# Patient Record
Sex: Male | Born: 1965 | Hispanic: No | Marital: Married | Smoking: Former smoker
Health system: Southern US, Community
[De-identification: ages and names within clinical notes are randomized; demographics above are authoritative.]

## PROBLEM LIST (undated history)

## (undated) DIAGNOSIS — I2510 Atherosclerotic heart disease of native coronary artery without angina pectoris: Secondary | ICD-10-CM

## (undated) DIAGNOSIS — E7800 Pure hypercholesterolemia, unspecified: Secondary | ICD-10-CM

## (undated) DIAGNOSIS — E079 Disorder of thyroid, unspecified: Secondary | ICD-10-CM

## (undated) DIAGNOSIS — I219 Acute myocardial infarction, unspecified: Secondary | ICD-10-CM

## (undated) DIAGNOSIS — K611 Rectal abscess: Secondary | ICD-10-CM

## (undated) DIAGNOSIS — B009 Herpesviral infection, unspecified: Secondary | ICD-10-CM

## (undated) DIAGNOSIS — J329 Chronic sinusitis, unspecified: Secondary | ICD-10-CM

## (undated) DIAGNOSIS — L0291 Cutaneous abscess, unspecified: Secondary | ICD-10-CM

## (undated) DIAGNOSIS — I10 Essential (primary) hypertension: Secondary | ICD-10-CM

## (undated) DIAGNOSIS — N529 Male erectile dysfunction, unspecified: Secondary | ICD-10-CM

## (undated) DIAGNOSIS — F17200 Nicotine dependence, unspecified, uncomplicated: Secondary | ICD-10-CM

## (undated) DIAGNOSIS — E049 Nontoxic goiter, unspecified: Secondary | ICD-10-CM

## (undated) DIAGNOSIS — G8929 Other chronic pain: Secondary | ICD-10-CM

## (undated) DIAGNOSIS — M549 Dorsalgia, unspecified: Secondary | ICD-10-CM

## (undated) HISTORY — PX: ARM HARDWARE REMOVAL: SUR1122

## (undated) HISTORY — DX: Acute myocardial infarction, unspecified: I21.9

## (undated) HISTORY — DX: Rectal abscess: K61.1

## (undated) HISTORY — DX: Male erectile dysfunction, unspecified: N52.9

## (undated) HISTORY — DX: Nontoxic goiter, unspecified: E04.9

## (undated) HISTORY — DX: Nicotine dependence, unspecified, uncomplicated: F17.200

## (undated) HISTORY — PX: PAROTIDECTOMY: SUR1003

## (undated) HISTORY — DX: Cutaneous abscess, unspecified: L02.91

## (undated) HISTORY — DX: Atherosclerotic heart disease of native coronary artery without angina pectoris: I25.10

## (undated) HISTORY — PX: NECK SURGERY: SHX720

## (undated) HISTORY — DX: Herpesviral infection, unspecified: B00.9

---

## 2002-12-25 ENCOUNTER — Encounter: Payer: Self-pay | Admitting: General Surgery

## 2002-12-25 ENCOUNTER — Encounter (INDEPENDENT_AMBULATORY_CARE_PROVIDER_SITE_OTHER): Payer: Self-pay | Admitting: Specialist

## 2002-12-25 ENCOUNTER — Ambulatory Visit (HOSPITAL_COMMUNITY): Admission: RE | Admit: 2002-12-25 | Discharge: 2002-12-25 | Payer: Self-pay | Admitting: General Surgery

## 2003-03-31 ENCOUNTER — Ambulatory Visit (HOSPITAL_COMMUNITY): Admission: RE | Admit: 2003-03-31 | Discharge: 2003-03-31 | Payer: Self-pay | Admitting: General Surgery

## 2003-04-08 ENCOUNTER — Ambulatory Visit (HOSPITAL_COMMUNITY): Admission: RE | Admit: 2003-04-08 | Discharge: 2003-04-08 | Payer: Self-pay | Admitting: General Surgery

## 2003-04-08 ENCOUNTER — Encounter (INDEPENDENT_AMBULATORY_CARE_PROVIDER_SITE_OTHER): Payer: Self-pay | Admitting: *Deleted

## 2003-05-10 ENCOUNTER — Observation Stay (HOSPITAL_COMMUNITY): Admission: RE | Admit: 2003-05-10 | Discharge: 2003-05-11 | Payer: Self-pay | Admitting: General Surgery

## 2006-03-19 HISTORY — PX: THYROIDECTOMY, PARTIAL: SHX18

## 2006-11-13 ENCOUNTER — Inpatient Hospital Stay (HOSPITAL_COMMUNITY): Admission: EM | Admit: 2006-11-13 | Discharge: 2006-11-20 | Payer: Self-pay | Admitting: Emergency Medicine

## 2007-07-11 ENCOUNTER — Emergency Department (HOSPITAL_COMMUNITY): Admission: EM | Admit: 2007-07-11 | Discharge: 2007-07-12 | Payer: Self-pay | Admitting: Emergency Medicine

## 2007-10-10 ENCOUNTER — Emergency Department (HOSPITAL_COMMUNITY): Admission: EM | Admit: 2007-10-10 | Discharge: 2007-10-10 | Payer: Self-pay | Admitting: Emergency Medicine

## 2008-10-09 ENCOUNTER — Emergency Department (HOSPITAL_COMMUNITY): Admission: EM | Admit: 2008-10-09 | Discharge: 2008-10-09 | Payer: Self-pay | Admitting: Emergency Medicine

## 2009-04-07 ENCOUNTER — Emergency Department (HOSPITAL_BASED_OUTPATIENT_CLINIC_OR_DEPARTMENT_OTHER): Admission: EM | Admit: 2009-04-07 | Discharge: 2009-04-07 | Payer: Self-pay | Admitting: Emergency Medicine

## 2009-04-07 ENCOUNTER — Ambulatory Visit: Payer: Self-pay | Admitting: Diagnostic Radiology

## 2009-05-22 ENCOUNTER — Emergency Department (HOSPITAL_BASED_OUTPATIENT_CLINIC_OR_DEPARTMENT_OTHER): Admission: EM | Admit: 2009-05-22 | Discharge: 2009-05-22 | Payer: Self-pay | Admitting: Emergency Medicine

## 2009-06-04 ENCOUNTER — Emergency Department (HOSPITAL_COMMUNITY): Admission: EM | Admit: 2009-06-04 | Discharge: 2009-06-05 | Payer: Self-pay | Admitting: Emergency Medicine

## 2009-06-25 ENCOUNTER — Emergency Department (HOSPITAL_COMMUNITY): Admission: EM | Admit: 2009-06-25 | Discharge: 2009-06-25 | Payer: Self-pay | Admitting: Emergency Medicine

## 2009-07-15 ENCOUNTER — Emergency Department (HOSPITAL_BASED_OUTPATIENT_CLINIC_OR_DEPARTMENT_OTHER): Admission: EM | Admit: 2009-07-15 | Discharge: 2009-07-15 | Payer: Self-pay | Admitting: Emergency Medicine

## 2009-07-21 ENCOUNTER — Ambulatory Visit: Payer: Self-pay | Admitting: Family

## 2009-07-21 DIAGNOSIS — Z8639 Personal history of other endocrine, nutritional and metabolic disease: Secondary | ICD-10-CM | POA: Insufficient documentation

## 2009-07-21 DIAGNOSIS — Z862 Personal history of diseases of the blood and blood-forming organs and certain disorders involving the immune mechanism: Secondary | ICD-10-CM

## 2009-07-21 DIAGNOSIS — M546 Pain in thoracic spine: Secondary | ICD-10-CM

## 2009-08-20 ENCOUNTER — Emergency Department (HOSPITAL_COMMUNITY): Admission: EM | Admit: 2009-08-20 | Discharge: 2009-08-20 | Payer: Self-pay | Admitting: Emergency Medicine

## 2009-10-07 ENCOUNTER — Emergency Department (HOSPITAL_BASED_OUTPATIENT_CLINIC_OR_DEPARTMENT_OTHER): Admission: EM | Admit: 2009-10-07 | Discharge: 2009-10-07 | Payer: Self-pay | Admitting: Emergency Medicine

## 2009-11-06 ENCOUNTER — Emergency Department (HOSPITAL_BASED_OUTPATIENT_CLINIC_OR_DEPARTMENT_OTHER): Admission: EM | Admit: 2009-11-06 | Discharge: 2009-11-06 | Payer: Self-pay | Admitting: Emergency Medicine
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
## 2010-06-11 LAB — POCT I-STAT, CHEM 8
BUN: 25 mg/dL — ABNORMAL HIGH (ref 6–23)
Calcium, Ion: 1.12 mmol/L (ref 1.12–1.32)
Chloride: 102 meq/L (ref 96–112)
Creatinine, Ser: 0.9 mg/dL (ref 0.4–1.5)
Glucose, Bld: 88 mg/dL (ref 70–99)
HCT: 48 % (ref 39.0–52.0)
Hemoglobin: 16.3 g/dL (ref 13.0–17.0)
Potassium: 3 meq/L — ABNORMAL LOW (ref 3.5–5.1)
Sodium: 139 meq/L (ref 135–145)
TCO2: 31 mmol/L (ref 0–100)
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
## 2010-12-29 LAB — BASIC METABOLIC PANEL
Chloride: 97
GFR calc non Af Amer: >60
Glucose, Bld: 100 — ABNORMAL HIGH
Potassium: 3 — ABNORMAL LOW
Sodium: 136

## 2010-12-29 LAB — DIFFERENTIAL
Basophils Relative: 1
Lymphocytes Relative: 19
Monocytes Relative: 5
Neutro Abs: 10.2 — ABNORMAL HIGH
Neutrophils Relative %: 75

## 2010-12-29 LAB — CBC
HCT: 38.6 — ABNORMAL LOW
Hemoglobin: 13.3
MCHC: 34.1
RBC: 4.79
RDW: 14.1 — ABNORMAL HIGH
WBC: 10
WBC: 13.7 — ABNORMAL HIGH

## 2011-05-10 ENCOUNTER — Emergency Department (HOSPITAL_BASED_OUTPATIENT_CLINIC_OR_DEPARTMENT_OTHER)
Admission: EM | Admit: 2011-05-10 | Discharge: 2011-05-10 | Disposition: A | Discharge status: Home Health | Payer: No Typology Code available for payment source | Attending: Emergency Medicine | Admitting: Emergency Medicine

## 2011-05-10 ENCOUNTER — Encounter (HOSPITAL_BASED_OUTPATIENT_CLINIC_OR_DEPARTMENT_OTHER): Payer: Self-pay

## 2011-05-10 ENCOUNTER — Emergency Department (INDEPENDENT_AMBULATORY_CARE_PROVIDER_SITE_OTHER): Payer: No Typology Code available for payment source

## 2011-05-10 DIAGNOSIS — R221 Localized swelling, mass and lump, neck: Secondary | ICD-10-CM

## 2011-05-10 DIAGNOSIS — H5710 Ocular pain, unspecified eye: Secondary | ICD-10-CM

## 2011-05-10 DIAGNOSIS — J328 Other chronic sinusitis: Secondary | ICD-10-CM

## 2011-05-10 DIAGNOSIS — F17200 Nicotine dependence, unspecified, uncomplicated: Secondary | ICD-10-CM | POA: Insufficient documentation

## 2011-05-10 DIAGNOSIS — I10 Essential (primary) hypertension: Secondary | ICD-10-CM | POA: Insufficient documentation

## 2011-05-10 DIAGNOSIS — R51 Headache: Secondary | ICD-10-CM | POA: Insufficient documentation

## 2011-05-10 DIAGNOSIS — Y92410 Unspecified street and highway as the place of occurrence of the external cause: Secondary | ICD-10-CM | POA: Insufficient documentation

## 2011-05-10 DIAGNOSIS — T148XXA Other injury of unspecified body region, initial encounter: Secondary | ICD-10-CM

## 2011-05-10 DIAGNOSIS — E7800 Pure hypercholesterolemia, unspecified: Secondary | ICD-10-CM | POA: Insufficient documentation

## 2011-05-10 HISTORY — DX: Essential (primary) hypertension: I10

## 2011-05-10 HISTORY — DX: Pure hypercholesterolemia, unspecified: E78.00
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
## 2011-05-10 NOTE — Discharge Instructions (Signed)
Contusion A contusion is a deep bruise. Bruises happen when an injury causes bleeding under the skin. Signs of bruising include pain, puffiness (swelling), and discolored skin. The bruise may turn blue, purple, or yellow. HOME CARE   Rest the injured area until the pain and puffiness are better.   Try to limit use of the injured area as much as possible or as told by your doctor.   Put ice on the injured area.   Put ice in a plastic bag.   Place a towel between your skin and the bag.   Leave the ice on for 15 to 20 minutes, 3 to 4 times a day.   Raise (elevate) the injured area above the level of the heart.   Use an elastic bandage to lessen puffiness and motion.   Only take medicine as told by your doctor.   Eat healthy.   See your doctor for a follow-up visit.  GET HELP RIGHT AWAY IF:   There is more redness, puffiness, or pain.   You have a headache, muscle ache, or you feel dizzy and ill.   You have a fever.   The pain is not controlled with medicine.   The bruise is not getting better.   There is yellowish white fluid (pus) coming from the wound.   You lose feeling (numbness) in the injured area.   The bruised area feels cold.   There are new problems.  MAKE SURE YOU:   Understand these instructions.   Will watch your condition.   Will get help right away if you are not doing well or get worse.  Document Released: 08/22/2007 Document Revised: 11/15/2010 Document Reviewed: 08/22/2007 [employer] Patient Information 2012 [employer]. 

## 2012-03-04 ENCOUNTER — Other Ambulatory Visit: Payer: Self-pay | Admitting: Physician Assistant

## 2012-03-05 ENCOUNTER — Ambulatory Visit: Payer: Self-pay | Admitting: Emergency Medicine

## 2012-03-05 VITALS — BP 132/100 | HR 84 | Temp 99.0°F | Resp 20 | Ht 71.0 in | Wt 207.2 lb

## 2012-03-05 DIAGNOSIS — A6000 Herpesviral infection of urogenital system, unspecified: Secondary | ICD-10-CM

## 2012-03-05 MED ORDER — VALACYCLOVIR HCL 1 G PO TABS: 1000.0000 mg | ORAL_TABLET | Freq: Every day | ORAL | Status: DC

## 2012-03-05 NOTE — Progress Notes (Signed)
[facility] [address] [place] [address] [phone]  Date:  03/05/2012   Name:  Corey Hale   DOB:  Feb 28, 1966   MRN:  [id number]  PCP:  No primary provider on file.    Chief Complaint: Medication Refill   History of Present Illness:  Corey Hale is a 46 y.o. very pleasant male patient who presents with the following:  20 year history of genital herpes.  Has been on medication intermittently and has been off for over a year.  Now has itching on his penis.  No overt eruption.  No urethral discharge or rash.  No other complaints.  Patient Active Problem List  Diagnosis  . BACK PAIN  . THYROID NODULE, HX OF    Past Medical History  Diagnosis Date  . Hypertension   . Hypercholesteremia     History reviewed. No pertinent past surgical history.  History  Substance Use Topics  . Smoking status: Current Some Day Smoker    Types: Cigarettes  . Smokeless tobacco: Not on file  . Alcohol Use: No    No family history on file.  No Known Allergies  Medication list has been reviewed and updated.  Current Outpatient Prescriptions on File Prior to Visit  Medication Sig Dispense Refill  . atenolol-chlorthalidone (TENORETIC) 50-25 MG per tablet Take 1 tablet by mouth daily.      Marland Kitchen oxyCODONE-acetaminophen (PERCOCET) 10-325 MG per tablet Take 1 tablet by mouth every 6 (six) hours as needed. For pain        Review of Systems:  As per HPI, otherwise negative.    Physical Examination: Filed Vitals:   03/05/12 1624  BP: 132/100  Pulse: 84  Temp: 99 F (37.2 C)  Resp: 20   Filed Vitals:   03/05/12 1624  Height: 5\' 11"  (1.803 m)  Weight: 207 lb 3.2 oz (93.985 kg)   Body mass index is 28.90 kg/(m^2). Ideal Body Weight: Weight in (lb) to have BMI = 25: 178.9    GEN: WDWN, NAD, Non-toxic, Alert & Oriented x 3 HEENT: Atraumatic, Normocephalic.  Ears and Nose: No external deformity. EXTR: No clubbing/cyanosis/edema NEURO: Normal gait.   PSYCH: Normally interactive. Conversant. Not depressed or anxious appearing.  Calm demeanor.  Genitalia:  Normal male  Assessment and Plan: Genital herpes Valtrex Follow up as needed  Carmelina Dane, MD

## 2012-03-05 NOTE — Telephone Encounter (Signed)
Chart pulled to PA pool at nurses station DOS 12/19/10

## 2012-06-10 ENCOUNTER — Encounter (HOSPITAL_COMMUNITY): Payer: Self-pay

## 2012-06-10 ENCOUNTER — Emergency Department (HOSPITAL_COMMUNITY)
Admission: EM | Admit: 2012-06-10 | Discharge: 2012-06-10 | Disposition: A | Discharge status: Home / Self Care | Payer: No Typology Code available for payment source | Source: Home / Self Care | Attending: Family Medicine | Admitting: Family Medicine

## 2012-06-10 DIAGNOSIS — Z8639 Personal history of other endocrine, nutritional and metabolic disease: Secondary | ICD-10-CM

## 2012-06-10 DIAGNOSIS — N529 Male erectile dysfunction, unspecified: Secondary | ICD-10-CM | POA: Diagnosis present

## 2012-06-10 DIAGNOSIS — E785 Hyperlipidemia, unspecified: Secondary | ICD-10-CM | POA: Diagnosis present

## 2012-06-10 DIAGNOSIS — Z720 Tobacco use: Secondary | ICD-10-CM | POA: Diagnosis present

## 2012-06-10 DIAGNOSIS — I10 Essential (primary) hypertension: Secondary | ICD-10-CM | POA: Diagnosis present

## 2012-06-10 DIAGNOSIS — K029 Dental caries, unspecified: Secondary | ICD-10-CM | POA: Diagnosis present

## 2012-06-10 DIAGNOSIS — F17200 Nicotine dependence, unspecified, uncomplicated: Secondary | ICD-10-CM

## 2012-06-10 HISTORY — DX: Disorder of thyroid, unspecified: E07.9

## 2012-06-10 LAB — HEMOGLOBIN A1C
Hgb A1c MFr Bld: 5.7 % — ABNORMAL HIGH (ref <5.7)
Mean Plasma Glucose: 117 mg/dL — ABNORMAL HIGH (ref <117)

## 2012-06-10 LAB — CHOLESTEROL, TOTAL: Cholesterol: 178 mg/dL (ref 0–200)

## 2012-06-10 LAB — CBC
HCT: 42.1 % (ref 39.0–52.0)
Hemoglobin: 14.6 g/dL (ref 13.0–17.0)
MCH: 29.1 pg (ref 26.0–34.0)
MCHC: 34.7 g/dL (ref 30.0–36.0)
RDW: 13.6 % (ref 11.5–15.5)

## 2012-06-10 LAB — TSH: TSH: 1.392 u[IU]/mL (ref 0.350–4.500)

## 2012-06-10 LAB — COMPREHENSIVE METABOLIC PANEL
BUN: 12 mg/dL (ref 6–23)
Calcium: 9.7 mg/dL (ref 8.4–10.5)
GFR calc Af Amer: >90 mL/min (ref >90)
Glucose, Bld: 112 mg/dL — ABNORMAL HIGH (ref 70–99)
Total Protein: 8.2 g/dL (ref 6.0–8.3)

## 2012-06-10 MED ORDER — ATENOLOL-CHLORTHALIDONE 50-25 MG PO TABS: 1.0000 | ORAL_TABLET | Freq: Every day | ORAL | Status: DC

## 2012-06-10 MED ORDER — PRAVASTATIN SODIUM 10 MG PO TABS: 10.0000 mg | ORAL_TABLET | Freq: Every day | ORAL | Status: DC

## 2012-06-10 MED ORDER — SILDENAFIL CITRATE 50 MG PO TABS: 50.0000 mg | ORAL_TABLET | Freq: Every day | ORAL | Status: DC | PRN

## 2012-06-10 NOTE — ED Notes (Signed)
Patient here to establish himself with primary doctor

## 2012-06-10 NOTE — ED Provider Notes (Signed)
History     CSN: [id number]  Arrival date & time 06/10/12  1550   First MD Initiated Contact with Patient 06/10/12 1646      Chief Complaint  Patient presents with  . Establish Care   (Consider location/radiation/quality/duration/timing/severity/associated sxs/prior treatment) HPI Pt presenting to establish care  Past Medical History  Diagnosis Date  . History of goiter  Hypertension Erectile Dysfunction Tobacco User    Pt says that he needs to see a dentist.  He says that he is having occasional erection dysfunction and has been taking viagra in the past with some success and asking for refill.  He also needs refills for his medications for his blood pressure and cholesterol.  Pt says that he has no chest pain or shortness of breath.    History reviewed. No pertinent past surgical history. Past Medical History  Diagnosis Date  . Thyroid disease    No family history on file.  History  Substance Use Topics  . Smoking status: Light Tobacco Smoker  . Smokeless tobacco: Not on file  . Alcohol Use: No    Review of Systems  Genitourinary: Negative for dysuria, frequency, discharge, penile swelling, scrotal swelling, genital sores, penile pain and testicular pain.  All other systems reviewed and are negative.    Allergies  Review of patient's allergies indicates no known allergies.  Home Medications   Current Outpatient Rx  Name  Route  Sig  Dispense  Refill  . atenolol-chlorthalidone (TENORETIC) 50-25 MG per tablet   Oral   Take 1 tablet by mouth daily.         . pravastatin (PRAVACHOL) 10 MG tablet   Oral   Take 10 mg by mouth daily.           BP 120/85  Pulse 73  Temp(Src) 97.8 F (36.6 C) (Oral)  SpO2 97%  Physical Exam  Nursing note and vitals reviewed. Constitutional: He is oriented to person, place, and time. He appears well-developed and well-nourished. No distress.  HENT:  Head: Normocephalic and atraumatic.  Nose: Nose normal.   Mouth/Throat: Oropharynx is clear and moist. No oropharyngeal exudate.  Eyes: Conjunctivae and EOM are normal. Pupils are equal, round, and reactive to light.  Neck: Normal range of motion. Neck supple. No JVD present. No tracheal deviation present. No thyromegaly present.  Cardiovascular: Normal rate, regular rhythm and normal heart sounds.   No murmur heard. Pulmonary/Chest: Effort normal and breath sounds normal.  Abdominal: Soft. Bowel sounds are normal. He exhibits no distension and no mass. There is no tenderness. There is no rebound and no guarding.  Musculoskeletal: Normal range of motion. He exhibits no edema and no tenderness.  Lymphadenopathy:    He has no cervical adenopathy.  Neurological: He is alert and oriented to person, place, and time. No cranial nerve deficit.  Skin: Skin is warm and dry. No rash noted. No erythema. No pallor.  Psychiatric: He has a normal mood and affect. His behavior is normal. Judgment and thought content normal.    ED Course  Procedures (including critical care time)  Labs Reviewed - No data to display No results found.   No diagnosis found.   MDM  IMPRESSION  Hypertension  Hyperipidemia  Erectile dysfunction, controlled with viagra prn  Large sebaceous cyst on right jaw  Dental caries  Tobacco use   RECOMMENDATIONS / PLAN Check labs today Refilled regular medications today Pt declined flu vaccine The patient was counseled on the dangers of tobacco use, and  was advised to quit.  Reviewed strategies to maximize success, including removing cigarettes and smoking materials from environment, stress management, substitution of other forms of reinforcement, support of family/friends and written materials.  Referral to general plastic surgery for removal of large sebaceous cyst right jaw Referral for dental care  FOLLOW UP 3 months for follow up   The patient was given clear instructions to go to ER or return to medical center if  symptoms don't improve, worsen or new problems develop.  The patient verbalized understanding.  The patient was told to call to get lab results if they haven't heard anything in the next week.    Addendum:  I received a call about pt's potassium being low.  Will prescribe for patient to start taking potassium KCL 20 meq po daily.  Follow up for repeat BMP in 1 week.    Results for orders placed during the hospital encounter of 06/10/12  CBC      Result Value Range   WBC 8.1  4.0 - 10.5 K/uL   RBC 5.02  4.22 - 5.81 MIL/uL   Hemoglobin 14.6  13.0 - 17.0 g/dL   HCT 40.9  81.1 - 91.4 %   MCV 83.9  78.0 - 100.0 fL   MCH 29.1  26.0 - 34.0 pg   MCHC 34.7  30.0 - 36.0 g/dL   RDW 78.2  95.6 - 21.3 %   Platelets 194  150 - 400 K/uL  COMPREHENSIVE METABOLIC PANEL      Result Value Range   Sodium 139  135 - 145 mEq/L   Potassium 2.6 (*) 3.5 - 5.1 mEq/L   Chloride 99  96 - 112 mEq/L   CO2 29  19 - 32 mEq/L   Glucose, Bld 112 (*) 70 - 99 mg/dL   BUN 12  6 - 23 mg/dL   Creatinine, Ser 0.86  0.50 - 1.35 mg/dL   Calcium 9.7  8.4 - 57.8 mg/dL   Total Protein 8.2  6.0 - 8.3 g/dL   Albumin 4.0  3.5 - 5.2 g/dL   AST 17  0 - 37 U/L   ALT 19  0 - 53 U/L   Alkaline Phosphatase 81  39 - 117 U/L   Total Bilirubin 0.4  0.3 - 1.2 mg/dL   GFR calc non Af Amer >90  >90 mL/min   GFR calc Af Amer >90  >90 mL/min  CHOLESTEROL, TOTAL      Result Value Range   Cholesterol 178  0 - 200 mg/dL  HEMOGLOBIN I6N      Result Value Range   Hemoglobin A1C 5.7 (*) <5.7 %   Mean Plasma Glucose 117 (*) <117 mg/dL  TSH      Result Value Range   TSH 1.392  0.350 - 4.500 uIU/mL  VITAMIN D 25 HYDROXY      Result Value Range   Vit D, 25-Hydroxy 33  30 - 89 ng/mL  VITAMIN B12      Result Value Range   Vitamin B-12 492  211 - 911 pg/mL           Yarelli Decelles Cyndie Mull, MD 06/11/12 [phone]

## 2012-06-11 ENCOUNTER — Telehealth (HOSPITAL_COMMUNITY): Payer: Self-pay

## 2012-06-11 NOTE — ED Notes (Signed)
Spoke with patient and gave him his lab results Prescription for kcl called into [facility] on [address] KCL 20 meq- take 1 po BID for 3 days  Than take 1 po daily #30 with three refills

## 2012-06-11 NOTE — Progress Notes (Signed)
Quick Note:  Please inform patient that his potassium level is low. He needs to take potassium everyday with his blood pressure medication. Please call in KCL 20 meq - take 1 po bid for 3 days, then take 1 po daily, #30 tabs, RFx3, Have him return to clinic in 1 week to repeat BMP and magnesium level. Pt has prediabetes. Please mail him some information on diabetes diet and physical activity. Please tell patient that his other labs came back OK. He needs to have his labs redone in 3 months but must return next week to get the labs mentioned above.   Corey Langton, MD, CDE, FAAFP [facility] [place], [place]   ______

## 2012-06-12 ENCOUNTER — Encounter (HOSPITAL_COMMUNITY): Payer: Self-pay

## 2012-06-12 NOTE — ED Notes (Signed)
Referral faxed to plastic surgeon/general sugeon for cyst on face

## 2012-06-12 NOTE — ED Notes (Signed)
Referral faxed to [facility] waiting for an appt 

## 2012-09-08 ENCOUNTER — Emergency Department (HOSPITAL_BASED_OUTPATIENT_CLINIC_OR_DEPARTMENT_OTHER)
Admission: EM | Admit: 2012-09-08 | Discharge: 2012-09-08 | Disposition: A | Discharge status: Home / Self Care | Payer: No Typology Code available for payment source | Attending: Emergency Medicine | Admitting: Emergency Medicine

## 2012-09-08 ENCOUNTER — Encounter (HOSPITAL_BASED_OUTPATIENT_CLINIC_OR_DEPARTMENT_OTHER): Payer: Self-pay

## 2012-09-08 ENCOUNTER — Emergency Department (HOSPITAL_BASED_OUTPATIENT_CLINIC_OR_DEPARTMENT_OTHER): Payer: No Typology Code available for payment source

## 2012-09-08 ENCOUNTER — Telehealth (HOSPITAL_COMMUNITY): Payer: Self-pay | Admitting: Emergency Medicine

## 2012-09-08 DIAGNOSIS — E7800 Pure hypercholesterolemia, unspecified: Secondary | ICD-10-CM | POA: Insufficient documentation

## 2012-09-08 DIAGNOSIS — Z862 Personal history of diseases of the blood and blood-forming organs and certain disorders involving the immune mechanism: Secondary | ICD-10-CM | POA: Insufficient documentation

## 2012-09-08 DIAGNOSIS — F17200 Nicotine dependence, unspecified, uncomplicated: Secondary | ICD-10-CM | POA: Insufficient documentation

## 2012-09-08 DIAGNOSIS — Z79899 Other long term (current) drug therapy: Secondary | ICD-10-CM | POA: Insufficient documentation

## 2012-09-08 DIAGNOSIS — J209 Acute bronchitis, unspecified: Secondary | ICD-10-CM | POA: Insufficient documentation

## 2012-09-08 DIAGNOSIS — I10 Essential (primary) hypertension: Secondary | ICD-10-CM | POA: Insufficient documentation

## 2012-09-08 DIAGNOSIS — Z8639 Personal history of other endocrine, nutritional and metabolic disease: Secondary | ICD-10-CM | POA: Insufficient documentation

## 2012-09-08 DIAGNOSIS — J40 Bronchitis, not specified as acute or chronic: Secondary | ICD-10-CM

## 2012-09-08 MED ORDER — AZITHROMYCIN 250 MG PO TABS: ORAL_TABLET | ORAL | Status: DC

## 2012-09-08 MED ORDER — ALBUTEROL SULFATE HFA 108 (90 BASE) MCG/ACT IN AERS
2.0000 | INHALATION_SPRAY | RESPIRATORY_TRACT | Status: DC | PRN
  Administered 2012-09-08: 2 via RESPIRATORY_TRACT
  Filled 2012-09-08: qty 6.7

## 2012-09-08 MED ORDER — DEXAMETHASONE 4 MG PO TABS
10.0000 mg | ORAL_TABLET | Freq: Once | ORAL | Status: AC
  Administered 2012-09-08: 10 mg via ORAL
  Filled 2012-09-08: qty 3

## 2012-09-08 MED ORDER — IPRATROPIUM BROMIDE 0.02 % IN SOLN
0.5000 mg | Freq: Once | RESPIRATORY_TRACT | Status: AC
  Administered 2012-09-08: 0.5 mg via RESPIRATORY_TRACT
  Filled 2012-09-08: qty 2.5

## 2012-09-08 MED ORDER — AEROCHAMBER PLUS W/MASK MISC
1.0000 | Freq: Once | Status: DC
  Filled 2012-09-08: qty 1

## 2012-09-08 MED ORDER — ALBUTEROL SULFATE (5 MG/ML) 0.5% IN NEBU
5.0000 mg | INHALATION_SOLUTION | Freq: Once | RESPIRATORY_TRACT | Status: AC
  Administered 2012-09-08: 5 mg via RESPIRATORY_TRACT
  Filled 2012-09-08: qty 1

## 2012-09-08 NOTE — ED Notes (Signed)
Patient here with cough and congestion x 2 days. Reports that he has noticed wheezing with same, no distress. Smoker. Cough worse when lying down. Dry cough on arrival

## 2012-09-08 NOTE — ED Provider Notes (Signed)
History     CSN: [id number]  Arrival date & time 09/08/12  0355   First MD Initiated Contact with Patient 09/08/12 [phone]      Chief Complaint  Patient presents with  . Cough    (Consider location/radiation/quality/duration/timing/severity/associated sxs/prior treatment) HPI This is a 47 year old smoker who complains of a two-day history of cough, productive of clear sputum and wheezing. The symptoms are worse when lying flat and improved with sitting upright. They're not worsened by exertion. Is not aware of having a fever. He denies nausea, vomiting or diarrhea. He denies past history of wheezing. The symptoms are moderate.  He has a mass of the right cheek that he states has been present and stable for the past 5 years.  Past Medical History  Diagnosis Date  . Hypertension   . Hypercholesteremia   . Thyroid disease     History reviewed. No pertinent past surgical history.  No family history on file.  History  Substance Use Topics  . Smoking status: Light Tobacco Smoker  . Smokeless tobacco: Not on file  . Alcohol Use: No      Review of Systems  All other systems reviewed and are negative.    Allergies  Review of patient's allergies indicates no known allergies.  Home Medications   Current Outpatient Rx  Name  Route  Sig  Dispense  Refill  . atenolol-chlorthalidone (TENORETIC) 50-25 MG per tablet   Oral   Take 1 tablet by mouth daily.         Marland Kitchen atenolol-chlorthalidone (TENORETIC) 50-25 MG per tablet   Oral   Take 1 tablet by mouth daily.   30 tablet   3   . oxyCODONE-acetaminophen (PERCOCET) 10-325 MG per tablet   Oral   Take 1 tablet by mouth every 6 (six) hours as needed. For pain         . pravastatin (PRAVACHOL) 10 MG tablet   Oral   Take 1 tablet (10 mg total) by mouth daily.   30 tablet   3   . sildenafil (VIAGRA) 50 MG tablet   Oral   Take 1 tablet (50 mg total) by mouth daily as needed for erectile dysfunction.   10 tablet   0    . valACYclovir (VALTREX) 1000 MG tablet   Oral   Take 1 tablet (1,000 mg total) by mouth daily.   30 tablet   12     BP 123/86  Pulse 100  Temp(Src) 99.1 F (37.3 C) (Oral)  Resp 16  Wt 204 lb (92.534 kg)  BMI 28.46 kg/m2  SpO2 97%  Physical Exam General: Well-developed, well-nourished male in no acute distress; appearance consistent with age of record HENT: normocephalic, atraumatic; nontender, solid, motile mass overlying right parotid gland Eyes: pupils equal round and reactive to light; extraocular muscles intact Neck: supple Heart: regular rate and rhythm Lungs: Wheezing on expiration; frequent cough Abdomen: soft; nondistended; nontender; bowel sounds present Extremities: No deformity; full range of motion; pulses normal; no edema Neurologic: Awake, alert and oriented; motor function intact in all extremities and symmetric; no facial droop Skin: Warm and dry Psychiatric: Normal mood and affect    ED Course  Procedures (including critical care time)     MDM  Nursing notes and vitals signs, including pulse oximetry, reviewed.  Summary of this visit's results, reviewed by myself:  Imaging Studies: Dg Chest 2 View  09/08/2012   *RADIOLOGY REPORT*  Clinical Data: Cough, congestion.  CHEST - 2 VIEW  Comparison: None.  Findings: Heart and mediastinal contours are within normal limits. No focal opacities or effusions.  No acute bony abnormality.  IMPRESSION: No active cardiopulmonary disease.   Original Report Authenticated By: Charlett Nose, M.D.    5:25 AM Air movement improved, patient feels better though wheezing persists after albuterol and Atrovent treatment. Due to productive cough and low-grade fever we will treat him for bronchitis. He was advised to stop smoking.       Hanley Seamen, MD 09/08/12 [phone]

## 2012-12-11 ENCOUNTER — Ambulatory Visit: Payer: Self-pay | Attending: Internal Medicine

## 2013-05-18 ENCOUNTER — Ambulatory Visit: Payer: No Typology Code available for payment source | Attending: Internal Medicine

## 2013-08-05 ENCOUNTER — Ambulatory Visit: Payer: Self-pay | Admitting: Emergency Medicine

## 2013-08-05 VITALS — BP 120/82 | HR 115 | Temp 97.8°F | Resp 18 | Ht 71.0 in | Wt 197.0 lb

## 2013-08-05 DIAGNOSIS — R220 Localized swelling, mass and lump, head: Secondary | ICD-10-CM

## 2013-08-05 DIAGNOSIS — R221 Localized swelling, mass and lump, neck: Secondary | ICD-10-CM

## 2013-08-05 DIAGNOSIS — G894 Chronic pain syndrome: Secondary | ICD-10-CM

## 2013-08-05 MED ORDER — OXYCODONE HCL 15 MG PO TABS: 15.0000 mg | ORAL_TABLET | Freq: Four times a day (QID) | ORAL | Status: DC | PRN

## 2013-08-05 NOTE — Addendum Note (Signed)
Addended by: Roselee Culver on: 08/05/2013 02:08 PM   Modules accepted: Orders
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
## 2013-08-11 ENCOUNTER — Telehealth: Payer: Self-pay | Admitting: Physician Assistant

## 2013-08-11 NOTE — Telephone Encounter (Signed)
Got a call from Clarissa from [facility] about his referral to plastic surgery.  She states that he has insurance through the [employer] card - [employer] and they do not participate unless  It has been approved and she wanted to know whether it had been approved or not.  I spoke with Olen Pel and she stated that the patient needs to be referred from the PCP on his orange card.  I have spoke with Joann about the orange card and he has an appt today and she will contact the patient and see if he wants to pay out of pocket or wait and get referral through his PCP on his orange card.

## 2013-09-07 ENCOUNTER — Ambulatory Visit: Payer: No Typology Code available for payment source | Attending: Internal Medicine | Admitting: Internal Medicine

## 2013-09-07 ENCOUNTER — Encounter: Payer: Self-pay | Admitting: Internal Medicine

## 2013-09-07 VITALS — BP 121/87 | HR 75 | Temp 98.3°F | Resp 16 | Ht 73.0 in | Wt 192.0 lb

## 2013-09-07 DIAGNOSIS — H9193 Unspecified hearing loss, bilateral: Secondary | ICD-10-CM

## 2013-09-07 DIAGNOSIS — I10 Essential (primary) hypertension: Secondary | ICD-10-CM | POA: Insufficient documentation

## 2013-09-07 DIAGNOSIS — Z0000 Encounter for general adult medical examination without abnormal findings: Secondary | ICD-10-CM | POA: Insufficient documentation

## 2013-09-07 DIAGNOSIS — D170 Benign lipomatous neoplasm of skin and subcutaneous tissue of head, face and neck: Secondary | ICD-10-CM | POA: Insufficient documentation

## 2013-09-07 DIAGNOSIS — F17200 Nicotine dependence, unspecified, uncomplicated: Secondary | ICD-10-CM | POA: Insufficient documentation

## 2013-09-07 DIAGNOSIS — H9190 Unspecified hearing loss, unspecified ear: Secondary | ICD-10-CM | POA: Insufficient documentation

## 2013-09-07 DIAGNOSIS — D1779 Benign lipomatous neoplasm of other sites: Secondary | ICD-10-CM | POA: Insufficient documentation

## 2013-09-07 NOTE — Patient Instructions (Signed)
Hypertension Hypertension, commonly called high blood pressure, is when the force of blood pumping through your arteries is too strong. Your arteries are the blood vessels that carry blood from your heart throughout your body. A blood pressure reading consists of a higher number over a lower number, such as 110/72. The higher number (systolic) is the pressure inside your arteries when your heart pumps. The lower number (diastolic) is the pressure inside your arteries when your heart relaxes. Ideally you want your blood pressure below 120/80. Hypertension forces your heart to work harder to pump blood. Your arteries may become narrow or stiff. Having hypertension puts you at risk for heart disease, stroke, and other problems.  RISK FACTORS Some risk factors for high blood pressure are controllable. Others are not.  Risk factors you cannot control include:   Race. You may be at higher risk if you are African American.  Age. Risk increases with age.  Gender. Men are at higher risk than women before age 45 years. After age 65, women are at higher risk than men. Risk factors you can control include:  Not getting enough exercise or physical activity.  Being overweight.  Getting too much fat, sugar, calories, or salt in your diet.  Drinking too much alcohol. SIGNS AND SYMPTOMS Hypertension does not usually cause signs or symptoms. Extremely high blood pressure (hypertensive crisis) may cause headache, anxiety, shortness of breath, and nosebleed. DIAGNOSIS  To check if you have hypertension, your health care provider will measure your blood pressure while you are seated, with your arm held at the level of your heart. It should be measured at least twice using the same arm. Certain conditions can cause a difference in blood pressure between your right and left arms. A blood pressure reading that is higher than normal on one occasion does not mean that you need treatment. If one blood pressure reading  is high, ask your health care provider about having it checked again. TREATMENT  Treating high blood pressure includes making lifestyle changes and possibly taking medication. Living a healthy lifestyle can help lower high blood pressure. You may need to change some of your habits. Lifestyle changes may include:  Following the DASH diet. This diet is high in fruits, vegetables, and whole grains. It is low in salt, red meat, and added sugars.  Getting at least 2 1/2 hours of brisk physical activity every week.  Losing weight if necessary.  Not smoking.  Limiting alcoholic beverages.  Learning ways to reduce stress. If lifestyle changes are not enough to get your blood pressure under control, your health care provider may prescribe medicine. You may need to take more than one. Work closely with your health care provider to understand the risks and benefits. HOME CARE INSTRUCTIONS  Have your blood pressure rechecked as directed by your health care provider.   Only take medicine as directed by your health care provider. Follow the directions carefully. Blood pressure medicines must be taken as prescribed. The medicine does not work as well when you skip doses. Skipping doses also puts you at risk for problems.   Do not smoke.   Monitor your blood pressure at home as directed by your health care provider. SEEK MEDICAL CARE IF:   You think you are having a reaction to medicines taken.  You have recurrent headaches or feel dizzy.  You have swelling in your ankles.  You have trouble with your vision. SEEK IMMEDIATE MEDICAL CARE IF:  You develop a severe headache or   confusion.  You have unusual weakness, numbness, or feel faint.  You have severe chest or abdominal pain.  You vomit repeatedly.  You have trouble breathing. MAKE SURE YOU:   Understand these instructions.  Will watch your condition.  Will get help right away if you are not doing well or get  worse. Document Released: 03/05/2005 Document Revised: 03/10/2013 Document Reviewed: 12/26/2012 Exit[employer] Patient Information 2015 [employer]. This information is not intended to replace advice given to you by your health care provider. Make sure you discuss any questions you have with your health care provider.  
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
## 2013-09-07 NOTE — Progress Notes (Signed)
Pt is here to establish care. For 5 years pt hears a hissing sound. Pt has a cyst on his right jaw. Pt has a history of HTN.

## 2013-10-29 ENCOUNTER — Telehealth: Payer: Self-pay | Admitting: Internal Medicine

## 2013-10-29 NOTE — Telephone Encounter (Signed)
Pt needs refill medication for blood pressure at [facility] on [address] Please f/u with Pt.

## 2013-11-03 ENCOUNTER — Other Ambulatory Visit: Payer: Self-pay

## 2013-11-03 ENCOUNTER — Telehealth: Payer: Self-pay

## 2013-11-03 MED ORDER — ATENOLOL-CHLORTHALIDONE 50-25 MG PO TABS: 1.0000 | ORAL_TABLET | Freq: Every day | ORAL | Status: DC

## 2013-11-03 NOTE — Telephone Encounter (Signed)
Patient called requesting refill on his blood pressure medication Tenoretic electronically sent to [facility] on [address]

## 2013-11-24 ENCOUNTER — Ambulatory Visit (INDEPENDENT_AMBULATORY_CARE_PROVIDER_SITE_OTHER): Payer: Self-pay | Admitting: Internal Medicine

## 2013-11-24 VITALS — BP 120/84 | HR 62 | Temp 98.3°F | Resp 20 | Ht 70.0 in | Wt 197.4 lb

## 2013-11-24 DIAGNOSIS — M546 Pain in thoracic spine: Secondary | ICD-10-CM

## 2013-11-24 MED ORDER — OXYCODONE HCL 15 MG PO TABS: 15.0000 mg | ORAL_TABLET | Freq: Four times a day (QID) | ORAL | Status: DC | PRN

## 2013-11-24 NOTE — Progress Notes (Signed)
   Subjective:   Patient ID: Corey Hale, male    DOB: 20-Dec-1965, 48 y.o.   MRN: [id number]  This chart was scribed for Tami Lin, MD by Lowella Petties, ED Scribe. The patient was seen in room 9. Patient's care was started at 8:32 PM.  HPI  HPI Comments: Corey Hale is a 48 y.o. male who presents to [facility] requesting a refill of his pain medication. He reports a history of chronic back pain for which he was prescribed Oxycodone (15 MG) by Dr. Arnoldo Morale. He reports that the major pain is still in his back. He reports that he had his medication refilled here by Dr. Ouida Sills in May. He uses the medication intermittently but needs some medication every day.  He reports that he gets his regular medications at the [facility].  Review of Systems  Noncontributory.   Objective:  Physical Exam In no obvious discomfort BP 120/84  Pulse 62  Temp(Src) 98.3 F (36.8 C) (Oral)  Resp 20  Ht 5\' 10"  (1.778 m)  Wt 197 lb 6 oz (89.529 kg)  BMI 28.32 kg/m2  SpO2 99%  Assessment & Plan:   I have completed the patient encounter in its entirety as documented by the scribe, with editing by me where necessary. Renne Cornick P. Laney Pastor, M.D.  Chronic pain syndrome-Pain in thoracic spine    He is given medications for one month He is asked to continue followup care with a chronic pain clinic and is given 2 outside resources to pursue/he may choose to followup here but needs to stay with his original provider Dr. Ouida Sills for all subsequent controlled substance prescriptions

## 2013-11-24 NOTE — Patient Instructions (Signed)
[facility] Or return to see dr Ouida Sills for regular visits

## 2013-12-14 ENCOUNTER — Ambulatory Visit: Payer: Self-pay | Admitting: Internal Medicine

## 2014-01-01 ENCOUNTER — Ambulatory Visit: Payer: Self-pay

## 2014-02-06 ENCOUNTER — Other Ambulatory Visit: Payer: Self-pay | Admitting: Internal Medicine

## 2014-02-23 ENCOUNTER — Encounter: Payer: Self-pay | Admitting: Internal Medicine

## 2014-02-23 ENCOUNTER — Ambulatory Visit: Payer: Self-pay | Attending: Internal Medicine | Admitting: Internal Medicine

## 2014-02-23 VITALS — BP 131/87 | HR 60 | Temp 97.9°F | Resp 16 | Ht 72.0 in | Wt 205.0 lb

## 2014-02-23 DIAGNOSIS — Z202 Contact with and (suspected) exposure to infections with a predominantly sexual mode of transmission: Secondary | ICD-10-CM | POA: Insufficient documentation

## 2014-02-23 DIAGNOSIS — Z79899 Other long term (current) drug therapy: Secondary | ICD-10-CM | POA: Insufficient documentation

## 2014-02-23 DIAGNOSIS — E785 Hyperlipidemia, unspecified: Secondary | ICD-10-CM | POA: Insufficient documentation

## 2014-02-23 DIAGNOSIS — E079 Disorder of thyroid, unspecified: Secondary | ICD-10-CM | POA: Insufficient documentation

## 2014-02-23 DIAGNOSIS — I10 Essential (primary) hypertension: Secondary | ICD-10-CM | POA: Insufficient documentation

## 2014-02-23 DIAGNOSIS — Z20828 Contact with and (suspected) exposure to other viral communicable diseases: Secondary | ICD-10-CM

## 2014-02-23 DIAGNOSIS — G8929 Other chronic pain: Secondary | ICD-10-CM | POA: Insufficient documentation

## 2014-02-23 DIAGNOSIS — E780 Pure hypercholesterolemia: Secondary | ICD-10-CM | POA: Insufficient documentation

## 2014-02-23 LAB — COMPLETE METABOLIC PANEL WITH GFR
ALK PHOS: 49 U/L (ref 39–117)
ALT: 14 U/L (ref 0–53)
AST: 14 U/L (ref 0–37)
Albumin: 4 g/dL (ref 3.5–5.2)
BILIRUBIN TOTAL: 0.4 mg/dL (ref 0.2–1.2)
BUN: 14 mg/dL (ref 6–23)
CO2: 28 meq/L (ref 19–32)
CREATININE: 0.89 mg/dL (ref 0.50–1.35)
Calcium: 9.7 mg/dL (ref 8.4–10.5)
Chloride: 103 mEq/L (ref 96–112)
GLUCOSE: 86 mg/dL (ref 70–99)
Potassium: 4.8 mEq/L (ref 3.5–5.3)
SODIUM: 140 meq/L (ref 135–145)
TOTAL PROTEIN: 7.2 g/dL (ref 6.0–8.3)

## 2014-02-23 LAB — CBC WITH DIFFERENTIAL/PLATELET
BASOS PCT: 1 % (ref 0–1)
Basophils Absolute: 0.1 10*3/uL (ref 0.0–0.1)
EOS ABS: 0.7 10*3/uL (ref 0.0–0.7)
EOS PCT: 10 % — AB (ref 0–5)
HCT: 42.1 % (ref 39.0–52.0)
HEMOGLOBIN: 14.7 g/dL (ref 13.0–17.0)
LYMPHS ABS: 2.1 10*3/uL (ref 0.7–4.0)
Lymphocytes Relative: 31 % (ref 12–46)
MCH: 28.8 pg (ref 26.0–34.0)
MCHC: 34.9 g/dL (ref 30.0–36.0)
MCV: 82.5 fL (ref 78.0–100.0)
MONOS PCT: 8 % (ref 3–12)
MPV: 11.5 fL (ref 9.4–12.4)
Monocytes Absolute: 0.5 10*3/uL (ref 0.1–1.0)
Neutro Abs: 3.4 10*3/uL (ref 1.7–7.7)
Neutrophils Relative %: 50 % (ref 43–77)
Platelets: 206 10*3/uL (ref 150–400)
RBC: 5.1 MIL/uL (ref 4.22–5.81)
RDW: 13 % (ref 11.5–15.5)
WBC: 6.7 10*3/uL (ref 4.0–10.5)

## 2014-02-23 LAB — LIPID PANEL
Cholesterol: 144 mg/dL (ref 0–200)
HDL: 31 mg/dL — AB (ref >39)
LDL CALC: 91 mg/dL (ref 0–99)
TRIGLYCERIDES: 108 mg/dL (ref <150)
Total CHOL/HDL Ratio: 4.6 Ratio
VLDL: 22 mg/dL (ref 0–40)

## 2014-02-23 LAB — TSH: TSH: 1.418 u[IU]/mL (ref 0.350–4.500)

## 2014-02-23 MED ORDER — ATENOLOL-CHLORTHALIDONE 50-25 MG PO TABS: 1.0000 | ORAL_TABLET | Freq: Every day | ORAL | Status: DC

## 2014-02-23 MED ORDER — TRAMADOL HCL 50 MG PO TABS: 50.0000 mg | ORAL_TABLET | Freq: Three times a day (TID) | ORAL | Status: DC | PRN

## 2014-02-23 MED ORDER — PRAVASTATIN SODIUM 10 MG PO TABS: 10.0000 mg | ORAL_TABLET | Freq: Every day | ORAL | Status: DC

## 2014-02-23 MED ORDER — VALACYCLOVIR HCL 1 G PO TABS: 1000.0000 mg | ORAL_TABLET | Freq: Every day | ORAL | Status: DC

## 2014-02-23 NOTE — Patient Instructions (Signed)
DASH Eating Plan DASH stands for "Dietary Approaches to Stop Hypertension." The DASH eating plan is a healthy eating plan that has been shown to reduce high blood pressure (hypertension). Additional health benefits may include reducing the risk of type 2 diabetes mellitus, heart disease, and stroke. The DASH eating plan may also help with weight loss. WHAT DO I NEED TO KNOW ABOUT THE DASH EATING PLAN? For the DASH eating plan, you will follow these general guidelines:  Choose foods with a percent daily value for sodium of less than 5% (as listed on the food label).  Use salt-free seasonings or herbs instead of table salt or sea salt.  Check with your health care provider or pharmacist before using salt substitutes.  Eat lower-sodium products, often labeled as "lower sodium" or "no salt added."  Eat fresh foods.  Eat more vegetables, fruits, and low-fat dairy products.  Choose whole grains. Look for the word "whole" as the first word in the ingredient list.  Choose fish and skinless chicken or turkey more often than red meat. Limit fish, poultry, and meat to 6 oz (170 g) each day.  Limit sweets, desserts, sugars, and sugary drinks.  Choose heart-healthy fats.  Limit cheese to 1 oz (28 g) per day.  Eat more home-cooked food and less restaurant, buffet, and fast food.  Limit fried foods.  Cook foods using methods other than frying.  Limit canned vegetables. If you do use them, rinse them well to decrease the sodium.  When eating at a restaurant, ask that your food be prepared with less salt, or no salt if possible. WHAT FOODS CAN I EAT? Seek help from a dietitian for individual calorie needs. Grains Whole grain or whole wheat bread. Brown rice. Whole grain or whole wheat pasta. Quinoa, bulgur, and whole grain cereals. Low-sodium cereals. Corn or whole wheat flour tortillas. Whole grain cornbread. Whole grain crackers. Low-sodium crackers. Vegetables Fresh or frozen vegetables  (raw, steamed, roasted, or grilled). Low-sodium or reduced-sodium tomato and vegetable juices. Low-sodium or reduced-sodium tomato sauce and paste. Low-sodium or reduced-sodium canned vegetables.  Fruits All fresh, canned (in natural juice), or frozen fruits. Meat and Other Protein Products Ground beef (85% or leaner), grass-fed beef, or beef trimmed of fat. Skinless chicken or turkey. Ground chicken or turkey. Pork trimmed of fat. All fish and seafood. Eggs. Dried beans, peas, or lentils. Unsalted nuts and seeds. Unsalted canned beans. Dairy Low-fat dairy products, such as skim or 1% milk, 2% or reduced-fat cheeses, low-fat ricotta or cottage cheese, or plain low-fat yogurt. Low-sodium or reduced-sodium cheeses. Fats and Oils Tub margarines without trans fats. Light or reduced-fat mayonnaise and salad dressings (reduced sodium). Avocado. Safflower, olive, or canola oils. Natural peanut or almond butter. Other Unsalted popcorn and pretzels. The items listed above may not be a complete list of recommended foods or beverages. Contact your dietitian for more options. WHAT FOODS ARE NOT RECOMMENDED? Grains White bread. White pasta. White rice. Refined cornbread. Bagels and croissants. Crackers that contain trans fat. Vegetables Creamed or fried vegetables. Vegetables in a cheese sauce. Regular canned vegetables. Regular canned tomato sauce and paste. Regular tomato and vegetable juices. Fruits Dried fruits. Canned fruit in light or heavy syrup. Fruit juice. Meat and Other Protein Products Fatty cuts of meat. Ribs, chicken wings, bacon, sausage, bologna, salami, chitterlings, fatback, hot dogs, bratwurst, and packaged luncheon meats. Salted nuts and seeds. Canned beans with salt. Dairy Whole or 2% milk, cream, half-and-half, and cream cheese. Whole-fat or sweetened yogurt. Full-fat   cheeses or blue cheese. Nondairy creamers and whipped toppings. Processed cheese, cheese spreads, or cheese  curds. Condiments Onion and garlic salt, seasoned salt, table salt, and sea salt. Canned and packaged gravies. Worcestershire sauce. Tartar sauce. Barbecue sauce. Teriyaki sauce. Soy sauce, including reduced sodium. Steak sauce. Fish sauce. Oyster sauce. Cocktail sauce. Horseradish. Ketchup and mustard. Meat flavorings and tenderizers. Bouillon cubes. Hot sauce. Tabasco sauce. Marinades. Taco seasonings. Relishes. Fats and Oils Butter, stick margarine, lard, shortening, ghee, and bacon fat. Coconut, palm kernel, or palm oils. Regular salad dressings. Other Pickles and olives. Salted popcorn and pretzels. The items listed above may not be a complete list of foods and beverages to avoid. Contact your dietitian for more information. WHERE CAN I FIND MORE INFORMATION? National Heart, Lung, and Blood Institute: www.nhlbi.nih.gov/health/health-topics/topics/dash/ Document Released: 02/22/2011 Document Revised: 07/20/2013 Document Reviewed: 01/07/2013 ExitCare Patient Information 2015 ExitCare, LLC. This information is not intended to replace advice given to you by your health care provider. Make sure you discuss any questions you have with your health care provider. Hypertension Hypertension, commonly called high blood pressure, is when the force of blood pumping through your arteries is too strong. Your arteries are the blood vessels that carry blood from your heart throughout your body. A blood pressure reading consists of a higher number over a lower number, such as 110/72. The higher number (systolic) is the pressure inside your arteries when your heart pumps. The lower number (diastolic) is the pressure inside your arteries when your heart relaxes. Ideally you want your blood pressure below 120/80. Hypertension forces your heart to work harder to pump blood. Your arteries may become narrow or stiff. Having hypertension puts you at risk for heart disease, stroke, and other problems.  RISK  FACTORS Some risk factors for high blood pressure are controllable. Others are not.  Risk factors you cannot control include:   Race. You may be at higher risk if you are African American.  Age. Risk increases with age.  Gender. Men are at higher risk than women before age 45 years. After age 65, women are at higher risk than men. Risk factors you can control include:  Not getting enough exercise or physical activity.  Being overweight.  Getting too much fat, sugar, calories, or salt in your diet.  Drinking too much alcohol. SIGNS AND SYMPTOMS Hypertension does not usually cause signs or symptoms. Extremely high blood pressure (hypertensive crisis) may cause headache, anxiety, shortness of breath, and nosebleed. DIAGNOSIS  To check if you have hypertension, your health care provider will measure your blood pressure while you are seated, with your arm held at the level of your heart. It should be measured at least twice using the same arm. Certain conditions can cause a difference in blood pressure between your right and left arms. A blood pressure reading that is higher than normal on one occasion does not mean that you need treatment. If one blood pressure reading is high, ask your health care provider about having it checked again. TREATMENT  Treating high blood pressure includes making lifestyle changes and possibly taking medicine. Living a healthy lifestyle can help lower high blood pressure. You may need to change some of your habits. Lifestyle changes may include:  Following the DASH diet. This diet is high in fruits, vegetables, and whole grains. It is low in salt, red meat, and added sugars.  Getting at least 2 hours of brisk physical activity every week.  Losing weight if necessary.  Not smoking.  Limiting   alcoholic beverages.  Learning ways to reduce stress. If lifestyle changes are not enough to get your blood pressure under control, your health care provider may  prescribe medicine. You may need to take more than one. Work closely with your health care provider to understand the risks and benefits. HOME CARE INSTRUCTIONS  Have your blood pressure rechecked as directed by your health care provider.   Take medicines only as directed by your health care provider. Follow the directions carefully. Blood pressure medicines must be taken as prescribed. The medicine does not work as well when you skip doses. Skipping doses also puts you at risk for problems.   Do not smoke.   Monitor your blood pressure at home as directed by your health care provider. SEEK MEDICAL CARE IF:   You think you are having a reaction to medicines taken.  You have recurrent headaches or feel dizzy.  You have swelling in your ankles.  You have trouble with your vision. SEEK IMMEDIATE MEDICAL CARE IF:  You develop a severe headache or confusion.  You have unusual weakness, numbness, or feel faint.  You have severe chest or abdominal pain.  You vomit repeatedly.  You have trouble breathing. MAKE SURE YOU:   Understand these instructions.  Will watch your condition.  Will get help right away if you are not doing well or get worse. Document Released: 03/05/2005 Document Revised: 07/20/2013 Document Reviewed: 12/26/2012 ExitCare Patient Information 2015 ExitCare, LLC. This information is not intended to replace advice given to you by your health care provider. Make sure you discuss any questions you have with your health care provider.  

## 2014-02-23 NOTE — Progress Notes (Signed)
Pt is here following up on his HTN and chronic pain in his mid back. Pt is requesting to refill his medication. Pt has no C.C. Today. Pt has been out of his BP medications for 5 days and he has been taking his brothers BP medication.

## 2014-02-23 NOTE — Progress Notes (Signed)
Patient ID: Corey Hale, male   DOB: October 16, 1965, 48 y.o.   MRN: [id number]   Corey Hale, is a 48 y.o. male  ION:[id number]  KGM:[id number]  DOB - 03/20/1965  Chief Complaint  Patient presents with  . Follow-up        Subjective:   Corey Hale is a 48 y.o. male here today for a follow up visit. Patient is here today following up on his hypertension and chronic pain in his mid back. He is also requesting medication refills. He has no complaint today. Patient claims he has been out of his blood pressure medications for the past 5 days otherwise is compliant with medications, reports no side effects. Denies any dizziness. Patient has No headache, No chest pain, No abdominal pain - No Nausea, No new weakness tingling or numbness, No Cough - SOB.  Problem  Essential Hypertension    ALLERGIES: No Known Allergies  PAST MEDICAL HISTORY: Past Medical History  Diagnosis Date  . Hypertension   . Hypercholesteremia   . Thyroid disease     MEDICATIONS AT HOME: Prior to Admission medications   Medication Sig Start Date End Date Taking? Authorizing Provider  atenolol-chlorthalidone (TENORETIC) 50-25 MG per tablet Take 1 tablet by mouth daily. 02/23/14  Yes Tresa Garter, MD  pravastatin (PRAVACHOL) 10 MG tablet Take 1 tablet (10 mg total) by mouth daily. 02/23/14  Yes Tresa Garter, MD  valACYclovir (VALTREX) 1000 MG tablet Take 1 tablet (1,000 mg total) by mouth daily. 02/23/14  Yes Nathalya Wolanski Essie Christine, MD  oxyCODONE (ROXICODONE) 15 MG immediate release tablet Take 1 tablet (15 mg total) by mouth every 6 (six) hours as needed for pain. Patient not taking: Reported on 02/23/2014 11/24/13   Leandrew Koyanagi, MD  oxyCODONE-acetaminophen (PERCOCET) 10-325 MG per tablet Take 1 tablet by mouth every 6 (six) hours as needed. For pain    Historical Provider, MD  sildenafil (VIAGRA) 50 MG tablet Take 1 tablet (50 mg total) by mouth daily as needed for erectile dysfunction. Patient not  taking: Reported on 02/23/2014 06/10/12   Clanford Marisa Hua, MD     Objective:   Filed Vitals:   02/23/14 0918  BP: 131/87  Pulse: 60  Temp: 97.9 F (36.6 C)  TempSrc: Oral  Resp: 16  Height: 6' (1.829 m)  Weight: 205 lb (92.987 kg)  SpO2: 99%    Exam General appearance : Awake, alert, not in any distress. Speech Clear. Not toxic looking HEENT: Atraumatic and Normocephalic, pupils equally reactive to light and accomodation Neck: supple, no JVD. No cervical lymphadenopathy.  Chest:Good air entry bilaterally, no added sounds  CVS: S1 S2 regular, no murmurs.  Abdomen: Bowel sounds present, Non tender and not distended with no gaurding, rigidity or rebound. Extremities: B/L Lower Ext shows no edema, both legs are warm to touch Neurology: Awake alert, and oriented X 3, CN II-XII intact, Non focal Skin:No Rash Wounds:N/A  Data Review Lab Results  Component Value Date   HGBA1C 5.7* 06/10/2012     Assessment & Plan   1. Essential hypertension  - CBC with Differential - COMPLETE METABOLIC PANEL WITH GFR - TSH - Urinalysis, Complete - atenolol-chlorthalidone (TENORETIC) 50-25 MG per tablet; Take 1 tablet by mouth daily.  Dispense: 90 tablet; Refill: 3  We have discussed target BP range and blood pressure goal. I have advised patient to check BP regularly and to call us back or report to clinic if the numbers are consistently higher than 140/90. We  discussed the importance of compliance with medical therapy and DASH diet recommended, consequences of uncontrolled hypertension discussed.  - continue current BP medications  2. Dyslipidemia  - Lipid panel - pravastatin (PRAVACHOL) 10 MG tablet; Take 1 tablet (10 mg total) by mouth daily.  Dispense: 90 tablet; Refill: 3  To address this please limit saturated fat to no more than 7% of your calories, limit cholesterol to 200 mg/day, increase fiber and exercise as tolerated. If needed we may add another cholesterol lowering  medication to your regimen.   ROS Review of Systems Physical Exam 3. Herpes exposure  - valACYclovir (VALTREX) 1000 MG tablet; Take 1 tablet (1,000 mg total) by mouth daily.  Dispense: 30 tablet; Refill: 12  Return for Follow up HTN, Follow up Pain and comorbidities.  The patient was given clear instructions to go to ER or return to medical center if symptoms don't improve, worsen or new problems develop. The patient verbalized understanding. The patient was told to call to get lab results if they haven't heard anything in the next week.   This note has been created with Surveyor, quantity. Any transcriptional errors are unintentional.    Angelica Chessman, MD, [place], [place], [place] and [place], [place]   02/23/2014, 9:48 AM

## 2014-02-24 LAB — URINALYSIS, COMPLETE
Bacteria, UA: NONE SEEN
Bilirubin Urine: NEGATIVE
CASTS: NONE SEEN
CRYSTALS: NONE SEEN
GLUCOSE, UA: NEGATIVE mg/dL
Hgb urine dipstick: NEGATIVE
Ketones, ur: NEGATIVE mg/dL
LEUKOCYTES UA: NEGATIVE
Nitrite: NEGATIVE
PH: 6.5 (ref 5.0–8.0)
Protein, ur: NEGATIVE mg/dL
SPECIFIC GRAVITY, URINE: 1.019 (ref 1.005–1.030)
SQUAMOUS EPITHELIAL / LPF: NONE SEEN
Urobilinogen, UA: 0.2 mg/dL (ref 0.0–1.0)

## 2014-03-09 ENCOUNTER — Telehealth: Payer: Self-pay | Admitting: Emergency Medicine

## 2014-03-09 NOTE — Telephone Encounter (Signed)
-----   Message from Tresa Garter, MD sent at 02/26/2014  5:10 PM EST ----- Please inform patient that his laboratory test results are mostly within normal limits. His potassium level is back to normal.

## 2014-03-09 NOTE — Telephone Encounter (Signed)
Left message with normal lab results.

## 2014-03-15 ENCOUNTER — Telehealth: Payer: Self-pay | Admitting: Internal Medicine

## 2014-03-15 NOTE — Telephone Encounter (Signed)
Patient has come in today to request a medication refill for traMADol (ULTRAM) 50 MG tablet; please f/u with patient about this request

## 2014-03-31 ENCOUNTER — Telehealth: Payer: Self-pay | Admitting: Internal Medicine

## 2014-03-31 NOTE — Telephone Encounter (Signed)
Patient came into facility to request medication refill for Tramadol, please f/u with pt.

## 2014-04-05 ENCOUNTER — Telehealth: Payer: Self-pay | Admitting: *Deleted

## 2014-04-05 NOTE — Telephone Encounter (Signed)
Pt requesting refill Rx Tramadol

## 2014-04-06 ENCOUNTER — Encounter (HOSPITAL_COMMUNITY): Payer: Self-pay | Admitting: Neurology

## 2014-04-06 ENCOUNTER — Emergency Department (HOSPITAL_COMMUNITY)
Admission: EM | Admit: 2014-04-06 | Discharge: 2014-04-06 | Disposition: A | Discharge status: Home / Self Care | Payer: Self-pay | Attending: Emergency Medicine | Admitting: Emergency Medicine

## 2014-04-06 DIAGNOSIS — Z7982 Long term (current) use of aspirin: Secondary | ICD-10-CM | POA: Insufficient documentation

## 2014-04-06 DIAGNOSIS — Z720 Tobacco use: Secondary | ICD-10-CM | POA: Insufficient documentation

## 2014-04-06 DIAGNOSIS — G44209 Tension-type headache, unspecified, not intractable: Secondary | ICD-10-CM | POA: Insufficient documentation

## 2014-04-06 DIAGNOSIS — E780 Pure hypercholesterolemia: Secondary | ICD-10-CM | POA: Insufficient documentation

## 2014-04-06 DIAGNOSIS — Z79899 Other long term (current) drug therapy: Secondary | ICD-10-CM | POA: Insufficient documentation

## 2014-04-06 DIAGNOSIS — H9319 Tinnitus, unspecified ear: Secondary | ICD-10-CM | POA: Insufficient documentation

## 2014-04-06 DIAGNOSIS — D4989 Neoplasm of unspecified behavior of other specified sites: Secondary | ICD-10-CM

## 2014-04-06 DIAGNOSIS — D165 Benign neoplasm of lower jaw bone: Secondary | ICD-10-CM | POA: Insufficient documentation

## 2014-04-06 DIAGNOSIS — I10 Essential (primary) hypertension: Secondary | ICD-10-CM | POA: Insufficient documentation

## 2014-04-06 MED ORDER — ORPHENADRINE CITRATE ER 100 MG PO TB12: 100.0000 mg | ORAL_TABLET | Freq: Two times a day (BID) | ORAL | Status: DC

## 2014-04-06 MED ORDER — KETOROLAC TROMETHAMINE 60 MG/2ML IM SOLN
60.0000 mg | Freq: Once | INTRAMUSCULAR | Status: AC
  Administered 2014-04-06: 60 mg via INTRAMUSCULAR
  Filled 2014-04-06: qty 2

## 2014-04-06 MED ORDER — NAPROXEN 500 MG PO TABS: 500.0000 mg | ORAL_TABLET | Freq: Two times a day (BID) | ORAL | Status: DC

## 2014-04-06 MED ORDER — METHOCARBAMOL 500 MG PO TABS
1000.0000 mg | ORAL_TABLET | Freq: Once | ORAL | Status: AC
  Administered 2014-04-06: 1000 mg via ORAL
  Filled 2014-04-06: qty 2

## 2014-04-06 NOTE — Discharge Instructions (Signed)

## 2014-04-06 NOTE — ED Notes (Signed)
Pt reports intermittent pain from top head to top of neck for 3 years. Also cyst below right ear for 2 years. Also ringing in ears for 2 years.

## 2014-04-06 NOTE — ED Provider Notes (Signed)
CSN: [id number]     Arrival date & time 04/06/14  1720 History   First MD Initiated Contact with Patient 04/06/14 1753     Chief Complaint  Patient presents with  . Headache  . Cyst     (Consider location/radiation/quality/duration/timing/severity/associated sxs/prior Treatment) HPI The patient ports he has a posterior headache that has come and gone for over 3 years duration. The patient indicates aligned is a symmetric region that goes right across the upper portion of his occiput. He reports it is exacerbated by holding his head in a flexed position. The patient reports that the pain will last for days sometimes. He does not get associated symptoms. It is a constant aching quality. There is no associated nausea or vomiting. No associated sinus symptoms. The patient does endorse tinnitus which he reports for 2 years duration. He reports he's taken doses of Advil without relief. The patient also mentions desire for treatment of a mass on the side of his face is been present for 2 years. There has been no acute change. Past Medical History  Diagnosis Date  . Hypertension   . Hypercholesteremia   . Thyroid disease    Past Surgical History  Procedure Laterality Date  . Neck surgery    . Arm hardware removal Right    Family History  Problem Relation Age of Onset  . Stroke Mother   . Stroke Father    History  Substance Use Topics  . Smoking status: Light Tobacco Smoker  . Smokeless tobacco: Never Used  . Alcohol Use: No    Review of Systems  10 Systems reviewed and are negative for acute change except as noted in the HPI.   Allergies  Review of patient's allergies indicates no known allergies.  Home Medications   Prior to Admission medications   Medication Sig Start Date End Date Taking? Authorizing Provider  aspirin 81 MG tablet Take 81 mg by mouth daily.   Yes Historical Provider, MD  atenolol-chlorthalidone (TENORETIC) 50-25 MG per tablet Take 1 tablet by mouth daily.  02/23/14  Yes Tresa Garter, MD  pravastatin (PRAVACHOL) 20 MG tablet Take 10 mg by mouth daily.   Yes Historical Provider, MD  naproxen (NAPROSYN) 500 MG tablet Take 1 tablet (500 mg total) by mouth 2 (two) times daily. 04/06/14   Charlesetta Shanks, MD  orphenadrine (NORFLEX) 100 MG tablet Take 1 tablet (100 mg total) by mouth 2 (two) times daily. 04/06/14   Charlesetta Shanks, MD  oxyCODONE (ROXICODONE) 15 MG immediate release tablet Take 1 tablet (15 mg total) by mouth every 6 (six) hours as needed for pain. Patient not taking: Reported on 02/23/2014 11/24/13   Leandrew Koyanagi, MD  pravastatin (PRAVACHOL) 10 MG tablet Take 1 tablet (10 mg total) by mouth daily. Patient not taking: Reported on 04/06/2014 02/23/14   Tresa Garter, MD  sildenafil (VIAGRA) 50 MG tablet Take 1 tablet (50 mg total) by mouth daily as needed for erectile dysfunction. Patient not taking: Reported on 02/23/2014 06/10/12   Clanford Marisa Hua, MD  traMADol (ULTRAM) 50 MG tablet Take 1 tablet (50 mg total) by mouth every 8 (eight) hours as needed. Patient not taking: Reported on 04/06/2014 02/23/14   Tresa Garter, MD  valACYclovir (VALTREX) 1000 MG tablet Take 1 tablet (1,000 mg total) by mouth daily. Patient not taking: Reported on 04/06/2014 02/23/14   Tresa Garter, MD   BP 129/83 mmHg  Pulse 76  Temp(Src) 97.3 F (36.3 C)  Resp  18  SpO2 98% Physical Exam  Constitutional: He is oriented to person, place, and time. He appears well-developed and well-nourished.  The patient has well appearance he is sitting up in a chair.  HENT:  Head: Normocephalic and atraumatic.  The patient has a smooth cystic feeling mass at the angle of the right mandible. This is mobile and approximately 2 cm. There is no associated induration or skin changes. The patient's right TM is normal. There is moderate cerumen impaction on the left. The visualized portion of the drum does not appear erythematous. The patient has excellent  dentition with some dental work present. The posterior oropharynx is widely patent with symmetric uvula and no erythema or exudate present.  Eyes: EOM are normal. Pupils are equal, round, and reactive to light. Right eye exhibits no discharge. Left eye exhibits no discharge. No scleral icterus.  Neck: Neck supple. No thyromegaly present.  Cardiovascular: Normal rate, regular rhythm, normal heart sounds and intact distal pulses.   Pulmonary/Chest: Effort normal and breath sounds normal.  Abdominal: Soft. Bowel sounds are normal. He exhibits no distension. There is no tenderness.  Musculoskeletal: Normal range of motion. He exhibits no edema.  Lymphadenopathy:    He has no cervical adenopathy.  Neurological: He is alert and oriented to person, place, and time. He has normal strength. No cranial nerve deficit. He exhibits normal muscle tone. Coordination normal. GCS eye subscore is 4. GCS verbal subscore is 5. GCS motor subscore is 6.  Skin: Skin is warm, dry and intact.  Psychiatric: He has a normal mood and affect.    ED Course  Procedures (including critical care time) Labs Review Labs Reviewed - No data to display  Imaging Review No results found.   EKG Interpretation None      MDM   Final diagnoses:  Tension headache  Tumor of jaw   The patient is well in appearance today the identified problems have been existing for several years duration. At this point on physical examination or history I do not identify any neurologic abnormality or change in quality that was suggestive diagnostic workup for today. The patient's pattern of pain or region is suggestive of tension headache. 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 This is smooth and mobile. The patient is counseled that it will have to be evaluated and definitively treated most likely by ENT. There is no acute change of erythema or induration to suggest a treatment at  this time.    Charlesetta Shanks, MD 04/06/14 [id number]

## 2014-04-06 NOTE — ED Notes (Signed)
NAD at this time.  

## 2014-04-13 ENCOUNTER — Telehealth: Payer: Self-pay | Admitting: Internal Medicine

## 2014-04-13 ENCOUNTER — Telehealth: Payer: Self-pay | Admitting: *Deleted

## 2014-04-13 ENCOUNTER — Ambulatory Visit (INDEPENDENT_AMBULATORY_CARE_PROVIDER_SITE_OTHER): Payer: Self-pay | Admitting: Family Medicine

## 2014-04-13 VITALS — BP 134/92 | HR 89 | Temp 98.6°F | Resp 18 | Ht 72.0 in | Wt 210.0 lb

## 2014-04-13 DIAGNOSIS — M546 Pain in thoracic spine: Secondary | ICD-10-CM

## 2014-04-13 MED ORDER — ORPHENADRINE CITRATE ER 100 MG PO TB12: 100.0000 mg | ORAL_TABLET | Freq: Two times a day (BID) | ORAL | Status: DC

## 2014-04-13 MED ORDER — OXYCODONE HCL 15 MG PO TABS: 15.0000 mg | ORAL_TABLET | Freq: Four times a day (QID) | ORAL | Status: DC | PRN

## 2014-04-13 NOTE — Telephone Encounter (Signed)
Pt requesting refill on Tramadol, please f/u with pt.
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
## 2014-04-14 ENCOUNTER — Telehealth: Payer: Self-pay | Admitting: *Deleted

## 2014-04-14 ENCOUNTER — Ambulatory Visit: Payer: Self-pay | Attending: Internal Medicine

## 2014-04-14 ENCOUNTER — Telehealth: Payer: Self-pay | Admitting: Internal Medicine

## 2014-04-14 NOTE — Telephone Encounter (Signed)
LVM to return call.

## 2014-04-14 NOTE — Telephone Encounter (Signed)
Patient has come in today to request a referral to Otolaryngology; please f/u with patient about his request;

## 2014-04-22 ENCOUNTER — Telehealth: Payer: Self-pay | Admitting: Emergency Medicine

## 2014-04-22 ENCOUNTER — Telehealth: Payer: Self-pay | Admitting: Internal Medicine

## 2014-04-22 DIAGNOSIS — D179 Benign lipomatous neoplasm, unspecified: Secondary | ICD-10-CM

## 2014-04-22 NOTE — Telephone Encounter (Signed)
Pt called in for General surgery referral for facial Lipoma General Surgery referral placed Pt made aware

## 2014-04-22 NOTE — Telephone Encounter (Signed)
Patient is returning call from nurse about his referral, Please f/u with pt.

## 2014-04-29 ENCOUNTER — Emergency Department (HOSPITAL_COMMUNITY)
Admission: EM | Admit: 2014-04-29 | Discharge: 2014-04-29 | Disposition: A | Discharge status: Home / Self Care | Payer: Self-pay | Attending: Emergency Medicine | Admitting: Emergency Medicine

## 2014-04-29 ENCOUNTER — Encounter (HOSPITAL_COMMUNITY): Payer: Self-pay | Admitting: Adult Health

## 2014-04-29 DIAGNOSIS — R519 Headache, unspecified: Secondary | ICD-10-CM

## 2014-04-29 DIAGNOSIS — Z79899 Other long term (current) drug therapy: Secondary | ICD-10-CM | POA: Insufficient documentation

## 2014-04-29 DIAGNOSIS — I10 Essential (primary) hypertension: Secondary | ICD-10-CM | POA: Insufficient documentation

## 2014-04-29 DIAGNOSIS — R51 Headache: Secondary | ICD-10-CM | POA: Insufficient documentation

## 2014-04-29 DIAGNOSIS — Z7982 Long term (current) use of aspirin: Secondary | ICD-10-CM | POA: Insufficient documentation

## 2014-04-29 DIAGNOSIS — E780 Pure hypercholesterolemia: Secondary | ICD-10-CM | POA: Insufficient documentation

## 2014-04-29 DIAGNOSIS — Z720 Tobacco use: Secondary | ICD-10-CM | POA: Insufficient documentation

## 2014-04-29 MED ORDER — TRAMADOL HCL 50 MG PO TABS: 50.0000 mg | ORAL_TABLET | Freq: Four times a day (QID) | ORAL | Status: DC | PRN

## 2014-04-29 NOTE — ED Notes (Addendum)
Presents with pain in the back of head began 2 weeks ago and he has been seen for same. Pain does not come and go, but gradually worsens and gradually gets better. Denies liight and sound sensitivty. Denies dizziness. He states, the pain is just very strong pain.  Alert, oriented, MAE x4

## 2014-04-29 NOTE — ED Provider Notes (Signed)
CSN: [id number]     Arrival date & time 04/29/14  1808 History   First MD Initiated Contact with Patient 04/29/14 2023     Chief Complaint  Patient presents with  . Headache     (Consider location/radiation/quality/duration/timing/severity/associated sxs/prior Treatment) HPI Comments: PAtient with chronic headaches that has been taking Advil without relief  Has tried cold compress, position.  Has tried Ultram in the past with some relief.  Has not seen any specialist but does seen PCP.  Patient is frustrate that we can not give him and answer for his pain   Patient is a 49 y.o. male presenting with headaches. The history is provided by the patient.  Headache Radiates to:  Does not radiate Severity currently:  5/10 Severity at highest:  5/10 Onset quality:  Gradual Timing:  Intermittent Progression:  Unchanged Chronicity:  Chronic Similar to prior headaches: yes   Relieved by:  Nothing Worsened by:  Nothing Ineffective treatments:  Cold packs, NSAIDs and acetaminophen Associated symptoms: no blurred vision, no dizziness, no ear pain, no facial pain, no fever, no numbness and no weakness     Past Medical History  Diagnosis Date  . Hypertension   . Hypercholesteremia   . Thyroid disease    Past Surgical History  Procedure Laterality Date  . Neck surgery    . Arm hardware removal Right    Family History  Problem Relation Age of Onset  . Stroke Mother   . Stroke Father    History  Substance Use Topics  . Smoking status: Light Tobacco Smoker  . Smokeless tobacco: Never Used  . Alcohol Use: No    Review of Systems  Constitutional: Negative for fever.  HENT: Negative for ear pain.   Eyes: Negative for blurred vision and visual disturbance.  Neurological: Positive for headaches. Negative for dizziness, weakness and numbness.  All other systems reviewed and are negative.     Allergies  Review of patient's allergies indicates no known allergies.  Home Medications    Prior to Admission medications   Medication Sig Start Date End Date Taking? Authorizing Provider  aspirin 81 MG tablet Take 81 mg by mouth daily.   Yes Historical Provider, MD  atenolol-chlorthalidone (TENORETIC) 50-25 MG per tablet Take 1 tablet by mouth daily. 02/23/14  Yes Tresa Garter, MD  naproxen (NAPROSYN) 500 MG tablet Take 1 tablet (500 mg total) by mouth 2 (two) times daily. 04/06/14  Yes Charlesetta Shanks, MD  orphenadrine (NORFLEX) 100 MG tablet Take 1 tablet (100 mg total) by mouth 2 (two) times daily. 04/13/14  Yes Robyn Haber, MD  oxyCODONE (ROXICODONE) 15 MG immediate release tablet Take 1 tablet (15 mg total) by mouth every 6 (six) hours as needed for pain. 04/13/14  Yes Robyn Haber, MD  pravastatin (PRAVACHOL) 10 MG tablet Take 1 tablet (10 mg total) by mouth daily. 02/23/14  Yes Tresa Garter, MD  pravastatin (PRAVACHOL) 20 MG tablet Take 10 mg by mouth daily.   Yes Historical Provider, MD  sildenafil (VIAGRA) 50 MG tablet Take 1 tablet (50 mg total) by mouth daily as needed for erectile dysfunction. Patient not taking: Reported on 02/23/2014 06/10/12   Clanford Marisa Hua, MD  traMADol (ULTRAM) 50 MG tablet Take 1 tablet (50 mg total) by mouth every 6 (six) hours as needed for moderate pain. 04/29/14   Garald Balding, NP   BP 118/78 mmHg  Pulse 63  Temp(Src) 97.7 F (36.5 C) (Oral)  Resp 18  Ht 6' (  1.829 m)  Wt 207 lb (93.895 kg)  BMI 28.07 kg/m2  SpO2 99% Physical Exam  Constitutional: He is oriented to person, place, and time. He appears well-developed and well-nourished.  HENT:  Head: Normocephalic.  Right Ear: External ear normal.  Left Ear: External ear normal.  Mouth/Throat: Oropharynx is clear and moist.  Eyes: Pupils are equal, round, and reactive to light.  Neck: Normal range of motion. Muscular tenderness present. No spinous process tenderness present.  Cardiovascular: Normal rate.   Pulmonary/Chest: Effort normal.  Abdominal: Soft.   Musculoskeletal: Normal range of motion.  Lymphadenopathy:    He has no cervical adenopathy.  Neurological: He is alert and oriented to person, place, and time.  Skin: Skin is warm. No erythema.  Psychiatric: His behavior is normal.  Nursing note and vitals reviewed.   ED Course  Procedures (including critical care time) Labs Review Labs Reviewed - No data to display  Imaging Review No results found.   EKG Interpretation None     Discussed at length referral to headache specialist  Patient in agreement will Rx Ultram adn give referral  MDM   Final diagnoses:  Chronic nonintractable headache, unspecified headache type         Garald Balding, NP 04/29/14 8413  Threasa Beards, MD 04/29/14 2059

## 2014-04-29 NOTE — ED Notes (Signed)
Patient is alert and orientedx4.  Patient was explained discharge instructions and they understood them with no questions.   

## 2014-04-29 NOTE — Discharge Instructions (Signed)
Please call the [facility] for an appointment

## 2014-05-20 ENCOUNTER — Ambulatory Visit (INDEPENDENT_AMBULATORY_CARE_PROVIDER_SITE_OTHER): Payer: Self-pay | Admitting: Family Medicine

## 2014-05-20 VITALS — BP 130/86 | HR 75 | Temp 98.0°F | Resp 24 | Ht 70.5 in | Wt 206.0 lb

## 2014-05-20 DIAGNOSIS — M546 Pain in thoracic spine: Secondary | ICD-10-CM

## 2014-05-20 DIAGNOSIS — Z8781 Personal history of (healed) traumatic fracture: Secondary | ICD-10-CM | POA: Insufficient documentation

## 2014-05-20 MED ORDER — OXYCODONE HCL 15 MG PO TABS: 15.0000 mg | ORAL_TABLET | ORAL | Status: DC | PRN

## 2014-05-20 NOTE — Patient Instructions (Signed)
Take oxycodone as needed for pain. Consider going to see a new orthopedist when you return from your trip.
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
## 2014-05-22 NOTE — Progress Notes (Signed)
History and physical examinations reviewed in detail with Bennett Scrape, PA-C.  Agree with assessment and plan.

## 2014-06-20 ENCOUNTER — Emergency Department (HOSPITAL_BASED_OUTPATIENT_CLINIC_OR_DEPARTMENT_OTHER)
Admission: EM | Admit: 2014-06-20 | Discharge: 2014-06-20 | Disposition: A | Discharge status: Home / Self Care | Payer: Self-pay | Attending: Emergency Medicine | Admitting: Emergency Medicine

## 2014-06-20 ENCOUNTER — Encounter (HOSPITAL_BASED_OUTPATIENT_CLINIC_OR_DEPARTMENT_OTHER): Payer: Self-pay

## 2014-06-20 DIAGNOSIS — Z87891 Personal history of nicotine dependence: Secondary | ICD-10-CM | POA: Insufficient documentation

## 2014-06-20 DIAGNOSIS — Z79899 Other long term (current) drug therapy: Secondary | ICD-10-CM | POA: Insufficient documentation

## 2014-06-20 DIAGNOSIS — Z7982 Long term (current) use of aspirin: Secondary | ICD-10-CM | POA: Insufficient documentation

## 2014-06-20 DIAGNOSIS — M546 Pain in thoracic spine: Secondary | ICD-10-CM | POA: Insufficient documentation

## 2014-06-20 DIAGNOSIS — E780 Pure hypercholesterolemia: Secondary | ICD-10-CM | POA: Insufficient documentation

## 2014-06-20 DIAGNOSIS — Z791 Long term (current) use of non-steroidal anti-inflammatories (NSAID): Secondary | ICD-10-CM | POA: Insufficient documentation

## 2014-06-20 DIAGNOSIS — I10 Essential (primary) hypertension: Secondary | ICD-10-CM | POA: Insufficient documentation

## 2014-06-20 DIAGNOSIS — M549 Dorsalgia, unspecified: Secondary | ICD-10-CM

## 2014-06-20 DIAGNOSIS — G8929 Other chronic pain: Secondary | ICD-10-CM | POA: Insufficient documentation

## 2014-06-20 HISTORY — DX: Other chronic pain: G89.29

## 2014-06-20 HISTORY — DX: Dorsalgia, unspecified: M54.9

## 2014-06-20 LAB — URINALYSIS, ROUTINE W REFLEX MICROSCOPIC
BILIRUBIN URINE: NEGATIVE
GLUCOSE, UA: NEGATIVE mg/dL
Hgb urine dipstick: NEGATIVE
Ketones, ur: 15 mg/dL — AB
Leukocytes, UA: NEGATIVE
Nitrite: NEGATIVE
Protein, ur: NEGATIVE mg/dL
Specific Gravity, Urine: 1.024 (ref 1.005–1.030)
Urobilinogen, UA: 0.2 mg/dL (ref 0.0–1.0)
pH: 5.5 (ref 5.0–8.0)

## 2014-06-20 MED ORDER — IBUPROFEN 800 MG PO TABS
800.0000 mg | ORAL_TABLET | Freq: Once | ORAL | Status: AC
  Administered 2014-06-20: 800 mg via ORAL
  Filled 2014-06-20: qty 1

## 2014-06-20 MED ORDER — IBUPROFEN 800 MG PO TABS
800.0000 mg | ORAL_TABLET | Freq: Three times a day (TID) | ORAL | Status: DC
Start: 2014-06-20 — End: 2014-08-30

## 2014-06-20 NOTE — Discharge Instructions (Signed)
Chronic Back Pain Call [facility] to arrange to be seen if you continue to have significant back pain in 2 or 3 days.take the medication prescribed as needed for pain  When back pain lasts longer than 3 months, it is called chronic back pain.People with chronic back pain often go through certain periods that are more intense (flare-ups).  CAUSES Chronic back pain can be caused by wear and tear (degeneration) on different structures in your back. These structures include:  The bones of your spine (vertebrae) and the joints surrounding your spinal cord and nerve roots (facets).  The strong, fibrous tissues that connect your vertebrae (ligaments). Degeneration of these structures may result in pressure on your nerves. This can lead to constant pain. HOME CARE INSTRUCTIONS  Avoid bending, heavy lifting, prolonged sitting, and activities which make the problem worse.  Take brief periods of rest throughout the day to reduce your pain. Lying down or standing usually is better than sitting while you are resting.  Take over-the-counter or prescription medicines only as directed by your caregiver. SEEK IMMEDIATE MEDICAL CARE IF:   You have weakness or numbness in one of your legs or feet.  You have trouble controlling your bladder or bowels.  You have nausea, vomiting, abdominal pain, shortness of breath, or fainting. Document Released: 04/12/2004 Document Revised: 05/28/2011 Document Reviewed: 02/17/2011 [facility] Patient Information 2015 [place], [place]. This information is not intended to replace advice given to you by your health care provider. Make sure you discuss any questions you have with your health care provider.

## 2014-06-20 NOTE — ED Notes (Signed)
Pt dressed, sitting in chair, ready to go.

## 2014-06-20 NOTE — ED Notes (Addendum)
Pt alert, NAD, calm, interactive, c/o mid upper back pain. (Denies: neck, arm or hand pain, sob, nv, dizziness or sx other than pain). Took aleve yesterday. Ran out of oxycodone yesterday. H/o similar.

## 2014-06-20 NOTE — ED Notes (Signed)
Pt with chronic back pain mid thoracic area.  No injury, reports cut his grass with push mower yesterday and worsening pain since this time.  No radiation to legs or n/t, full control of bowel/bladder.  Ambulatory without difficulty.
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
## 2014-07-06 ENCOUNTER — Ambulatory Visit: Payer: Self-pay | Attending: Internal Medicine | Admitting: Internal Medicine

## 2014-07-06 ENCOUNTER — Encounter: Payer: Self-pay | Admitting: Internal Medicine

## 2014-07-06 VITALS — BP 131/91 | HR 62 | Temp 98.4°F | Resp 18 | Ht 72.0 in | Wt 215.0 lb

## 2014-07-06 DIAGNOSIS — M545 Low back pain, unspecified: Secondary | ICD-10-CM | POA: Insufficient documentation

## 2014-07-06 MED ORDER — TRAMADOL HCL 50 MG PO TABS: 50.0000 mg | ORAL_TABLET | Freq: Three times a day (TID) | ORAL | Status: DC | PRN

## 2014-07-06 NOTE — Progress Notes (Signed)
Pt comes in for medication refill Tramadol for constant,achy lower back pain  Declined medication refills on maintenance meds

## 2014-07-06 NOTE — Patient Instructions (Signed)
DASH Eating Plan DASH stands for "Dietary Approaches to Stop Hypertension." The DASH eating plan is a healthy eating plan that has been shown to reduce high blood pressure (hypertension). Additional health benefits may include reducing the risk of type 2 diabetes mellitus, heart disease, and stroke. The DASH eating plan may also help with weight loss. WHAT DO I NEED TO KNOW ABOUT THE DASH EATING PLAN? For the DASH eating plan, you will follow these general guidelines:  Choose foods with a percent daily value for sodium of less than 5% (as listed on the food label).  Use salt-free seasonings or herbs instead of table salt or sea salt.  Check with your health care provider or pharmacist before using salt substitutes.  Eat lower-sodium products, often labeled as "lower sodium" or "no salt added."  Eat fresh foods.  Eat more vegetables, fruits, and low-fat dairy products.  Choose whole grains. Look for the word "whole" as the first word in the ingredient list.  Choose fish and skinless chicken or turkey more often than red meat. Limit fish, poultry, and meat to 6 oz (170 g) each day.  Limit sweets, desserts, sugars, and sugary drinks.  Choose heart-healthy fats.  Limit cheese to 1 oz (28 g) per day.  Eat more home-cooked food and less restaurant, buffet, and fast food.  Limit fried foods.  Cook foods using methods other than frying.  Limit canned vegetables. If you do use them, rinse them well to decrease the sodium.  When eating at a restaurant, ask that your food be prepared with less salt, or no salt if possible. WHAT FOODS CAN I EAT? Seek help from a dietitian for individual calorie needs. Grains Whole grain or whole wheat bread. Brown rice. Whole grain or whole wheat pasta. Quinoa, bulgur, and whole grain cereals. Low-sodium cereals. Corn or whole wheat flour tortillas. Whole grain cornbread. Whole grain crackers. Low-sodium crackers. Vegetables Fresh or frozen vegetables  (raw, steamed, roasted, or grilled). Low-sodium or reduced-sodium tomato and vegetable juices. Low-sodium or reduced-sodium tomato sauce and paste. Low-sodium or reduced-sodium canned vegetables.  Fruits All fresh, canned (in natural juice), or frozen fruits. Meat and Other Protein Products Ground beef (85% or leaner), grass-fed beef, or beef trimmed of fat. Skinless chicken or turkey. Ground chicken or turkey. Pork trimmed of fat. All fish and seafood. Eggs. Dried beans, peas, or lentils. Unsalted nuts and seeds. Unsalted canned beans. Dairy Low-fat dairy products, such as skim or 1% milk, 2% or reduced-fat cheeses, low-fat ricotta or cottage cheese, or plain low-fat yogurt. Low-sodium or reduced-sodium cheeses. Fats and Oils Tub margarines without trans fats. Light or reduced-fat mayonnaise and salad dressings (reduced sodium). Avocado. Safflower, olive, or canola oils. Natural peanut or almond butter. Other Unsalted popcorn and pretzels. The items listed above may not be a complete list of recommended foods or beverages. Contact your dietitian for more options. WHAT FOODS ARE NOT RECOMMENDED? Grains White bread. White pasta. White rice. Refined cornbread. Bagels and croissants. Crackers that contain trans fat. Vegetables Creamed or fried vegetables. Vegetables in a cheese sauce. Regular canned vegetables. Regular canned tomato sauce and paste. Regular tomato and vegetable juices. Fruits Dried fruits. Canned fruit in light or heavy syrup. Fruit juice. Meat and Other Protein Products Fatty cuts of meat. Ribs, chicken wings, bacon, sausage, bologna, salami, chitterlings, fatback, hot dogs, bratwurst, and packaged luncheon meats. Salted nuts and seeds. Canned beans with salt. Dairy Whole or 2% milk, cream, half-and-half, and cream cheese. Whole-fat or sweetened yogurt. Full-fat   cheeses or blue cheese. Nondairy creamers and whipped toppings. Processed cheese, cheese spreads, or cheese  curds. Condiments Onion and garlic salt, seasoned salt, table salt, and sea salt. Canned and packaged gravies. Worcestershire sauce. Tartar sauce. Barbecue sauce. Teriyaki sauce. Soy sauce, including reduced sodium. Steak sauce. Fish sauce. Oyster sauce. Cocktail sauce. Horseradish. Ketchup and mustard. Meat flavorings and tenderizers. Bouillon cubes. Hot sauce. Tabasco sauce. Marinades. Taco seasonings. Relishes. Fats and Oils Butter, stick margarine, lard, shortening, ghee, and bacon fat. Coconut, palm kernel, or palm oils. Regular salad dressings. Other Pickles and olives. Salted popcorn and pretzels. The items listed above may not be a complete list of foods and beverages to avoid. Contact your dietitian for more information. WHERE CAN I FIND MORE INFORMATION? National Heart, Lung, and Blood Institute: travelstabloid.com Document Released: 02/22/2011 Document Revised: 07/20/2013 Document Reviewed: 01/07/2013 [facility] Patient Information 2015 [place], [place]. This information is not intended to replace advice given to you by your health care provider. Make sure you discuss any questions you have with your health care provider. Hypertension Hypertension, commonly called high blood pressure, is when the force of blood pumping through your arteries is too strong. Your arteries are the blood vessels that carry blood from your heart throughout your body. A blood pressure reading consists of a higher number over a lower number, such as 110/72. The higher number (systolic) is the pressure inside your arteries when your heart pumps. The lower number (diastolic) is the pressure inside your arteries when your heart relaxes. Ideally you want your blood pressure below 120/80. Hypertension forces your heart to work harder to pump blood. Your arteries may become narrow or stiff. Having hypertension puts you at risk for heart disease, stroke, and other problems.  RISK  FACTORS Some risk factors for high blood pressure are controllable. Others are not.  Risk factors you cannot control include:   Race. You may be at higher risk if you are African American.  Age. Risk increases with age.  Gender. Men are at higher risk than women before age 37 years. After age 55, women are at higher risk than men. Risk factors you can control include:  Not getting enough exercise or physical activity.  Being overweight.  Getting too much fat, sugar, calories, or salt in your diet.  Drinking too much alcohol. SIGNS AND SYMPTOMS Hypertension does not usually cause signs or symptoms. Extremely high blood pressure (hypertensive crisis) may cause headache, anxiety, shortness of breath, and nosebleed. DIAGNOSIS  To check if you have hypertension, your health care provider will measure your blood pressure while you are seated, with your arm held at the level of your heart. It should be measured at least twice using the same arm. Certain conditions can cause a difference in blood pressure between your right and left arms. A blood pressure reading that is higher than normal on one occasion does not mean that you need treatment. If one blood pressure reading is high, ask your health care provider about having it checked again. TREATMENT  Treating high blood pressure includes making lifestyle changes and possibly taking medicine. Living a healthy lifestyle can help lower high blood pressure. You may need to change some of your habits. Lifestyle changes may include:  Following the DASH diet. This diet is high in fruits, vegetables, and whole grains. It is low in salt, red meat, and added sugars.  Getting at least 2 hours of brisk physical activity every week.  Losing weight if necessary.  Not smoking.  Limiting  alcoholic beverages.  Learning ways to reduce stress. If lifestyle changes are not enough to get your blood pressure under control, your health care provider may  prescribe medicine. You may need to take more than one. Work closely with your health care provider to understand the risks and benefits. HOME CARE INSTRUCTIONS  Have your blood pressure rechecked as directed by your health care provider.   Take medicines only as directed by your health care provider. Follow the directions carefully. Blood pressure medicines must be taken as prescribed. The medicine does not work as well when you skip doses. Skipping doses also puts you at risk for problems.   Do not smoke.   Monitor your blood pressure at home as directed by your health care provider. SEEK MEDICAL CARE IF:   You think you are having a reaction to medicines taken.  You have recurrent headaches or feel dizzy.  You have swelling in your ankles.  You have trouble with your vision. SEEK IMMEDIATE MEDICAL CARE IF:  You develop a severe headache or confusion.  You have unusual weakness, numbness, or feel faint.  You have severe chest or abdominal pain.  You vomit repeatedly.  You have trouble breathing. MAKE SURE YOU:   Understand these instructions.  Will watch your condition.  Will get help right away if you are not doing well or get worse. Document Released: 03/05/2005 Document Revised: 07/20/2013 Document Reviewed: 12/26/2012 [facility] Patient Information 2015 [place], [place]. This information is not intended to replace advice given to you by your health care provider. Make sure you discuss any questions you have with your health care provider. Back Pain, Adult Low back pain is very common. About 1 in 5 people have back pain.The cause of low back pain is rarely dangerous. The pain often gets better over time.About half of people with a sudden onset of back pain feel better in just 2 weeks. About 8 in 10 people feel better by 6 weeks.  CAUSES Some common causes of back pain include:  Strain of the muscles or ligaments supporting the spine.  Wear and tear (degeneration)  of the spinal discs.  Arthritis.  Direct injury to the back. DIAGNOSIS Most of the time, the direct cause of low back pain is not known.However, back pain can be treated effectively even when the exact cause of the pain is unknown.Answering your caregiver's questions about your overall health and symptoms is one of the most accurate ways to make sure the cause of your pain is not dangerous. If your caregiver needs more information, he or she may order lab work or imaging tests (X-rays or MRIs).However, even if imaging tests show changes in your back, this usually does not require surgery. HOME CARE INSTRUCTIONS For many people, back pain returns.Since low back pain is rarely dangerous, it is often a condition that people can learn to [facility] their own.   Remain active. It is stressful on the back to sit or stand in one place. Do not sit, drive, or stand in one place for more than 30 minutes at a time. Take short walks on level surfaces as soon as pain allows.Try to increase the length of time you walk each day.  Do not stay in bed.Resting more than 1 or 2 days can delay your recovery.  Do not avoid exercise or work.Your body is made to move.It is not dangerous to be active, even though your back may hurt.Your back will likely heal faster if you return to being active  before your pain is gone.  Pay attention to your body when you bend and lift. Many people have less discomfortwhen lifting if they bend their knees, keep the load close to their bodies,and avoid twisting. Often, the most comfortable positions are those that put less stress on your recovering back.  Find a comfortable position to sleep. Use a firm mattress and lie on your side with your knees slightly bent. If you lie on your back, put a pillow under your knees.  Only take over-the-counter or prescription medicines as directed by your caregiver. Over-the-counter medicines to reduce pain and inflammation are often the  most helpful.Your caregiver may prescribe muscle relaxant drugs.These medicines help dull your pain so you can more quickly return to your normal activities and healthy exercise.  Put ice on the injured area.  Put ice in a plastic bag.  Place a towel between your skin and the bag.  Leave the ice on for 15-20 minutes, 03-04 times a day for the first 2 to 3 days. After that, ice and heat may be alternated to reduce pain and spasms.  Ask your caregiver about trying back exercises and gentle massage. This may be of some benefit.  Avoid feeling anxious or stressed.Stress increases muscle tension and can worsen back pain.It is important to recognize when you are anxious or stressed and learn ways to manage it.Exercise is a great option. SEEK MEDICAL CARE IF:  You have pain that is not relieved with rest or medicine.  You have pain that does not improve in 1 week.  You have new symptoms.  You are generally not feeling well. SEEK IMMEDIATE MEDICAL CARE IF:   You have pain that radiates from your back into your legs.  You develop new bowel or bladder control problems.  You have unusual weakness or numbness in your arms or legs.  You develop nausea or vomiting.  You develop abdominal pain.  You feel faint. Document Released: 03/05/2005 Document Revised: 09/04/2011 Document Reviewed: 07/07/2013 [facility] Patient Information 2015 [place], [place]. This information is not intended to replace advice given to you by your health care provider. Make sure you discuss any questions you have with your health care provider.

## 2014-07-06 NOTE — Progress Notes (Signed)
Patient ID: FOCH ROSENWALD, male   DOB: March 19, 1966, 49 y.o.   MRN: [id number]   Corey Hale, is a 49 y.o. male  BWG:[id number]  [id number]:[id number]  DOB - [birth year]  Chief Complaint  Patient presents with  . Follow-up  . Back Pain  . Medication Refill        Subjective:   Corey Hale is a 49 y.o. male here today for a follow up visit.  Patient has history of hypertension, dyslipidemia, right sided parotid swelling chronic and chronic low back pain. He is here today for pain medication refill. Has no new complaints. Patient continues to smoke cigarettes about 1 pack per week , he does not drink alcohol. Patient has No headache, No chest pain, No abdominal pain - No Nausea, No new weakness tingling or numbness, No Cough - SOB.  Problem  Back Pain At L4-L5 Level    ALLERGIES: No Known Allergies  PAST MEDICAL HISTORY: Past Medical History  Diagnosis Date  . Hypertension   . Hypercholesteremia   . Thyroid disease   . Chronic back pain     MEDICATIONS AT HOME: Prior to Admission medications   Medication Sig Start Date End Date Taking? Authorizing Provider  aspirin 81 MG tablet Take 81 mg by mouth daily.   Yes Historical Provider, MD  atenolol-chlorthalidone (TENORETIC) 50-25 MG per tablet Take 1 tablet by mouth daily. 02/23/14  Yes Tresa Garter, MD  pravastatin (PRAVACHOL) 20 MG tablet Take 10 mg by mouth daily.   Yes Historical Provider, MD  ibuprofen (ADVIL,MOTRIN) 800 MG tablet Take 1 tablet (800 mg total) by mouth 3 (three) times daily. Patient not taking: Reported on 07/06/2014 06/20/14   Orlie Dakin, MD  naproxen (NAPROSYN) 500 MG tablet Take 1 tablet (500 mg total) by mouth 2 (two) times daily. Patient not taking: Reported on 07/06/2014 04/06/14   Charlesetta Shanks, MD  orphenadrine (NORFLEX) 100 MG tablet Take 1 tablet (100 mg total) by mouth 2 (two) times daily. Patient not taking: Reported on 07/06/2014 04/13/14   Robyn Haber, MD  oxyCODONE (ROXICODONE) 15 MG immediate  release tablet Take 1 tablet (15 mg total) by mouth every 4 (four) hours as needed for pain. Patient not taking: Reported on 07/06/2014 05/20/14   Ezekiel Slocumb, PA-C  sildenafil (VIAGRA) 50 MG tablet Take 1 tablet (50 mg total) by mouth daily as needed for erectile dysfunction. Patient not taking: Reported on 07/06/2014 06/10/12   Clanford Marisa Hua, MD  traMADol (ULTRAM) 50 MG tablet Take 1 tablet (50 mg total) by mouth every 8 (eight) hours as needed. 07/06/14   Tresa Garter, MD     Objective:   Filed Vitals:   07/06/14 1227  BP: 131/91  Pulse: 62  Temp: 98.4 F (36.9 C)  TempSrc: Oral  Resp: 18  Height: 6' (1.829 m)  Weight: 215 lb (97.523 kg)  SpO2: 97%    Exam General appearance : Awake, alert, not in any distress. Speech Clear. Not toxic looking HEENT: Atraumatic and Normocephalic, pupils equally reactive to light and accomodation Neck: supple, no JVD. No cervical lymphadenopathy.  Chest:Good air entry bilaterally, no added sounds  CVS: S1 S2 regular, no murmurs.  Abdomen: Bowel sounds present, Non tender and not distended with no gaurding, rigidity or rebound. Extremities: B/L Lower Ext shows no edema, both legs are warm to touch Neurology: Awake alert, and oriented X 3, CN II-XII intact, Non focal Skin:No Rash  Data Review Lab Results  Component Value Date  HGBA1C 5.7* 06/10/2012     Assessment & Plan   1. Back pain at L4-L5 level  - traMADol (ULTRAM) 50 MG tablet; Take 1 tablet (50 mg total) by mouth every 8 (eight) hours as needed.  Dispense: 90 tablet; Refill: 0  Corey Hale was counseled on the dangers of tobacco use, and was advised to quit. Reviewed strategies to maximize success, including removing cigarettes and smoking materials from environment, stress management and support of family/friends.   Patient have been counseled extensively about nutrition and exercise Return in about 6 months (around 01/05/2015) for Follow up HTN, Annual Physical.  The  patient was given clear instructions to go to ER or return to medical center if symptoms don't improve, worsen or new problems develop. The patient verbalized understanding. The patient was told to call to get lab results if they haven't heard anything in the next week.   This note has been created with Surveyor, quantity. Any transcriptional errors are unintentional.    Angelica Chessman, MD, [place], [place], [place], [place] and [place], [place]   07/06/2014, 12:44 PM

## 2014-07-23 ENCOUNTER — Encounter (HOSPITAL_COMMUNITY): Payer: Self-pay | Admitting: Nurse Practitioner

## 2014-07-23 ENCOUNTER — Emergency Department (HOSPITAL_COMMUNITY)
Admission: EM | Admit: 2014-07-23 | Discharge: 2014-07-23 | Disposition: A | Discharge status: Home / Self Care | Payer: Self-pay | Attending: Emergency Medicine | Admitting: Emergency Medicine

## 2014-07-23 DIAGNOSIS — I10 Essential (primary) hypertension: Secondary | ICD-10-CM | POA: Insufficient documentation

## 2014-07-23 DIAGNOSIS — E785 Hyperlipidemia, unspecified: Secondary | ICD-10-CM | POA: Insufficient documentation

## 2014-07-23 DIAGNOSIS — Z79899 Other long term (current) drug therapy: Secondary | ICD-10-CM | POA: Insufficient documentation

## 2014-07-23 DIAGNOSIS — Z7982 Long term (current) use of aspirin: Secondary | ICD-10-CM | POA: Insufficient documentation

## 2014-07-23 DIAGNOSIS — G8929 Other chronic pain: Secondary | ICD-10-CM | POA: Insufficient documentation

## 2014-07-23 DIAGNOSIS — G44229 Chronic tension-type headache, not intractable: Secondary | ICD-10-CM | POA: Insufficient documentation

## 2014-07-23 DIAGNOSIS — Z791 Long term (current) use of non-steroidal anti-inflammatories (NSAID): Secondary | ICD-10-CM | POA: Insufficient documentation

## 2014-07-23 DIAGNOSIS — Z87891 Personal history of nicotine dependence: Secondary | ICD-10-CM | POA: Insufficient documentation

## 2014-07-23 MED ORDER — SODIUM CHLORIDE 0.9 % IV BOLUS (SEPSIS)
1000.0000 mL | Freq: Once | INTRAVENOUS | Status: AC
  Administered 2014-07-23: 1000 mL via INTRAVENOUS

## 2014-07-23 MED ORDER — KETOROLAC TROMETHAMINE 30 MG/ML IJ SOLN
30.0000 mg | Freq: Once | INTRAMUSCULAR | Status: AC
  Administered 2014-07-23: 30 mg via INTRAVENOUS
  Filled 2014-07-23: qty 1

## 2014-07-23 MED ORDER — PROCHLORPERAZINE EDISYLATE 5 MG/ML IJ SOLN
10.0000 mg | Freq: Once | INTRAMUSCULAR | Status: AC
  Administered 2014-07-23: 10 mg via INTRAVENOUS
  Filled 2014-07-23: qty 2

## 2014-07-23 MED ORDER — DIPHENHYDRAMINE HCL 50 MG/ML IJ SOLN
25.0000 mg | Freq: Once | INTRAMUSCULAR | Status: AC
Start: 2014-07-23 — End: 2014-07-23
  Administered 2014-07-23: 25 mg via INTRAVENOUS
  Filled 2014-07-23: qty 1

## 2014-07-23 NOTE — ED Provider Notes (Signed)
CSN: [id number]     Arrival date & time 07/23/14  1811 History   First MD Initiated Contact with Patient 07/23/14 2003     Chief Complaint  Patient presents with  . Headache     (Consider location/radiation/quality/duration/timing/severity/associated sxs/prior Treatment) HPI   This is a 49 year old male who presents emergency with chief complaint of headache. This is a recurrent problem for the patient. He has a history of back pain for which she takes tramadol. The patient complains of pain in the occipital region, which was very bad today, and uncontrolled with ibuprofen. He endorses stress at home. Denies photophobia, phonophobia, UL throbbing, N/V, visual changes, stiff neck, neck pain, rash, or "thunderclap" onset.   Past Medical History  Diagnosis Date  . Hypertension   . Hypercholesteremia   . Thyroid disease   . Chronic back pain    Past Surgical History  Procedure Laterality Date  . Neck surgery    . Arm hardware removal Right    Family History  Problem Relation Age of Onset  . Stroke Mother   . Stroke Father    History  Substance Use Topics  . Smoking status: Former Research scientist (life sciences)  . Smokeless tobacco: Never Used  . Alcohol Use: No    Review of Systems  Ten systems reviewed and are negative for acute change, except as noted in the HPI.    Allergies  Review of patient's allergies indicates no known allergies.  Home Medications   Prior to Admission medications   Medication Sig Start Date End Date Taking? Authorizing Provider  aspirin 81 MG tablet Take 81 mg by mouth daily.   Yes Historical Provider, MD  pravastatin (PRAVACHOL) 20 MG tablet Take 10 mg by mouth daily.   Yes Historical Provider, MD  traMADol (ULTRAM) 50 MG tablet Take 1 tablet (50 mg total) by mouth every 8 (eight) hours as needed. Patient taking differently: Take 50 mg by mouth every 8 (eight) hours as needed for moderate pain.  07/06/14  Yes Tresa Garter, MD  atenolol-chlorthalidone  (TENORETIC) 50-25 MG per tablet Take 1 tablet by mouth daily. Patient not taking: Reported on 07/23/2014 02/23/14   Tresa Garter, MD  ibuprofen (ADVIL,MOTRIN) 800 MG tablet Take 1 tablet (800 mg total) by mouth 3 (three) times daily. Patient not taking: Reported on 07/06/2014 06/20/14   Orlie Dakin, MD  naproxen (NAPROSYN) 500 MG tablet Take 1 tablet (500 mg total) by mouth 2 (two) times daily. Patient not taking: Reported on 07/06/2014 04/06/14   Charlesetta Shanks, MD  orphenadrine (NORFLEX) 100 MG tablet Take 1 tablet (100 mg total) by mouth 2 (two) times daily. Patient not taking: Reported on 07/06/2014 04/13/14   Robyn Haber, MD  oxyCODONE (ROXICODONE) 15 MG immediate release tablet Take 1 tablet (15 mg total) by mouth every 4 (four) hours as needed for pain. Patient not taking: Reported on 07/06/2014 05/20/14   Ezekiel Slocumb, PA-C  sildenafil (VIAGRA) 50 MG tablet Take 1 tablet (50 mg total) by mouth daily as needed for erectile dysfunction. Patient not taking: Reported on 07/06/2014 06/10/12   Clanford L Johnson, MD   BP 146/97 mmHg  Pulse 56  Temp(Src) 98.1 F (36.7 C) (Oral)  Resp 16  SpO2 100% Physical Exam  Constitutional: He is oriented to person, place, and time. He appears well-developed and well-nourished. No distress.  HENT:  Head: Normocephalic and atraumatic.  Mouth/Throat: Oropharynx is clear and moist.  Eyes: Conjunctivae and EOM are normal. Pupils are equal, round,  and reactive to light. No scleral icterus.  No horizontal, vertical or rotational nystagmus  Neck: Normal range of motion. Neck supple.  Full active and passive ROM without pain No midline or paraspinal tenderness No nuchal rigidity or meningeal signs Tender to palpation in the occipital region and  Cardiovascular: Normal rate, regular rhythm and intact distal pulses.   Pulmonary/Chest: Effort normal and breath sounds normal. No respiratory distress. He has no wheezes. He has no rales.  Abdominal: Soft.  Bowel sounds are normal. There is no tenderness. There is no rebound and no guarding.  Musculoskeletal: Normal range of motion.  Lymphadenopathy:    He has no cervical adenopathy.  Neurological: He is alert and oriented to person, place, and time. He has normal reflexes. No cranial nerve deficit. He exhibits normal muscle tone. Coordination normal.  Mental Status:  Alert, oriented, thought content appropriate. Speech fluent without evidence of aphasia. Able to follow 2 step commands without difficulty.  Cranial Nerves:  II:  Peripheral visual fields grossly normal, pupils equal, round, reactive to light III,IV, VI: ptosis not present, extra-ocular motions intact bilaterally  V,VII: smile symmetric, facial light touch sensation equal VIII: hearing grossly normal bilaterally  IX,X: gag reflex present  XI: bilateral shoulder shrug equal and strong XII: midline tongue extension  Motor:  5/5 in upper and lower extremities bilaterally including strong and equal grip strength and dorsiflexion/plantar flexion Sensory: Pinprick and light touch normal in all extremities.  Deep Tendon Reflexes: 2+ and symmetric  Cerebellar: normal finger-to-nose with bilateral upper extremities Gait: normal gait and balance CV: distal pulses palpable throughout   Skin: Skin is warm and dry. No rash noted. He is not diaphoretic.  Psychiatric: He has a normal mood and affect. His behavior is normal. Judgment and thought content normal.  Nursing note and vitals reviewed.   ED Course  Procedures (including critical care time) Labs Review Labs Reviewed - No data to display  Imaging Review No results found.   EKG Interpretation None      MDM   Final diagnoses:  Chronic tension-type headache, not intractable    Pt HA treated and improved while in ED.  Presentation is like pts typical HA and non concerning for [facility], ICH, Meningitis, or temporal arteritis. Pt is afebrile with no focal neuro deficits, nuchal  rigidity, or change in vision.   Patient with what appears to be a tension headache. Patient eloped prior to final evaluation.      Margarita Mail, PA-C 07/24/14 0002  Elnora Morrison, MD 07/24/14 [phone]

## 2014-07-23 NOTE — ED Notes (Signed)
He c/o posterior headaCHE INtermittent x several weeks. Today the pain is worse. He took 4 tramadol with no relief. He denies vision changes, n/v. He is A&Ox4, resp e/u

## 2014-07-26 ENCOUNTER — Ambulatory Visit: Payer: Self-pay

## 2014-08-19 ENCOUNTER — Ambulatory Visit: Payer: Self-pay | Attending: Internal Medicine | Admitting: Internal Medicine

## 2014-08-19 ENCOUNTER — Encounter: Payer: Self-pay | Admitting: Internal Medicine

## 2014-08-19 VITALS — BP 130/90 | HR 73 | Temp 98.2°F | Resp 16 | Ht 73.0 in | Wt 211.0 lb

## 2014-08-19 DIAGNOSIS — M545 Low back pain, unspecified: Secondary | ICD-10-CM

## 2014-08-19 DIAGNOSIS — I10 Essential (primary) hypertension: Secondary | ICD-10-CM

## 2014-08-19 MED ORDER — TRAMADOL HCL 50 MG PO TABS: 50.0000 mg | ORAL_TABLET | Freq: Three times a day (TID) | ORAL | Status: DC | PRN

## 2014-08-19 NOTE — Patient Instructions (Signed)
Back Pain, Adult Low back pain is very common. About 1 in 5 people have back pain.The cause of low back pain is rarely dangerous. The pain often gets better over time.About half of people with a sudden onset of back pain feel better in just 2 weeks. About 8 in 10 people feel better by 6 weeks.  CAUSES Some common causes of back pain include:  Strain of the muscles or ligaments supporting the spine.  Wear and tear (degeneration) of the spinal discs.  Arthritis.  Direct injury to the back. DIAGNOSIS Most of the time, the direct cause of low back pain is not known.However, back pain can be treated effectively even when the exact cause of the pain is unknown.Answering your caregiver's questions about your overall health and symptoms is one of the most accurate ways to make sure the cause of your pain is not dangerous. If your caregiver needs more information, he or she may order lab work or imaging tests (X-rays or MRIs).However, even if imaging tests show changes in your back, this usually does not require surgery. HOME CARE INSTRUCTIONS For many people, back pain returns.Since low back pain is rarely dangerous, it is often a condition that people can learn to manageon their own.   Remain active. It is stressful on the back to sit or stand in one place. Do not sit, drive, or stand in one place for more than 30 minutes at a time. Take short walks on level surfaces as soon as pain allows.Try to increase the length of time you walk each day.  Do not stay in bed.Resting more than 1 or 2 days can delay your recovery.  Do not avoid exercise or work.Your body is made to move.It is not dangerous to be active, even though your back may hurt.Your back will likely heal faster if you return to being active before your pain is gone.  Pay attention to your body when you bend and lift. Many people have less discomfortwhen lifting if they bend their knees, keep the load close to their bodies,and  avoid twisting. Often, the most comfortable positions are those that put less stress on your recovering back.  Find a comfortable position to sleep. Use a firm mattress and lie on your side with your knees slightly bent. If you lie on your back, put a pillow under your knees.  Only take over-the-counter or prescription medicines as directed by your caregiver. Over-the-counter medicines to reduce pain and inflammation are often the most helpful.Your caregiver may prescribe muscle relaxant drugs.These medicines help dull your pain so you can more quickly return to your normal activities and healthy exercise.  Put ice on the injured area.  Put ice in a plastic bag.  Place a towel between your skin and the bag.  Leave the ice on for 15-20 minutes, 03-04 times a day for the first 2 to 3 days. After that, ice and heat may be alternated to reduce pain and spasms.  Ask your caregiver about trying back exercises and gentle massage. This may be of some benefit.  Avoid feeling anxious or stressed.Stress increases muscle tension and can worsen back pain.It is important to recognize when you are anxious or stressed and learn ways to manage it.Exercise is a great option. SEEK MEDICAL CARE IF:  You have pain that is not relieved with rest or medicine.  You have pain that does not improve in 1 week.  You have new symptoms.  You are generally not feeling well. SEEK   IMMEDIATE MEDICAL CARE IF:   You have pain that radiates from your back into your legs.  You develop new bowel or bladder control problems.  You have unusual weakness or numbness in your arms or legs.  You develop nausea or vomiting.  You develop abdominal pain.  You feel faint. Document Released: 03/05/2005 Document Revised: 09/04/2011 Document Reviewed: 07/07/2013 ExitCare Patient Information 2015 [employer]. This information is not intended to replace advice given to you by your health care provider. Make sure you  discuss any questions you have with your health care provider.  

## 2014-08-19 NOTE — Progress Notes (Signed)
Patient needs refill on Tramadol for his chronic back pain No other issues
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
## 2014-08-30 ENCOUNTER — Ambulatory Visit (INDEPENDENT_AMBULATORY_CARE_PROVIDER_SITE_OTHER): Payer: Self-pay | Admitting: Family Medicine

## 2014-08-30 VITALS — BP 120/90 | HR 72 | Temp 98.5°F | Resp 18 | Wt 206.0 lb

## 2014-08-30 DIAGNOSIS — M546 Pain in thoracic spine: Secondary | ICD-10-CM

## 2014-08-30 DIAGNOSIS — G894 Chronic pain syndrome: Secondary | ICD-10-CM

## 2014-08-30 MED ORDER — OXYCODONE HCL 15 MG PO TABS: 15.0000 mg | ORAL_TABLET | Freq: Three times a day (TID) | ORAL | Status: DC | PRN
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
## 2014-08-30 NOTE — Patient Instructions (Addendum)
We do not prescribe chronic pain medication here.  I have refilled your oxycodone this once since you are going to be able to get into a pain clinic soon.  I would recommend that you call around to figure out where you want to go - who accepts medicaid - and go ahead and get an appointment scheduled asap as you are likely to have several weeks after calling until their first available appointment. Try [facility] - if they can't work with you you can look on-line or in the phone book to find others.

## 2014-09-30 ENCOUNTER — Ambulatory Visit: Payer: Self-pay

## 2014-10-07 ENCOUNTER — Ambulatory Visit (INDEPENDENT_AMBULATORY_CARE_PROVIDER_SITE_OTHER): Payer: Self-pay | Admitting: Emergency Medicine

## 2014-10-07 ENCOUNTER — Telehealth: Payer: Self-pay

## 2014-10-07 VITALS — BP 128/70 | HR 80 | Temp 98.0°F | Resp 16 | Ht 72.0 in | Wt 214.0 lb

## 2014-10-07 DIAGNOSIS — G894 Chronic pain syndrome: Secondary | ICD-10-CM

## 2014-10-07 DIAGNOSIS — Z8781 Personal history of (healed) traumatic fracture: Secondary | ICD-10-CM

## 2014-10-07 DIAGNOSIS — M546 Pain in thoracic spine: Secondary | ICD-10-CM

## 2014-10-07 MED ORDER — OXYCODONE HCL 5 MG PO TABS: 5.0000 mg | ORAL_TABLET | Freq: Four times a day (QID) | ORAL | Status: DC | PRN

## 2014-10-07 NOTE — Patient Instructions (Addendum)
[facility] Policy for Prescribing Controlled Substances (Revised 01/2012) 1. Prescriptions for controlled substances will be filled by ONE provider at [facility] with whom you have established and developed a plan for your care, including follow-up. 2. You are encouraged to schedule an appointment with your prescriber at our appointment center for follow-up visits whenever possible. 3. If you request a prescription for the controlled substance while at [facility] for an acute problem (with someone other than your regular prescriber), you MAY be given a ONE-TIME prescription for a 30-day supply of the controlled substance, to allow time for you to return to see your regular prescriber for additional prescriptions. 4.    cChronic Back Pain  When back pain lasts longer than 3 months, it is called chronic back pain.People with chronic back pain often go through certain periods that are more intense (flare-ups).  CAUSES Chronic back pain can be caused by wear and tear (degeneration) on different structures in your back. These structures include:  The bones of your spine (vertebrae) and the joints surrounding your spinal cord and nerve roots (facets).  The strong, fibrous tissues that connect your vertebrae (ligaments). Degeneration of these structures may result in pressure on your nerves. This can lead to constant pain. HOME CARE INSTRUCTIONS  Avoid bending, heavy lifting, prolonged sitting, and activities which make the problem worse.  Take brief periods of rest throughout the day to reduce your pain. Lying down or standing usually is better than sitting while you are resting.  Take over-the-counter or prescription medicines only as directed by your caregiver. SEEK IMMEDIATE MEDICAL CARE IF:   You have weakness or numbness in one of your legs or feet.  You have trouble controlling your bladder or bowels.  You have nausea, vomiting, abdominal pain, shortness of breath, or fainting. Document Released:  04/12/2004 Document Revised: 05/28/2011 Document Reviewed: 02/17/2011 [facility] Patient Information 2015 [place], [place]. This information is not intended to replace advice given to you by your health care provider. Make sure you discuss any questions you have with your health care provider.

## 2014-10-07 NOTE — Progress Notes (Signed)
Subjective:  Patient ID: Corey Hale, male    DOB: 06/28/1965  Age: 49 y.o. MRN: [id number]  CC: Back Pain; Medication Refill; and OTHER   HPI Corey Hale presents  for a refill on his pain pill. He claims to only be seen here regularly and obtains either tramadol or oxycodone here. He said that he's been referred to a chronic pain management program in the past but he couldn't afford it. He now has Medicaid and has been in touch with [facility]ge pain clinic and said that if he is referred there he'll have an appointment within 3 weeks. On review of his medical records is been seen by numerous practitioners here he's been seen by an internist and also by the emergency room all obtaining narcotics for his back pain. He denies any acute injury. At times feigns not understanding what what questions that I asked him and fails answer them.  History Corey Hale has a past medical history of Hypertension; Hypercholesteremia; Thyroid disease; and Chronic back pain.   He has past surgical history that includes Neck surgery and Arm hardware removal (Right).   His  family history includes Stroke in his father and mother.  He   reports that he has been smoking.  He has never used smokeless tobacco. He reports that he does not drink alcohol or use illicit drugs.  Outpatient Prescriptions Prior to Visit  Medication Sig Dispense Refill  . aspirin 81 MG tablet Take 81 mg by mouth daily.    Marland Kitchen atenolol-chlorthalidone (TENORETIC) 50-25 MG per tablet Take 1 tablet by mouth daily. 90 tablet 3  . oxyCODONE (ROXICODONE) 15 MG immediate release tablet Take 1 tablet (15 mg total) by mouth every 8 (eight) hours as needed for pain. 30 tablet 0  . pravastatin (PRAVACHOL) 20 MG tablet Take 10 mg by mouth daily.    . traMADol (ULTRAM) 50 MG tablet Take 1 tablet (50 mg total) by mouth every 8 (eight) hours as needed for moderate pain. 90 tablet 0  . sildenafil (VIAGRA) 50 MG tablet Take 1 tablet (50 mg total) by mouth daily  as needed for erectile dysfunction. (Patient not taking: Reported on 08/30/2014) 10 tablet 0   No facility-administered medications prior to visit.    History   Social History  . Marital Status: Married    Spouse Name: N/A  . Number of Children: N/A  . Years of Education: N/A   Social History Main Topics  . Smoking status: Current Some Day Smoker  . Smokeless tobacco: Never Used  . Alcohol Use: No  . Drug Use: No  . Sexual Activity: Not on file   Other Topics Concern  . None   Social History Narrative   ** Merged History Encounter **         Review of Systems  Constitutional: Negative for fever, chills and appetite change.  HENT: Negative for congestion, ear pain, postnasal drip, sinus pressure and sore throat.   Eyes: Negative for pain and redness.  Respiratory: Negative for cough, shortness of breath and wheezing.   Cardiovascular: Negative for leg swelling.  Gastrointestinal: Negative for nausea, vomiting, abdominal pain, diarrhea, constipation and blood in stool.  Endocrine: Negative for polyuria.  Genitourinary: Negative for dysuria, urgency, frequency and flank pain.  Musculoskeletal: Negative for gait problem.  Skin: Negative for rash.  Neurological: Negative for weakness and headaches.  Psychiatric/Behavioral: Negative for confusion and decreased concentration. The patient is not nervous/anxious.     Objective:  BP 128/70  mmHg  Pulse 80  Temp(Src) 98 F (36.7 C) (Oral)  Resp 16  Ht 6' (1.829 m)  Wt 214 lb (97.07 kg)  BMI 29.02 kg/m2  SpO2 98%  Physical Exam  Constitutional: He is oriented to person, place, and time. He appears well-developed and well-nourished.  HENT:  Head: Normocephalic and atraumatic.  Eyes: Conjunctivae are normal. Pupils are equal, round, and reactive to light.  Pulmonary/Chest: Effort normal.  Musculoskeletal: He exhibits no edema.  Neurological: He is alert and oriented to person, place, and time.  Skin: Skin is dry.    Psychiatric: He has a normal mood and affect. His behavior is normal. Thought content normal.      Assessment & Plan:   Corey Hale was seen today for back pain, medication refill and other.  Diagnoses and all orders for this visit:  Chronic pain syndrome Orders: -     Ambulatory referral to Pain Clinic  History of vertebral fracture Orders: -     Ambulatory referral to Pain Clinic  Pain in thoracic spine Orders: -     Ambulatory referral to Pain Clinic  Other orders -     oxyCODONE (OXY IR/ROXICODONE) 5 MG immediate release tablet; Take 1 tablet (5 mg total) by mouth every 6 (six) hours as needed for severe pain.   I am having Corey Hale start on oxyCODONE. I am also having him maintain his sildenafil, atenolol-chlorthalidone, aspirin, pravastatin, traMADol, and oxyCODONE.  Meds ordered this encounter  Medications  . oxyCODONE (OXY IR/ROXICODONE) 5 MG immediate release tablet    Sig: Take 1 tablet (5 mg total) by mouth every 6 (six) hours as needed for severe pain.    Dispense:  90 tablet    Refill:  0   I suggested the patient that he be followed up with pain management program. He indicates that he has made arrangements to see headache as soon as he has an official referral now has insurance. He denies being seen in the emergency room by another practice for pain medicine despite being shown that in the computer on the study drug sheet. I told him that I would give him sufficient pain medicine to reduce dose from his oxycodone IR 15 5 sufficient to see him through for 3 weeks to see the pain clinic doctor after that I don't expect him to come back here for pain medicine at all under any circumstances. I said he was welcome for any other treatment but no more pain medicine will be dispensed by his office   Appropriate red flag conditions were discussed with the patient as well as actions that should be taken.  Patient expressed his understanding.  Follow-up: Return if symptoms  worsen or fail to improve.  Roselee Culver, MD

## 2014-10-07 NOTE — Telephone Encounter (Signed)
Left message for pt letting him know

## 2014-10-07 NOTE — Telephone Encounter (Signed)
Patient left without picking up the prescription for oxycodone  at check out. I placed the prescription in the pick up drawer.

## 2014-11-01 ENCOUNTER — Other Ambulatory Visit: Payer: Self-pay | Admitting: Otolaryngology

## 2014-11-18 ENCOUNTER — Ambulatory Visit: Payer: Self-pay | Admitting: Family Medicine

## 2014-11-18 ENCOUNTER — Telehealth: Payer: Self-pay | Admitting: Internal Medicine

## 2014-11-26 ENCOUNTER — Ambulatory Visit: Payer: Self-pay | Attending: Family Medicine | Admitting: Family Medicine

## 2014-11-26 ENCOUNTER — Encounter: Payer: Self-pay | Admitting: Family Medicine

## 2014-11-26 VITALS — BP 124/85 | HR 67 | Temp 98.3°F | Resp 18 | Ht 73.0 in | Wt 209.2 lb

## 2014-11-26 DIAGNOSIS — M545 Low back pain, unspecified: Secondary | ICD-10-CM

## 2014-11-26 DIAGNOSIS — Z720 Tobacco use: Secondary | ICD-10-CM | POA: Insufficient documentation

## 2014-11-26 DIAGNOSIS — M546 Pain in thoracic spine: Secondary | ICD-10-CM | POA: Insufficient documentation

## 2014-11-26 DIAGNOSIS — I10 Essential (primary) hypertension: Secondary | ICD-10-CM | POA: Insufficient documentation

## 2014-11-26 MED ORDER — ATENOLOL-CHLORTHALIDONE 50-25 MG PO TABS: 1.0000 | ORAL_TABLET | Freq: Every day | ORAL | Status: DC

## 2014-11-26 MED ORDER — ACETAMINOPHEN-CODEINE #3 300-30 MG PO TABS: 1.0000 | ORAL_TABLET | Freq: Three times a day (TID) | ORAL | Status: DC | PRN

## 2014-11-26 NOTE — Progress Notes (Signed)
Patient is here for refills on B/P med and pain medication.  Patient has pain in middle and lower back. Pain during his sleep. Scaled at 6, described as a sharp pain.   Patient did not take B/P med today, last dose was taken yesterday. Patient took last dose of oxycodone 3 months ago.  Patient requesting refill.

## 2014-11-26 NOTE — Patient Instructions (Signed)
It was a pleasure to see you today.    For your blood pressure, I sent a refill for your Atenoretic to the [facility].   I am giving you a printed prescription for Tylenol #3, 1 tablet by mouth every 8 hours as needed for severe pain.   Follow up with your primary doctor in 3 to 6 months or as needed.
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
## 2014-11-26 NOTE — Assessment & Plan Note (Signed)
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  No findings or red flag symptoms.  Icy hot helpful.  T#3 for prn relief.   Also reports thoracic back pain

## 2014-11-27 LAB — DRUG SCR UR, PAIN MGMT, REFLEX CONF
Amphetamine Screen, Ur: NEGATIVE
BARBITURATE QUANT UR: NEGATIVE
Benzodiazepines.: NEGATIVE
Cocaine Metabolites: NEGATIVE
Creatinine,U: 171.66 mg/dL
MARIJUANA METABOLITE: NEGATIVE
METHADONE: NEGATIVE
Opiates: NEGATIVE
PROPOXYPHENE: NEGATIVE
Phencyclidine (PCP): NEGATIVE

## 2015-02-04 ENCOUNTER — Emergency Department (INDEPENDENT_AMBULATORY_CARE_PROVIDER_SITE_OTHER)
Admission: EM | Admit: 2015-02-04 | Discharge: 2015-02-04 | Disposition: A | Discharge status: Home / Self Care | Payer: Self-pay | Source: Home / Self Care

## 2015-02-04 ENCOUNTER — Encounter (HOSPITAL_COMMUNITY): Payer: Self-pay | Admitting: Emergency Medicine

## 2015-02-04 DIAGNOSIS — M546 Pain in thoracic spine: Secondary | ICD-10-CM

## 2015-02-04 MED ORDER — TRAMADOL HCL 50 MG PO TABS: 50.0000 mg | ORAL_TABLET | Freq: Four times a day (QID) | ORAL | Status: DC | PRN

## 2015-02-04 NOTE — Discharge Instructions (Signed)

## 2015-02-04 NOTE — ED Notes (Signed)
C/o back pain onset 2 days; hx of chronic back pain Denies inj/trauma Reports he ran out of his tramadol and is needing refills A&O x4... No acute distress.
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
## 2015-03-07 ENCOUNTER — Encounter: Payer: Self-pay | Admitting: Internal Medicine

## 2015-03-07 ENCOUNTER — Ambulatory Visit: Payer: Self-pay | Attending: Internal Medicine | Admitting: Internal Medicine

## 2015-03-07 VITALS — BP 134/88 | HR 77 | Temp 98.5°F | Resp 18 | Ht 73.0 in | Wt 220.4 lb

## 2015-03-07 DIAGNOSIS — E785 Hyperlipidemia, unspecified: Secondary | ICD-10-CM

## 2015-03-07 DIAGNOSIS — N529 Male erectile dysfunction, unspecified: Secondary | ICD-10-CM

## 2015-03-07 DIAGNOSIS — E079 Disorder of thyroid, unspecified: Secondary | ICD-10-CM | POA: Insufficient documentation

## 2015-03-07 DIAGNOSIS — Z7982 Long term (current) use of aspirin: Secondary | ICD-10-CM | POA: Insufficient documentation

## 2015-03-07 DIAGNOSIS — G8929 Other chronic pain: Secondary | ICD-10-CM | POA: Insufficient documentation

## 2015-03-07 DIAGNOSIS — I10 Essential (primary) hypertension: Secondary | ICD-10-CM

## 2015-03-07 DIAGNOSIS — M546 Pain in thoracic spine: Secondary | ICD-10-CM

## 2015-03-07 DIAGNOSIS — M545 Low back pain: Secondary | ICD-10-CM | POA: Insufficient documentation

## 2015-03-07 MED ORDER — PRAVASTATIN SODIUM 20 MG PO TABS: 10.0000 mg | ORAL_TABLET | Freq: Every day | ORAL | Status: DC

## 2015-03-07 MED ORDER — HYDROXYZINE HCL 25 MG PO TABS: 25.0000 mg | ORAL_TABLET | Freq: Three times a day (TID) | ORAL | Status: DC | PRN

## 2015-03-07 MED ORDER — ATENOLOL-CHLORTHALIDONE 50-25 MG PO TABS: 1.0000 | ORAL_TABLET | Freq: Every day | ORAL | Status: DC

## 2015-03-07 MED ORDER — SILDENAFIL CITRATE 50 MG PO TABS: 50.0000 mg | ORAL_TABLET | Freq: Every day | ORAL | Status: DC | PRN

## 2015-03-07 MED ORDER — TRAMADOL HCL 50 MG PO TABS: 50.0000 mg | ORAL_TABLET | Freq: Four times a day (QID) | ORAL | Status: DC | PRN

## 2015-03-07 MED ORDER — ASPIRIN 81 MG PO TABS: 81.0000 mg | ORAL_TABLET | Freq: Every day | ORAL | Status: AC

## 2015-03-07 NOTE — Patient Instructions (Signed)
Back Pain, Adult °Back pain is very common in adults. The cause of back pain is rarely dangerous and the pain often gets better over time. The cause of your back pain may not be known. Some common causes of back pain include: °· Strain of the muscles or ligaments supporting the spine. °· Wear and tear (degeneration) of the spinal disks. °· Arthritis. °· Direct injury to the back. °For many people, back pain may return. Since back pain is rarely dangerous, most people can learn to manage this condition on their own. °HOME CARE INSTRUCTIONS °Watch your back pain for any changes. The following actions may help to lessen any discomfort you are feeling: °· Remain active. It is stressful on your back to sit or stand in one place for long periods of time. Do not sit, drive, or stand in one place for more than 30 minutes at a time. Take short walks on even surfaces as soon as you are able. Try to increase the length of time you walk each day. °· Exercise regularly as directed by your health care provider. Exercise helps your back heal faster. It also helps avoid future injury by keeping your muscles strong and flexible. °· Do not stay in bed. Resting more than 1-2 days can delay your recovery. °· Pay attention to your body when you bend and lift. The most comfortable positions are those that put less stress on your recovering back. Always use proper lifting techniques, including: °· Bending your knees. °· Keeping the load close to your body. °· Avoiding twisting. °· Find a comfortable position to sleep. Use a firm mattress and lie on your side with your knees slightly bent. If you lie on your back, put a pillow under your knees. °· Avoid feeling anxious or stressed. Stress increases muscle tension and can worsen back pain. It is important to recognize when you are anxious or stressed and learn ways to manage it, such as with exercise. °· Take medicines only as directed by your health care provider. Over-the-counter  medicines to reduce pain and inflammation are often the most helpful. Your health care provider may prescribe muscle relaxant drugs. These medicines help dull your pain so you can more quickly return to your normal activities and healthy exercise. °· Apply ice to the injured area: °· Put ice in a plastic bag. °· Place a towel between your skin and the bag. °· Leave the ice on for 20 minutes, 2-3 times a day for the first 2-3 days. After that, ice and heat may be alternated to reduce pain and spasms. °· Maintain a healthy weight. Excess weight puts extra stress on your back and makes it difficult to maintain good posture. °SEEK MEDICAL CARE IF: °· You have pain that is not relieved with rest or medicine. °· You have increasing pain going down into the legs or buttocks. °· You have pain that does not improve in one week. °· You have night pain. °· You lose weight. °· You have a fever or chills. °SEEK IMMEDIATE MEDICAL CARE IF:  °· You develop new bowel or bladder control problems. °· You have unusual weakness or numbness in your arms or legs. °· You develop nausea or vomiting. °· You develop abdominal pain. °· You feel faint. °  °This information is not intended to replace advice given to you by your health care provider. Make sure you discuss any questions you have with your health care provider. °  °Document Released: 03/05/2005 Document Revised: 03/26/2014 Document Reviewed: 07/07/2013 °Elsevier Interactive Patient Education ©2016 Elsevier   Inc[employer] DASH Eating Plan DASH stands for "Dietary Approaches to Stop Hypertension." The DASH eating plan is a healthy eating plan that has been shown to reduce high blood pressure (hypertension). Additional health benefits may include reducing the risk of type 2 diabetes mellitus, heart disease, and stroke. The DASH eating plan may also help with weight loss. WHAT DO I NEED TO KNOW ABOUT THE DASH EATING PLAN? For the DASH eating plan, you will follow these general  guidelines:  Choose foods with a percent daily value for sodium of less than 5% (as listed on the food label).  Use salt-free seasonings or herbs instead of table salt or sea salt.  Check with your health care provider or pharmacist before using salt substitutes.  Eat lower-sodium products, often labeled as "lower sodium" or "no salt added."  Eat fresh foods.  Eat more vegetables, fruits, and low-fat dairy products.  Choose whole grains. Look for the word "whole" as the first word in the ingredient list.  Choose fish and skinless chicken or turkey more often than red meat. Limit fish, poultry, and meat to 6 oz (170 g) each day.  Limit sweets, desserts, sugars, and sugary drinks.  Choose heart-healthy fats.  Limit cheese to 1 oz (28 g) per day.  Eat more home-cooked food and less restaurant, buffet, and fast food.  Limit fried foods.  Cook foods using methods other than frying.  Limit canned vegetables. If you do use them, rinse them well to decrease the sodium.  When eating at a restaurant, ask that your food be prepared with less salt, or no salt if possible. WHAT FOODS CAN I EAT? Seek help from a dietitian for individual calorie needs. Grains Whole grain or whole wheat bread. Brown rice. Whole grain or whole wheat pasta. Quinoa, bulgur, and whole grain cereals. Low-sodium cereals. Corn or whole wheat flour tortillas. Whole grain cornbread. Whole grain crackers. Low-sodium crackers. Vegetables Fresh or frozen vegetables (raw, steamed, roasted, or grilled). Low-sodium or reduced-sodium tomato and vegetable juices. Low-sodium or reduced-sodium tomato sauce and paste. Low-sodium or reduced-sodium canned vegetables.  Fruits All fresh, canned (in natural juice), or frozen fruits. Meat and Other Protein Products Ground beef (85% or leaner), grass-fed beef, or beef trimmed of fat. Skinless chicken or turkey. Ground chicken or turkey. Pork trimmed of fat. All fish and seafood.  Eggs. Dried beans, peas, or lentils. Unsalted nuts and seeds. Unsalted canned beans. Dairy Low-fat dairy products, such as skim or 1% milk, 2% or reduced-fat cheeses, low-fat ricotta or cottage cheese, or plain low-fat yogurt. Low-sodium or reduced-sodium cheeses. Fats and Oils Tub margarines without trans fats. Light or reduced-fat mayonnaise and salad dressings (reduced sodium). Avocado. Safflower, olive, or canola oils. Natural peanut or almond butter. Other Unsalted popcorn and pretzels. The items listed above may not be a complete list of recommended foods or beverages. Contact your dietitian for more options. WHAT FOODS ARE NOT RECOMMENDED? Grains White bread. White pasta. White rice. Refined cornbread. Bagels and croissants. Crackers that contain trans fat. Vegetables Creamed or fried vegetables. Vegetables in a cheese sauce. Regular canned vegetables. Regular canned tomato sauce and paste. Regular tomato and vegetable juices. Fruits Dried fruits. Canned fruit in light or heavy syrup. Fruit juice. Meat and Other Protein Products Fatty cuts of meat. Ribs, chicken wings, bacon, sausage, bologna, salami, chitterlings, fatback, hot dogs, bratwurst, and packaged luncheon meats. Salted nuts and seeds. Canned beans with salt. Dairy Whole or 2% milk, cream, half-and-half, and cream cheese. Whole-fat or sweetened   yogurt. Full-fat cheeses or blue cheese. Nondairy creamers and whipped toppings. Processed cheese, cheese spreads, or cheese curds. Condiments Onion and garlic salt, seasoned salt, table salt, and sea salt. Canned and packaged gravies. Worcestershire sauce. Tartar sauce. Barbecue sauce. Teriyaki sauce. Soy sauce, including reduced sodium. Steak sauce. Fish sauce. Oyster sauce. Cocktail sauce. Horseradish. Ketchup and mustard. Meat flavorings and tenderizers. Bouillon cubes. Hot sauce. Tabasco sauce. Marinades. Taco seasonings. Relishes. Fats and Oils Butter, stick margarine, lard,  shortening, ghee, and bacon fat. Coconut, palm kernel, or palm oils. Regular salad dressings. Other Pickles and olives. Salted popcorn and pretzels. The items listed above may not be a complete list of foods and beverages to avoid. Contact your dietitian for more information. WHERE CAN I FIND MORE INFORMATION? National Heart, Lung, and [employer] Institute: www.nhlbi.nih.gov/health/health-topics/topics/dash/   This information is not intended to replace advice given to you by your health care provider. Make sure you discuss any questions you have with your health care provider.   Document Released: 02/22/2011 Document Revised: 03/26/2014 Document Reviewed: 01/07/2013 Elsevier Interactive Patient Education 2016 Elsevier Inc. Hypertension Hypertension, commonly called high blood pressure, is when the force of blood pumping through your arteries is too strong. Your arteries are the blood vessels that carry blood from your heart throughout your body. A blood pressure reading consists of a higher number over a lower number, such as 110/72. The higher number (systolic) is the pressure inside your arteries when your heart pumps. The lower number (diastolic) is the pressure inside your arteries when your heart relaxes. Ideally you want your blood pressure below 120/80. Hypertension forces your heart to work harder to pump blood. Your arteries may become narrow or stiff. Having untreated or uncontrolled hypertension can cause heart attack, stroke, kidney disease, and other problems. RISK FACTORS Some risk factors for high blood pressure are controllable. Others are not.  Risk factors you cannot control include:   Race. You may be at higher risk if you are African American.  Age. Risk increases with age.  Gender. Men are at higher risk than women before age 45 years. After age 65, women are at higher risk than men. Risk factors you can control include:  Not getting enough exercise or physical  activity.  Being overweight.  Getting too much fat, sugar, calories, or salt in your diet.  Drinking too much alcohol. SIGNS AND SYMPTOMS Hypertension does not usually cause signs or symptoms. Extremely high blood pressure (hypertensive crisis) may cause headache, anxiety, shortness of breath, and nosebleed. DIAGNOSIS To check if you have hypertension, your health care provider will measure your blood pressure while you are seated, with your arm held at the level of your heart. It should be measured at least twice using the same arm. Certain conditions can cause a difference in blood pressure between your right and left arms. A blood pressure reading that is higher than normal on one occasion does not mean that you need treatment. If it is not clear whether you have high blood pressure, you may be asked to return on a different day to have your blood pressure checked again. Or, you may be asked to monitor your blood pressure at home for 1 or more weeks. TREATMENT Treating high blood pressure includes making lifestyle changes and possibly taking medicine. Living a healthy lifestyle can help lower high blood pressure. You may need to change some of your habits. Lifestyle changes may include:  Following the DASH diet. This diet is high in fruits, vegetables, and   whole grains. It is low in salt, red meat, and added sugars.  Keep your sodium intake below 2,300 mg per day.  Getting at least 30-45 minutes of aerobic exercise at least 4 times per week.  Losing weight if necessary.  Not smoking.  Limiting alcoholic beverages.  Learning ways to reduce stress. Your health care provider may prescribe medicine if lifestyle changes are not enough to get your blood pressure under control, and if one of the following is true:  You are 18-59 years of age and your systolic blood pressure is above 140.  You are 60 years of age or older, and your systolic blood pressure is above 150.  Your diastolic  blood pressure is above 90.  You have diabetes, and your systolic blood pressure is over 140 or your diastolic blood pressure is over 90.  You have kidney disease and your blood pressure is above 140/90.  You have heart disease and your blood pressure is above 140/90. Your personal target blood pressure may vary depending on your medical conditions, your age, and other factors. HOME CARE INSTRUCTIONS  Have your blood pressure rechecked as directed by your health care provider.   Take medicines only as directed by your health care provider. Follow the directions carefully. Blood pressure medicines must be taken as prescribed. The medicine does not work as well when you skip doses. Skipping doses also puts you at risk for problems.  Do not smoke.   Monitor your blood pressure at home as directed by your health care provider. SEEK MEDICAL CARE IF:   You think you are having a reaction to medicines taken.  You have recurrent headaches or feel dizzy.  You have swelling in your ankles.  You have trouble with your vision. SEEK IMMEDIATE MEDICAL CARE IF:  You develop a severe headache or confusion.  You have unusual weakness, numbness, or feel faint.  You have severe chest or abdominal pain.  You vomit repeatedly.  You have trouble breathing. MAKE SURE YOU:   Understand these instructions.  Will watch your condition.  Will get help right away if you are not doing well or get worse.   This information is not intended to replace advice given to you by your health care provider. Make sure you discuss any questions you have with your health care provider.   Document Released: 03/05/2005 Document Revised: 07/20/2014 Document Reviewed: 12/26/2012 Elsevier Interactive Patient Education 2016 Elsevier Inc.  

## 2015-03-07 NOTE — Progress Notes (Signed)
Patient here for Back pain  Patient complains of chronic lower back pain scaled at a 6 currently. Pain is described as sharp pain.
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
## 2015-04-01 MED FILL — ATENOLOL/CHLORTHAL 50/25: 50-25 | 30 days supply | Qty: 30 | Fill #3

## 2015-05-02 MED FILL — ATENOLOL/CHLORTHAL 50/25: 50-25 | 30 days supply | Qty: 30 | Fill #4

## 2015-05-23 MED FILL — PRAVASTATIN NA 20 MG TAB: 20 | 30 days supply | Qty: 15 | Fill #1

## 2015-05-26 ENCOUNTER — Encounter: Payer: Self-pay | Attending: Physical Medicine & Rehabilitation

## 2015-05-26 ENCOUNTER — Ambulatory Visit (HOSPITAL_BASED_OUTPATIENT_CLINIC_OR_DEPARTMENT_OTHER): Payer: Self-pay | Admitting: Physical Medicine & Rehabilitation

## 2015-05-26 ENCOUNTER — Encounter: Payer: Self-pay | Admitting: Physical Medicine & Rehabilitation

## 2015-05-26 ENCOUNTER — Ambulatory Visit (HOSPITAL_COMMUNITY)
Admission: RE | Admit: 2015-05-26 | Discharge: 2015-05-26 | Disposition: A | Discharge status: Home / Self Care | Payer: Medicaid Other | Source: Ambulatory Visit | Attending: Physical Medicine & Rehabilitation | Admitting: Physical Medicine & Rehabilitation

## 2015-05-26 VITALS — BP 136/92 | HR 74

## 2015-05-26 DIAGNOSIS — M5489 Other dorsalgia: Secondary | ICD-10-CM | POA: Insufficient documentation

## 2015-05-26 DIAGNOSIS — M549 Dorsalgia, unspecified: Secondary | ICD-10-CM | POA: Insufficient documentation

## 2015-05-26 DIAGNOSIS — M546 Pain in thoracic spine: Secondary | ICD-10-CM

## 2015-05-26 DIAGNOSIS — Z79899 Other long term (current) drug therapy: Secondary | ICD-10-CM

## 2015-05-26 DIAGNOSIS — G8929 Other chronic pain: Secondary | ICD-10-CM | POA: Insufficient documentation

## 2015-05-26 DIAGNOSIS — M545 Low back pain: Secondary | ICD-10-CM

## 2015-05-26 DIAGNOSIS — M4854XA Collapsed vertebra, not elsewhere classified, thoracic region, initial encounter for fracture: Secondary | ICD-10-CM | POA: Insufficient documentation

## 2015-05-26 DIAGNOSIS — Z5181 Encounter for therapeutic drug level monitoring: Secondary | ICD-10-CM

## 2015-05-26 DIAGNOSIS — G894 Chronic pain syndrome: Secondary | ICD-10-CM

## 2015-05-26 DIAGNOSIS — Z8781 Personal history of (healed) traumatic fracture: Secondary | ICD-10-CM

## 2015-05-26 NOTE — Patient Instructions (Signed)
We'll get x-rays of the thoracic and lumbar spine, no stronger medications until we get xrays and urine test back
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
## 2015-05-30 ENCOUNTER — Ambulatory Visit: Payer: Self-pay | Attending: Internal Medicine | Admitting: Internal Medicine

## 2015-05-30 ENCOUNTER — Encounter: Payer: Self-pay | Admitting: Internal Medicine

## 2015-05-30 VITALS — BP 142/93 | HR 83 | Temp 98.4°F | Resp 18 | Ht 73.0 in | Wt 219.0 lb

## 2015-05-30 DIAGNOSIS — E785 Hyperlipidemia, unspecified: Secondary | ICD-10-CM

## 2015-05-30 DIAGNOSIS — Z7982 Long term (current) use of aspirin: Secondary | ICD-10-CM | POA: Insufficient documentation

## 2015-05-30 DIAGNOSIS — I10 Essential (primary) hypertension: Secondary | ICD-10-CM | POA: Insufficient documentation

## 2015-05-30 DIAGNOSIS — G8929 Other chronic pain: Secondary | ICD-10-CM | POA: Insufficient documentation

## 2015-05-30 DIAGNOSIS — E079 Disorder of thyroid, unspecified: Secondary | ICD-10-CM | POA: Insufficient documentation

## 2015-05-30 DIAGNOSIS — F17210 Nicotine dependence, cigarettes, uncomplicated: Secondary | ICD-10-CM | POA: Insufficient documentation

## 2015-05-30 DIAGNOSIS — Z79899 Other long term (current) drug therapy: Secondary | ICD-10-CM | POA: Insufficient documentation

## 2015-05-30 DIAGNOSIS — M545 Low back pain: Secondary | ICD-10-CM | POA: Insufficient documentation

## 2015-05-30 DIAGNOSIS — M549 Dorsalgia, unspecified: Secondary | ICD-10-CM | POA: Insufficient documentation

## 2015-05-30 DIAGNOSIS — E7800 Pure hypercholesterolemia, unspecified: Secondary | ICD-10-CM | POA: Insufficient documentation

## 2015-05-30 DIAGNOSIS — K029 Dental caries, unspecified: Secondary | ICD-10-CM | POA: Insufficient documentation

## 2015-05-30 MED ORDER — TRAMADOL HCL 50 MG PO TABS: 50.0000 mg | ORAL_TABLET | Freq: Three times a day (TID) | ORAL | Status: DC | PRN

## 2015-05-30 MED ORDER — CYCLOBENZAPRINE HCL 5 MG PO TABS: 5.0000 mg | ORAL_TABLET | Freq: Three times a day (TID) | ORAL | Status: DC | PRN

## 2015-05-30 MED FILL — CYCLOBENZAPRINE 5 MG TABLET: 5 | 30 days supply | Qty: 90 | Fill #0

## 2015-05-30 MED FILL — traMADol HCL 50 MG TABS: 50 | 30 days supply | Qty: 90 | Fill #0
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
## 2015-05-30 NOTE — Patient Instructions (Signed)
Back Pain, Adult °Back pain is very common in adults. The cause of back pain is rarely dangerous and the pain often gets better over time. The cause of your back pain may not be known. Some common causes of back pain include: °· Strain of the muscles or ligaments supporting the spine. °· Wear and tear (degeneration) of the spinal disks. °· Arthritis. °· Direct injury to the back. °For many people, back pain may return. Since back pain is rarely dangerous, most people can learn to manage this condition on their own. °HOME CARE INSTRUCTIONS °Watch your back pain for any changes. The following actions may help to lessen any discomfort you are feeling: °· Remain active. It is stressful on your back to sit or stand in one place for long periods of time. Do not sit, drive, or stand in one place for more than 30 minutes at a time. Take short walks on even surfaces as soon as you are able. Try to increase the length of time you walk each day. °· Exercise regularly as directed by your health care provider. Exercise helps your back heal faster. It also helps avoid future injury by keeping your muscles strong and flexible. °· Do not stay in bed. Resting more than 1-2 days can delay your recovery. °· Pay attention to your body when you bend and lift. The most comfortable positions are those that put less stress on your recovering back. Always use proper lifting techniques, including: °· Bending your knees. °· Keeping the load close to your body. °· Avoiding twisting. °· Find a comfortable position to sleep. Use a firm mattress and lie on your side with your knees slightly bent. If you lie on your back, put a pillow under your knees. °· Avoid feeling anxious or stressed. Stress increases muscle tension and can worsen back pain. It is important to recognize when you are anxious or stressed and learn ways to manage it, such as with exercise. °· Take medicines only as directed by your health care provider. Over-the-counter  medicines to reduce pain and inflammation are often the most helpful. Your health care provider may prescribe muscle relaxant drugs. These medicines help dull your pain so you can more quickly return to your normal activities and healthy exercise. °· Apply ice to the injured area: °· Put ice in a plastic bag. °· Place a towel between your skin and the bag. °· Leave the ice on for 20 minutes, 2-3 times a day for the first 2-3 days. After that, ice and heat may be alternated to reduce pain and spasms. °· Maintain a healthy weight. Excess weight puts extra stress on your back and makes it difficult to maintain good posture. °SEEK MEDICAL CARE IF: °· You have pain that is not relieved with rest or medicine. °· You have increasing pain going down into the legs or buttocks. °· You have pain that does not improve in one week. °· You have night pain. °· You lose weight. °· You have a fever or chills. °SEEK IMMEDIATE MEDICAL CARE IF:  °· You develop new bowel or bladder control problems. °· You have unusual weakness or numbness in your arms or legs. °· You develop nausea or vomiting. °· You develop abdominal pain. °· You feel faint. °  °This information is not intended to replace advice given to you by your health care provider. Make sure you discuss any questions you have with your health care provider. °  °Document Released: 03/05/2005 Document Revised: 03/26/2014 Document Reviewed: 07/07/2013 °Elsevier Interactive Patient Education ©2016 Elsevier   Inc[employer] DASH Eating Plan DASH stands for "Dietary Approaches to Stop Hypertension." The DASH eating plan is a healthy eating plan that has been shown to reduce high blood pressure (hypertension). Additional health benefits may include reducing the risk of type 2 diabetes mellitus, heart disease, and stroke. The DASH eating plan may also help with weight loss. WHAT DO I NEED TO KNOW ABOUT THE DASH EATING PLAN? For the DASH eating plan, you will follow these general  guidelines:  Choose foods with a percent daily value for sodium of less than 5% (as listed on the food label).  Use salt-free seasonings or herbs instead of table salt or sea salt.  Check with your health care provider or pharmacist before using salt substitutes.  Eat lower-sodium products, often labeled as "lower sodium" or "no salt added."  Eat fresh foods.  Eat more vegetables, fruits, and low-fat dairy products.  Choose whole grains. Look for the word "whole" as the first word in the ingredient list.  Choose fish and skinless chicken or turkey more often than red meat. Limit fish, poultry, and meat to 6 oz (170 g) each day.  Limit sweets, desserts, sugars, and sugary drinks.  Choose heart-healthy fats.  Limit cheese to 1 oz (28 g) per day.  Eat more home-cooked food and less restaurant, buffet, and fast food.  Limit fried foods.  Cook foods using methods other than frying.  Limit canned vegetables. If you do use them, rinse them well to decrease the sodium.  When eating at a restaurant, ask that your food be prepared with less salt, or no salt if possible. WHAT FOODS CAN I EAT? Seek help from a dietitian for individual calorie needs. Grains Whole grain or whole wheat bread. Brown rice. Whole grain or whole wheat pasta. Quinoa, bulgur, and whole grain cereals. Low-sodium cereals. Corn or whole wheat flour tortillas. Whole grain cornbread. Whole grain crackers. Low-sodium crackers. Vegetables Fresh or frozen vegetables (raw, steamed, roasted, or grilled). Low-sodium or reduced-sodium tomato and vegetable juices. Low-sodium or reduced-sodium tomato sauce and paste. Low-sodium or reduced-sodium canned vegetables.  Fruits All fresh, canned (in natural juice), or frozen fruits. Meat and Other Protein Products Ground beef (85% or leaner), grass-fed beef, or beef trimmed of fat. Skinless chicken or turkey. Ground chicken or turkey. Pork trimmed of fat. All fish and seafood.  Eggs. Dried beans, peas, or lentils. Unsalted nuts and seeds. Unsalted canned beans. Dairy Low-fat dairy products, such as skim or 1% milk, 2% or reduced-fat cheeses, low-fat ricotta or cottage cheese, or plain low-fat yogurt. Low-sodium or reduced-sodium cheeses. Fats and Oils Tub margarines without trans fats. Light or reduced-fat mayonnaise and salad dressings (reduced sodium). Avocado. Safflower, olive, or canola oils. Natural peanut or almond butter. Other Unsalted popcorn and pretzels. The items listed above may not be a complete list of recommended foods or beverages. Contact your dietitian for more options. WHAT FOODS ARE NOT RECOMMENDED? Grains White bread. White pasta. White rice. Refined cornbread. Bagels and croissants. Crackers that contain trans fat. Vegetables Creamed or fried vegetables. Vegetables in a cheese sauce. Regular canned vegetables. Regular canned tomato sauce and paste. Regular tomato and vegetable juices. Fruits Dried fruits. Canned fruit in light or heavy syrup. Fruit juice. Meat and Other Protein Products Fatty cuts of meat. Ribs, chicken wings, bacon, sausage, bologna, salami, chitterlings, fatback, hot dogs, bratwurst, and packaged luncheon meats. Salted nuts and seeds. Canned beans with salt. Dairy Whole or 2% milk, cream, half-and-half, and cream cheese. Whole-fat or sweetened   yogurt. Full-fat cheeses or blue cheese. Nondairy creamers and whipped toppings. Processed cheese, cheese spreads, or cheese curds. Condiments Onion and garlic salt, seasoned salt, table salt, and sea salt. Canned and packaged gravies. Worcestershire sauce. Tartar sauce. Barbecue sauce. Teriyaki sauce. Soy sauce, including reduced sodium. Steak sauce. Fish sauce. Oyster sauce. Cocktail sauce. Horseradish. Ketchup and mustard. Meat flavorings and tenderizers. Bouillon cubes. Hot sauce. Tabasco sauce. Marinades. Taco seasonings. Relishes. Fats and Oils Butter, stick margarine, lard,  shortening, ghee, and bacon fat. Coconut, palm kernel, or palm oils. Regular salad dressings. Other Pickles and olives. Salted popcorn and pretzels. The items listed above may not be a complete list of foods and beverages to avoid. Contact your dietitian for more information. WHERE CAN I FIND MORE INFORMATION? National Heart, Lung, and [employer] Institute: www.nhlbi.nih.gov/health/health-topics/topics/dash/   This information is not intended to replace advice given to you by your health care provider. Make sure you discuss any questions you have with your health care provider.   Document Released: 02/22/2011 Document Revised: 03/26/2014 Document Reviewed: 01/07/2013 Elsevier Interactive Patient Education 2016 Elsevier Inc. Hypertension Hypertension, commonly called high blood pressure, is when the force of blood pumping through your arteries is too strong. Your arteries are the blood vessels that carry blood from your heart throughout your body. A blood pressure reading consists of a higher number over a lower number, such as 110/72. The higher number (systolic) is the pressure inside your arteries when your heart pumps. The lower number (diastolic) is the pressure inside your arteries when your heart relaxes. Ideally you want your blood pressure below 120/80. Hypertension forces your heart to work harder to pump blood. Your arteries may become narrow or stiff. Having untreated or uncontrolled hypertension can cause heart attack, stroke, kidney disease, and other problems. RISK FACTORS Some risk factors for high blood pressure are controllable. Others are not.  Risk factors you cannot control include:   Race. You may be at higher risk if you are African American.  Age. Risk increases with age.  Gender. Men are at higher risk than women before age 45 years. After age 65, women are at higher risk than men. Risk factors you can control include:  Not getting enough exercise or physical  activity.  Being overweight.  Getting too much fat, sugar, calories, or salt in your diet.  Drinking too much alcohol. SIGNS AND SYMPTOMS Hypertension does not usually cause signs or symptoms. Extremely high blood pressure (hypertensive crisis) may cause headache, anxiety, shortness of breath, and nosebleed. DIAGNOSIS To check if you have hypertension, your health care provider will measure your blood pressure while you are seated, with your arm held at the level of your heart. It should be measured at least twice using the same arm. Certain conditions can cause a difference in blood pressure between your right and left arms. A blood pressure reading that is higher than normal on one occasion does not mean that you need treatment. If it is not clear whether you have high blood pressure, you may be asked to return on a different day to have your blood pressure checked again. Or, you may be asked to monitor your blood pressure at home for 1 or more weeks. TREATMENT Treating high blood pressure includes making lifestyle changes and possibly taking medicine. Living a healthy lifestyle can help lower high blood pressure. You may need to change some of your habits. Lifestyle changes may include:  Following the DASH diet. This diet is high in fruits, vegetables, and   whole grains. It is low in salt, red meat, and added sugars.  Keep your sodium intake below 2,300 mg per day.  Getting at least 30-45 minutes of aerobic exercise at least 4 times per week.  Losing weight if necessary.  Not smoking.  Limiting alcoholic beverages.  Learning ways to reduce stress. Your health care provider may prescribe medicine if lifestyle changes are not enough to get your blood pressure under control, and if one of the following is true:  You are 18-59 years of age and your systolic blood pressure is above 140.  You are 60 years of age or older, and your systolic blood pressure is above 150.  Your diastolic  blood pressure is above 90.  You have diabetes, and your systolic blood pressure is over 140 or your diastolic blood pressure is over 90.  You have kidney disease and your blood pressure is above 140/90.  You have heart disease and your blood pressure is above 140/90. Your personal target blood pressure may vary depending on your medical conditions, your age, and other factors. HOME CARE INSTRUCTIONS  Have your blood pressure rechecked as directed by your health care provider.   Take medicines only as directed by your health care provider. Follow the directions carefully. Blood pressure medicines must be taken as prescribed. The medicine does not work as well when you skip doses. Skipping doses also puts you at risk for problems.  Do not smoke.   Monitor your blood pressure at home as directed by your health care provider. SEEK MEDICAL CARE IF:   You think you are having a reaction to medicines taken.  You have recurrent headaches or feel dizzy.  You have swelling in your ankles.  You have trouble with your vision. SEEK IMMEDIATE MEDICAL CARE IF:  You develop a severe headache or confusion.  You have unusual weakness, numbness, or feel faint.  You have severe chest or abdominal pain.  You vomit repeatedly.  You have trouble breathing. MAKE SURE YOU:   Understand these instructions.  Will watch your condition.  Will get help right away if you are not doing well or get worse.   This information is not intended to replace advice given to you by your health care provider. Make sure you discuss any questions you have with your health care provider.   Document Released: 03/05/2005 Document Revised: 07/20/2014 Document Reviewed: 12/26/2012 Elsevier Interactive Patient Education 2016 Elsevier Inc.  
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
## 2015-05-31 MED FILL — ATENOLOL/CHLORTHAL 50/25: 50-25 | 30 days supply | Qty: 30 | Fill #5

## 2015-06-02 LAB — TOXASSURE SELECT,+ANTIDEPR,UR: PDF: 0

## 2015-06-03 NOTE — Progress Notes (Signed)
Urine drug screen for this encounter is consistent for no prescribed medication. 

## 2015-06-09 ENCOUNTER — Ambulatory Visit (HOSPITAL_BASED_OUTPATIENT_CLINIC_OR_DEPARTMENT_OTHER): Payer: Self-pay | Admitting: Physical Medicine & Rehabilitation

## 2015-06-09 ENCOUNTER — Encounter: Payer: Self-pay | Admitting: Physical Medicine & Rehabilitation

## 2015-06-09 VITALS — BP 134/77 | HR 66 | Resp 14

## 2015-06-09 DIAGNOSIS — Z8781 Personal history of (healed) traumatic fracture: Secondary | ICD-10-CM

## 2015-06-09 DIAGNOSIS — M546 Pain in thoracic spine: Secondary | ICD-10-CM

## 2015-06-09 MED ORDER — ACETAMINOPHEN-CODEINE #4 300-60 MG PO TABS: 1.0000 | ORAL_TABLET | Freq: Four times a day (QID) | ORAL | Status: DC | PRN

## 2015-06-09 NOTE — Patient Instructions (Addendum)
Your x-ray showed the old thoracic fractures. Nothing has changed with them but they still may be a source of pain. We'll try a little stronger medicine this month and if still not effective we can try a stronger medicine next month.
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
## 2015-06-29 MED FILL — ATENOLOL/CHLORTHAL 50/25: 50-25 | 30 days supply | Qty: 30 | Fill #6

## 2015-06-29 MED FILL — PRAVASTATIN NA 20 MG TAB: 20 | 30 days supply | Qty: 15 | Fill #2

## 2015-07-08 ENCOUNTER — Encounter: Payer: Self-pay | Attending: Physical Medicine & Rehabilitation

## 2015-07-08 ENCOUNTER — Encounter: Payer: Self-pay | Admitting: Physical Medicine & Rehabilitation

## 2015-07-08 ENCOUNTER — Ambulatory Visit (HOSPITAL_BASED_OUTPATIENT_CLINIC_OR_DEPARTMENT_OTHER): Payer: Self-pay | Admitting: Physical Medicine & Rehabilitation

## 2015-07-08 VITALS — BP 123/78 | HR 74

## 2015-07-08 DIAGNOSIS — G8929 Other chronic pain: Secondary | ICD-10-CM | POA: Insufficient documentation

## 2015-07-08 DIAGNOSIS — M546 Pain in thoracic spine: Secondary | ICD-10-CM | POA: Insufficient documentation

## 2015-07-08 DIAGNOSIS — M545 Low back pain: Secondary | ICD-10-CM

## 2015-07-08 MED ORDER — OXYCODONE HCL 5 MG PO TABS: 5.0000 mg | ORAL_TABLET | Freq: Four times a day (QID) | ORAL | Status: DC | PRN
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
## 2015-07-08 NOTE — Patient Instructions (Signed)
We'll see  in 1 month

## 2015-08-01 MED FILL — PRAVASTATIN NA 20 MG TAB: 20 | 30 days supply | Qty: 15 | Fill #3

## 2015-08-01 MED FILL — ATENOLOL/CHLORTHAL 50/25: 50-25 | 30 days supply | Qty: 30 | Fill #7

## 2015-08-05 ENCOUNTER — Encounter: Payer: Self-pay | Admitting: Physical Medicine & Rehabilitation

## 2015-08-05 ENCOUNTER — Encounter: Payer: Self-pay | Attending: Physical Medicine & Rehabilitation

## 2015-08-05 ENCOUNTER — Ambulatory Visit (HOSPITAL_BASED_OUTPATIENT_CLINIC_OR_DEPARTMENT_OTHER): Payer: Self-pay | Admitting: Physical Medicine & Rehabilitation

## 2015-08-05 VITALS — BP 154/98 | HR 97 | Resp 14

## 2015-08-05 DIAGNOSIS — Z5181 Encounter for therapeutic drug level monitoring: Secondary | ICD-10-CM

## 2015-08-05 DIAGNOSIS — M546 Pain in thoracic spine: Secondary | ICD-10-CM | POA: Insufficient documentation

## 2015-08-05 DIAGNOSIS — Z79899 Other long term (current) drug therapy: Secondary | ICD-10-CM

## 2015-08-05 DIAGNOSIS — G8929 Other chronic pain: Secondary | ICD-10-CM | POA: Insufficient documentation

## 2015-08-05 DIAGNOSIS — G894 Chronic pain syndrome: Secondary | ICD-10-CM

## 2015-08-05 DIAGNOSIS — M545 Low back pain: Secondary | ICD-10-CM | POA: Insufficient documentation

## 2015-08-05 MED ORDER — OXYCODONE-ACETAMINOPHEN 7.5-325 MG PO TABS: 1.0000 | ORAL_TABLET | Freq: Three times a day (TID) | ORAL | Status: DC | PRN
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
## 2015-08-05 NOTE — Patient Instructions (Signed)
Do not exceed 3 tablets per day You'll see my assistant every month. You can see me in 6 months.  My assistant will not be able to change dose.

## 2015-08-13 LAB — 6-ACETYLMORPHINE,TOXASSURE ADD
6-ACETYLMORPHINE: NEGATIVE
6-acetylmorphine: NOT DETECTED ng/mg creat

## 2015-08-13 LAB — TOXASSURE SELECT,+ANTIDEPR,UR

## 2015-08-18 ENCOUNTER — Telehealth: Payer: Self-pay | Admitting: *Deleted

## 2015-08-18 NOTE — Progress Notes (Signed)
Urine drug screen for this encounter is consistent for prescribed medication 

## 2015-08-18 NOTE — Telephone Encounter (Signed)
Contacted pt per clinic manager Anner Crete to discuss UDS results nd current medication use. Patient was taking tylenol #4 and oxycodone.  He reports that he is no longer taking those and has been consolidated to tylenol-acetaminophen 7.5-325 mg. His last UDS indicated Tylenol #4 which was not reported and it did not indicate oxycodone which he did report.

## 2015-08-23 NOTE — Telephone Encounter (Signed)
AK would like to discharge according to a note he wrote on the UDS lab. Can you assist with letter?

## 2015-08-31 NOTE — Telephone Encounter (Signed)
Please bring me the UDS results with note.

## 2015-09-01 NOTE — Telephone Encounter (Signed)
Pt's UDS result and note is in chart under "labs" dated 08/05/15. Thanks!

## 2015-09-02 ENCOUNTER — Encounter: Payer: Self-pay | Attending: Physical Medicine & Rehabilitation | Admitting: Registered Nurse

## 2015-09-02 ENCOUNTER — Encounter: Payer: Self-pay | Admitting: Registered Nurse

## 2015-09-02 VITALS — BP 144/94 | HR 74 | Resp 16

## 2015-09-02 DIAGNOSIS — M546 Pain in thoracic spine: Secondary | ICD-10-CM | POA: Insufficient documentation

## 2015-09-02 DIAGNOSIS — M545 Low back pain: Secondary | ICD-10-CM | POA: Insufficient documentation

## 2015-09-02 DIAGNOSIS — G894 Chronic pain syndrome: Secondary | ICD-10-CM

## 2015-09-02 DIAGNOSIS — Z5181 Encounter for therapeutic drug level monitoring: Secondary | ICD-10-CM

## 2015-09-02 DIAGNOSIS — G8929 Other chronic pain: Secondary | ICD-10-CM | POA: Insufficient documentation

## 2015-09-02 DIAGNOSIS — Z79899 Other long term (current) drug therapy: Secondary | ICD-10-CM

## 2015-09-02 MED ORDER — OXYCODONE-ACETAMINOPHEN 7.5-325 MG PO TABS: 1.0000 | ORAL_TABLET | Freq: Three times a day (TID) | ORAL | Status: DC | PRN

## 2015-09-02 NOTE — Progress Notes (Signed)
Subjective:    Patient ID: Corey Hale, male    DOB: 1965/03/22, 50 y.o.   MRN: [id number]  HPI: Corey Hale is a 50 year old male who returns for chronic pain and medication refill. He states his pain is located in his mid-back. He rates his pain 7. His current exercise regime is walking. Corey Hale, was looking to increase his oxycodone dose, explain to Corey Hale dose will remain the same. He was encouraged to use heat and alternate with ice. Also encouraged to use HEP he verbalizes understanding. Oxycodone dose will remain the same we reviewed the narcotic policy and only take medication as prescribed. Also violation can lead to discharge from office.  He verbalizes understanding.  Arrived hypertensive, blood pressure re-checked 144/94.   Pain Inventory Average Pain 7 Pain Right Now 7 My pain is sharp  In the last 24 hours, has pain interfered with the following? General activity 8 Relation with others 8 Enjoyment of life 8 What TIME of day is your pain at its worst? daytime Sleep (in general) Good  Pain is worse with: bending, sitting and standing Pain improves with: Na Relief from Meds: NA  Mobility walk without assistance do you drive?  yes  Function employed # of hrs/week 35  Neuro/Psych No problems in this area  Prior Studies Any changes since last visit?  no  Physicians involved in your care Any changes since last visit?  no   Family History  Problem Relation Age of Onset  . Stroke Mother   . Stroke Father    Social History   Social History  . Marital Status: Married    Spouse Name: N/A  . Number of Children: N/A  . Years of Education: N/A   Social History Main Topics  . Smoking status: Former Smoker -- 0.25 packs/day    Quit date: 03/23/2015  . Smokeless tobacco: Never Used  . Alcohol Use: No  . Drug Use: No  . Sexual Activity: Not Asked   Other Topics Concern  . None   Social History Narrative   ** Merged History Encounter **        Past Surgical History  Procedure Laterality Date  . Neck surgery    . Arm hardware removal Right    Past Medical History  Diagnosis Date  . Hypertension   . Hypercholesteremia   . Thyroid disease   . Chronic back pain    BP 148/108 mmHg  Pulse 74  Resp 16  SpO2 98%  Opioid Risk Score:   Fall Risk Score:  `1  Depression screen PHQ 2/9  Depression screen [facility] 2/9 09/02/2015 07/08/2015 05/26/2015 03/07/2015 11/26/2014 10/07/2014 08/19/2014  Decreased Interest 0 0 0 0 0 0 0  Down, Depressed, Hopeless 0 0 0 0 0 0 0  PHQ - 2 Score 0 0 0 0 0 0 0  Altered sleeping - - 0 - - - -  Tired, decreased energy - - 0 - - - -  Change in appetite - - 0 - - - -  Feeling bad or failure about yourself  - - 0 - - - -  Trouble concentrating - - 0 - - - -  Moving slowly or fidgety/restless - - 0 - - - -  Suicidal thoughts - - 0 - - - -  PHQ-9 Score - - 0 - - - -  Difficult doing work/chores - - Not difficult at all - - - -  Review of Systems  All other systems reviewed and are negative.      Objective:   Physical Exam  Constitutional: He is oriented to person, place, and time. He appears well-developed and well-nourished.  HENT:  Head: Normocephalic and atraumatic.  Neck: Normal range of motion. Neck supple.  Cardiovascular: Normal rate and regular rhythm.   Pulmonary/Chest: Effort normal and breath sounds normal.  Musculoskeletal:  Normal Muscle Bulk and Muscle Testing Reveals: Upper Extremities: Full ROM and Muscle Strength 5/5 Thoracic Paraspinal Tenderness: T-7- T-9 Lower Extremities: Full ROM and Muscle Strength 5/5 Arises from chair with ease Narrow Based Gait  Neurological: He is alert and oriented to person, place, and time.  Skin: Skin is warm and dry.  Psychiatric: He has a normal mood and affect.  Nursing note and vitals reviewed.         Assessment & Plan:  1.History of thoracic compression fractures T5 T7 T11, most symptomatic in the T7 and T9.  Refilled:  Oxycodone 5 mg one tablet every 6 hours as needed for severe pain. We will continue the opioid monitoring program, this consists of regular clinic visits, examinations, urine drug screen, pill counts as well as use of [place] Controlled Substance reporting System.  F/U in 1 month

## 2015-09-09 ENCOUNTER — Encounter: Payer: Self-pay | Admitting: Physical Medicine & Rehabilitation

## 2015-09-12 MED FILL — PRAVASTATIN NA 20 MG TAB: 20 | 30 days supply | Qty: 15 | Fill #4

## 2015-09-12 MED FILL — ATENOLOL/CHLORTHAL 50/25: 50-25 | 30 days supply | Qty: 30 | Fill #8

## 2015-09-28 ENCOUNTER — Ambulatory Visit: Payer: Self-pay | Admitting: Registered Nurse

## 2015-10-12 MED FILL — CYCLOBENZAPRINE 5 MG TABLET: 5 | 30 days supply | Qty: 90 | Fill #1

## 2015-10-12 MED FILL — PRAVASTATIN NA 20 MG TAB: 20 | 30 days supply | Qty: 15 | Fill #5

## 2015-10-12 MED FILL — ATENOLOL/CHLORTHAL 50/25: 50-25 | 30 days supply | Qty: 30 | Fill #9

## 2015-10-20 ENCOUNTER — Encounter: Payer: Self-pay | Admitting: Internal Medicine

## 2015-10-20 ENCOUNTER — Ambulatory Visit: Payer: Self-pay | Attending: Internal Medicine | Admitting: Internal Medicine

## 2015-10-20 VITALS — BP 127/87 | HR 69 | Temp 98.7°F | Resp 18 | Ht 73.0 in | Wt 219.0 lb

## 2015-10-20 DIAGNOSIS — I10 Essential (primary) hypertension: Secondary | ICD-10-CM | POA: Insufficient documentation

## 2015-10-20 DIAGNOSIS — G8929 Other chronic pain: Secondary | ICD-10-CM | POA: Insufficient documentation

## 2015-10-20 DIAGNOSIS — M549 Dorsalgia, unspecified: Secondary | ICD-10-CM

## 2015-10-20 DIAGNOSIS — B009 Herpesviral infection, unspecified: Secondary | ICD-10-CM | POA: Insufficient documentation

## 2015-10-20 DIAGNOSIS — M545 Low back pain: Secondary | ICD-10-CM | POA: Insufficient documentation

## 2015-10-20 DIAGNOSIS — Z20828 Contact with and (suspected) exposure to other viral communicable diseases: Secondary | ICD-10-CM

## 2015-10-20 DIAGNOSIS — E785 Hyperlipidemia, unspecified: Secondary | ICD-10-CM | POA: Insufficient documentation

## 2015-10-20 MED ORDER — PRAVASTATIN SODIUM 20 MG PO TABS: 10.0000 mg | ORAL_TABLET | Freq: Every day | ORAL | 3 refills | Status: DC

## 2015-10-20 MED ORDER — VALACYCLOVIR HCL 1 G PO TABS: 1000.0000 mg | ORAL_TABLET | Freq: Every day | ORAL | 12 refills | Status: DC

## 2015-10-20 MED ORDER — ATENOLOL-CHLORTHALIDONE 50-25 MG PO TABS: 1.0000 | ORAL_TABLET | Freq: Every day | ORAL | 3 refills | Status: DC

## 2015-10-20 MED ORDER — TRAMADOL HCL 50 MG PO TABS: 50.0000 mg | ORAL_TABLET | Freq: Three times a day (TID) | ORAL | 0 refills | Status: DC | PRN

## 2015-10-20 MED FILL — traMADol HCL 50 MG TABS: 50 | 20 days supply | Qty: 60 | Fill #0

## 2015-10-20 NOTE — Progress Notes (Signed)
Corey Hale, is a 50 y.o. male  [id number]  [id number]  DOB - May 06, 1965  Chief Complaint  Patient presents with  . Medication Refill        Subjective:   Corey Hale is a 50 y.o. male with history of chronic low back pain, hypertension, hyperlipidemia and chronic herpes infection here today for a follow up visit and for medication refills. Patient was recently discharged from the pain clinic because of violation of contract, currently does not have pain medications. Patient was prescribed oxycodone from the pain clinic, but his urine drug screen did not show any evidence of oxycodone. He is requesting referral to orthopedic clinic and also requested pain medication prescription today. Patient has no significant complaint except for ongoing low back pain. His blood pressure is controlled. Patient has No headache, No chest pain, No abdominal pain - No Nausea, No new weakness tingling or numbness, No Cough - SOB. Patient continues to smoke cigarettes about half a pack per day. He is not ready to quit despite repeated counseling.  No problems updated.  ALLERGIES: No Known Allergies  PAST MEDICAL HISTORY: Past Medical History:  Diagnosis Date  . Chronic back pain   . Hypercholesteremia   . Hypertension   . Thyroid disease     MEDICATIONS AT HOME: Prior to Admission medications   Medication Sig Start Date End Date Taking? Authorizing Provider  aspirin 81 MG tablet Take 1 tablet (81 mg total) by mouth daily. 03/07/15  Yes Tresa Garter, MD  atenolol-chlorthalidone (TENORETIC) 50-25 MG tablet Take 1 tablet by mouth daily. 10/20/15  Yes Tresa Garter, MD  cyclobenzaprine (FLEXERIL) 5 MG tablet Take 1 tablet (5 mg total) by mouth 3 (three) times daily as needed for muscle spasms (bACK pAIN). 05/30/15  Yes Tresa Garter, MD  pravastatin (PRAVACHOL) 20 MG tablet Take 0.5 tablets (10 mg total) by mouth daily. 10/20/15  Yes Tresa Garter, MD  traMADol (ULTRAM) 50  MG tablet Take 1 tablet (50 mg total) by mouth every 8 (eight) hours as needed. 10/20/15   Tresa Garter, MD  valACYclovir (VALTREX) 1000 MG tablet Take 1 tablet (1,000 mg total) by mouth daily. 10/20/15   Tresa Garter, MD     Objective:   Vitals:   10/20/15 1535  BP: 127/87  Pulse: 69  Resp: 18  Temp: 98.7 F (37.1 C)  TempSrc: Oral  SpO2: 99%  Weight: 219 lb (99.3 kg)  Height: 6\' 1"  (1.854 m)    Exam General appearance : Awake, alert, not in any distress. Speech Clear. Not toxic looking HEENT: Atraumatic and Normocephalic, pupils equally reactive to light and accomodation Neck: Supple, no JVD. No cervical lymphadenopathy.  Chest: Good air entry bilaterally, no added sounds  CVS: S1 S2 regular, no murmurs.  Abdomen: Bowel sounds present, Non tender and not distended with no gaurding, rigidity or rebound. Extremities: B/L Lower Ext shows no edema, both legs are warm to touch Neurology: Awake alert, and oriented X 3, CN II-XII intact, Non focal Skin: No Rash  Data Review Lab Results  Component Value Date   HGBA1C 5.7 (H) 06/10/2012     Assessment & Plan   1. Essential hypertension  - atenolol-chlorthalidone (TENORETIC) 50-25 MG tablet; Take 1 tablet by mouth daily.  Dispense: 90 tablet; Refill: 3  We have discussed target BP range and blood pressure goal. I have advised patient to check BP regularly and to call us back or report to clinic if the  numbers are consistently higher than 140/90. We discussed the importance of compliance with medical therapy and DASH diet recommended, consequences of uncontrolled hypertension discussed.  - continue current BP medications  2. Dyslipidemia  - pravastatin (PRAVACHOL) 20 MG tablet; Take 0.5 tablets (10 mg total) by mouth daily.  Dispense: 90 tablet; Refill: 3  To address this please limit saturated fat to no more than 7% of your calories, limit cholesterol to 200 mg/day, increase fiber and exercise as tolerated. If  needed we may add another cholesterol lowering medication to your regimen.   3. Chronic back pain  - traMADol (ULTRAM) 50 MG tablet; Take 1 tablet (50 mg total) by mouth every 8 (eight) hours as needed.  Dispense: 60 tablet; Refill: 0 - Ambulatory referral to Pain Clinic  4. Herpes exposure  - valACYclovir (VALTREX) 1000 MG tablet; Take 1 tablet (1,000 mg total) by mouth daily.  Dispense: 30 tablet; Refill: 12  Patient have been counseled extensively about nutrition and exercise  Return in about 6 months (around 04/21/2016) for Follow up Pain and comorbidities, Follow up HTN.  The patient was given clear instructions to go to ER or return to medical center if symptoms don't improve, worsen or new problems develop. The patient verbalized understanding. The patient was told to call to get lab results if they haven't heard anything in the next week.   This note has been created with Surveyor, quantity. Any transcriptional errors are unintentional.    Angelica Chessman, MD, [place], [place], [place], [place] [facility] [place], [place]   10/20/2015, 4:19 PM

## 2015-10-20 NOTE — Progress Notes (Signed)
Patient is here for Med Refill  Patient denies pain at this time.  Patient has eaten today and patient has taken medication today.  Patient request Tramadol refill, Valtrex refill and Atenolol-Chl refill.

## 2015-10-20 NOTE — Patient Instructions (Signed)
Dyslipidemia Dyslipidemia is an imbalance of the lipids in your blood. Lipids are waxy, fat-like proteins that your body needs in small amounts. Dyslipidemia often involves the lipids cholesterol or triglycerides. Common forms of dyslipidemia are:  High levels of bad cholesterol (LDL cholesterol). LDL cholesterol is the type of cholesterol that causes heart disease.  Low levels of good cholesterol (HDL cholesterol). HDL cholesterol is the type of cholesterol that helps protect against heart disease.  High levels of triglycerides. Triglycerides are a fatty substance in the blood linked to a buildup of plaque on your arteries. RISK FACTORS  Increased age.  Having a family history of high cholesterol.  Certain medicines, including birth control pills, diuretics, beta-blockers, and some medicines for depression.  Smoking.  Eating a high-fat diet.  Being overweight.  Medical conditions such as diabetes, polycystic ovary syndrome, pregnancy, kidney disease, and hypothyroidism.  Lack of regular exercise. SIGNS AND SYMPTOMS There are no signs or symptoms with dyslipidemia. DIAGNOSIS A simple blood test called a fasting blood test can be done to determine your level of:  Total cholesterol. This is the combined number of LDL cholesterol and HDL cholesterol. A healthy number is lower than 200.  LDL cholesterol. The goal number for LDL cholesterol is different for each person depending on risk factors. Ask your health care provider what your LDL cholesterol number should be.  HDL cholesterol. A healthy level of HDL cholesterol is 60 or higher. A number lower than 40 for men or 50 for women is a danger sign.  Triglycerides. A healthy triglyceride number is less than 150. TREATMENT Dyslipidemia is a treatable condition. Your health care provider will advise you on what type of treatment is best based on your age, your test results, and current guidelines. Treatment may include:  Dietary  changes. A dietitian may help you create a diet that is based on your risk factors, conditions, and lifestyle.  Regular exercise. This can help lower your LDL cholesterol, raise your HDL cholesterol, and help with weight management. Check with your health care provider before beginning an exercise program. Most people should participate in 30 minutes of brisk exercise 5 days a week.  Quitting smoking.  Medicines to lower LDL cholesterol and triglycerides.  If you have high levels of triglycerides, your health care provider may:  Have you stop drinking alcohol.  Have you restrict your fat intake.  Have you eliminate refined sugars from your diet.  Treat you for other conditions, such as underactive thyroid gland (hypothyroidism) and high blood sugar (hyperglycemia). Your health care provider will monitor your lipid levels with regular blood tests. HOME CARE INSTRUCTIONS  Eat a healthy diet. Follow any diet instructions if they were given to you by your health care provider.  Maintain a healthy weight.  Exercise regularly based on the recommendations of your health care provider.  Do not use any tobacco products, including cigarettes, chewing tobacco, or electronic cigarettes.  Take medicines only as directed by your health care provider.  Keep all follow-up visits as directed by your health care provider. SEEK MEDICAL CARE IF: You are having possible side effects from your medicines.   This information is not intended to replace advice given to you by your health care provider. Make sure you discuss any questions you have with your health care provider.   Document Released: 03/10/2013 Document Revised: 03/26/2014 Document Reviewed: 03/10/2013 Elsevier Interactive Patient Education 2016 [employer]. Hypertension Hypertension, commonly called high blood pressure, is when the force of blood pumping  through your arteries is too strong. Your arteries are the blood vessels that  carry blood from your heart throughout your body. A blood pressure reading consists of a higher number over a lower number, such as 110/72. The higher number (systolic) is the pressure inside your arteries when your heart pumps. The lower number (diastolic) is the pressure inside your arteries when your heart relaxes. Ideally you want your blood pressure below 120/80. Hypertension forces your heart to work harder to pump blood. Your arteries may become narrow or stiff. Having untreated or uncontrolled hypertension can cause heart attack, stroke, kidney disease, and other problems. RISK FACTORS Some risk factors for high blood pressure are controllable. Others are not.  Risk factors you cannot control include:   Race. You may be at higher risk if you are African American.  Age. Risk increases with age.  Gender. Men are at higher risk than women before age 52 years. After age 50, women are at higher risk than men. Risk factors you can control include:  Not getting enough exercise or physical activity.  Being overweight.  Getting too much fat, sugar, calories, or salt in your diet.  Drinking too much alcohol. SIGNS AND SYMPTOMS Hypertension does not usually cause signs or symptoms. Extremely high blood pressure (hypertensive crisis) may cause headache, anxiety, shortness of breath, and nosebleed. DIAGNOSIS To check if you have hypertension, your health care provider will measure your blood pressure while you are seated, with your arm held at the level of your heart. It should be measured at least twice using the same arm. Certain conditions can cause a difference in blood pressure between your right and left arms. A blood pressure reading that is higher than normal on one occasion does not mean that you need treatment. If it is not clear whether you have high blood pressure, you may be asked to return on a different day to have your blood pressure checked again. Or, you may be asked to monitor  your blood pressure at home for 1 or more weeks. TREATMENT Treating high blood pressure includes making lifestyle changes and possibly taking medicine. Living a healthy lifestyle can help lower high blood pressure. You may need to change some of your habits. Lifestyle changes may include:  Following the DASH diet. This diet is high in fruits, vegetables, and whole grains. It is low in salt, red meat, and added sugars.  Keep your sodium intake below 2,300 mg per day.  Getting at least 30-45 minutes of aerobic exercise at least 4 times per week.  Losing weight if necessary.  Not smoking.  Limiting alcoholic beverages.  Learning ways to reduce stress. Your health care provider may prescribe medicine if lifestyle changes are not enough to get your blood pressure under control, and if one of the following is true:  You are 53-25 years of age and your systolic blood pressure is above 140.  You are 59 years of age or older, and your systolic blood pressure is above 150.  Your diastolic blood pressure is above 90.  You have diabetes, and your systolic blood pressure is over [id number] or your diastolic blood pressure is over 90.  You have kidney disease and your blood pressure is above 140/90.  You have heart disease and your blood pressure is above 140/90. Your personal target blood pressure may vary depending on your medical conditions, your age, and other factors. HOME CARE INSTRUCTIONS  Have your blood pressure rechecked as directed by your health care provider.  Take medicines only as directed by your health care provider. Follow the directions carefully. Blood pressure medicines must be taken as prescribed. The medicine does not work as well when you skip doses. Skipping doses also puts you at risk for problems.  Do not smoke.   Monitor your blood pressure at home as directed by your health care provider. SEEK MEDICAL CARE IF:   You think you are having a reaction to medicines  taken.  You have recurrent headaches or feel dizzy.  You have swelling in your ankles.  You have trouble with your vision. SEEK IMMEDIATE MEDICAL CARE IF:  You develop a severe headache or confusion.  You have unusual weakness, numbness, or feel faint.  You have severe chest or abdominal pain.  You vomit repeatedly.  You have trouble breathing. MAKE SURE YOU:   Understand these instructions.  Will watch your condition.  Will get help right away if you are not doing well or get worse.   This information is not intended to replace advice given to you by your health care provider. Make sure you discuss any questions you have with your health care provider.   Document Released: 03/05/2005 Document Revised: 07/20/2014 Document Reviewed: 12/26/2012 Elsevier Interactive Patient Education [employer].

## 2015-10-25 MED FILL — ?VALACYCLOVIR HCL 1 GRAM TA: 1 | 30 days supply | Qty: 30 | Fill #0

## 2015-11-09 MED FILL — PRAVASTATIN NA 20 MG TAB: 20 | 30 days supply | Qty: 15 | Fill #6

## 2015-11-09 MED FILL — ATENOLOL/CHLORTHAL 50/25: 50-25 | 30 days supply | Qty: 30 | Fill #10

## 2015-12-08 MED FILL — ?VALACYCLOVIR HCL 1 GRAM TA: 1 | 30 days supply | Qty: 30 | Fill #1

## 2015-12-08 MED FILL — PRAVASTATIN NA 20 MG TAB: 20 | 30 days supply | Qty: 15 | Fill #7

## 2015-12-08 MED FILL — ATENOLOL/CHLORTHAL 50/25: 50-25 | 30 days supply | Qty: 30 | Fill #0

## 2015-12-12 ENCOUNTER — Other Ambulatory Visit: Payer: Self-pay | Admitting: Internal Medicine

## 2015-12-12 DIAGNOSIS — G8929 Other chronic pain: Secondary | ICD-10-CM

## 2015-12-12 DIAGNOSIS — M549 Dorsalgia, unspecified: Principal | ICD-10-CM

## 2015-12-12 MED ORDER — TRAMADOL HCL 50 MG PO TABS: 50.0000 mg | ORAL_TABLET | Freq: Three times a day (TID) | ORAL | 0 refills | Status: DC | PRN

## 2015-12-12 MED FILL — traMADol HCL 50 MG TABS: 50 | 20 days supply | Qty: 60 | Fill #0

## 2015-12-29 ENCOUNTER — Telehealth: Payer: Self-pay | Admitting: Internal Medicine

## 2015-12-29 NOTE — Telephone Encounter (Signed)
Thank you always Alinda Sierras!

## 2015-12-29 NOTE — Telephone Encounter (Signed)
I spoke to patient and I will   Sent Referral to [facility] Ph. # [phone] .They will contact the patient to schedule an appointment.

## 2015-12-29 NOTE — Telephone Encounter (Signed)
Patient needs to be referred to a different pain management.

## 2016-01-09 NOTE — Telephone Encounter (Signed)
Pt. Called requesting a refill on Tramadol. Please f/u °

## 2016-01-10 MED FILL — ATENOLOL/CHLORTHAL 50/25: 50-25 | 30 days supply | Qty: 30 | Fill #1

## 2016-01-10 MED FILL — PRAVASTATIN NA 20 MG TAB: 20 | 30 days supply | Qty: 15 | Fill #8

## 2016-01-12 ENCOUNTER — Other Ambulatory Visit: Payer: Self-pay | Admitting: Internal Medicine

## 2016-01-12 DIAGNOSIS — M545 Low back pain: Principal | ICD-10-CM

## 2016-01-12 DIAGNOSIS — G8929 Other chronic pain: Secondary | ICD-10-CM

## 2016-01-12 MED ORDER — TRAMADOL HCL 50 MG PO TABS
50.0000 mg | ORAL_TABLET | Freq: Three times a day (TID) | ORAL | 0 refills | Status: DC | PRN
Start: 2016-01-12 — End: 2016-02-13

## 2016-01-13 MED FILL — traMADol HCL 50 MG TABS: 50 | 20 days supply | Qty: 60 | Fill #0

## 2016-01-13 NOTE — Telephone Encounter (Signed)
RN advised patient request is ready and will be up front for him to pick-up. Priscille Heidelberg, RN, BSN

## 2016-01-30 MED FILL — CYCLOBENZAPRINE 5 MG TABLET: 5 | 30 days supply | Qty: 90 | Fill #2

## 2016-01-30 MED FILL — ?VALACYCLOVIR HCL 1 GRAM TA: 1 | 30 days supply | Qty: 30 | Fill #2

## 2016-02-13 ENCOUNTER — Other Ambulatory Visit: Payer: Self-pay | Admitting: Internal Medicine

## 2016-02-13 DIAGNOSIS — G8929 Other chronic pain: Secondary | ICD-10-CM

## 2016-02-13 DIAGNOSIS — M545 Low back pain: Principal | ICD-10-CM

## 2016-02-13 MED FILL — PRAVASTATIN NA 20 MG TAB: 20 | 30 days supply | Qty: 15 | Fill #9

## 2016-02-13 MED FILL — ATENOLOL/CHLORTHAL 50/25: 50-25 | 30 days supply | Qty: 30 | Fill #2

## 2016-02-16 MED FILL — traMADol HCL 50 MG TABS: 50 | 20 days supply | Qty: 60 | Fill #0

## 2016-02-16 NOTE — Telephone Encounter (Signed)
Medication Refill  °Tramadol  °

## 2016-03-07 MED FILL — valACYclovir HCL 1 GM TABS: 1 | 30 days supply | Qty: 30 | Fill #3

## 2016-03-16 MED FILL — ATENOLOL/CHLORTHAL 50/25: 50-25 | 30 days supply | Qty: 30 | Fill #3

## 2016-03-21 ENCOUNTER — Telehealth: Payer: Self-pay | Admitting: Internal Medicine

## 2016-03-21 DIAGNOSIS — E785 Hyperlipidemia, unspecified: Secondary | ICD-10-CM

## 2016-03-21 MED ORDER — PRAVASTATIN SODIUM 20 MG PO TABS: 10.0000 mg | ORAL_TABLET | Freq: Every day | ORAL | 0 refills | Status: DC

## 2016-03-21 NOTE — Telephone Encounter (Signed)
Patient came to the office to request medication refill for pravastatin (PRAVACHOL) 20 MG tablet. Please send prescription to our pharmacy.   Thank you.

## 2016-03-23 ENCOUNTER — Other Ambulatory Visit: Payer: Self-pay | Admitting: *Deleted

## 2016-03-23 DIAGNOSIS — E785 Hyperlipidemia, unspecified: Secondary | ICD-10-CM

## 2016-03-23 MED ORDER — PRAVASTATIN SODIUM 20 MG PO TABS: 10.0000 mg | ORAL_TABLET | Freq: Every day | ORAL | 0 refills | Status: DC

## 2016-03-23 MED FILL — PRAVASTATIN NA 20 MG TAB: 20 | 30 days supply | Qty: 15 | Fill #0

## 2016-03-28 ENCOUNTER — Encounter: Payer: Self-pay | Admitting: Internal Medicine

## 2016-03-28 ENCOUNTER — Ambulatory Visit: Payer: Self-pay | Attending: Internal Medicine | Admitting: Internal Medicine

## 2016-03-28 VITALS — BP 112/76 | HR 74 | Temp 98.4°F | Resp 18 | Ht 67.0 in | Wt 215.0 lb

## 2016-03-28 DIAGNOSIS — H9193 Unspecified hearing loss, bilateral: Secondary | ICD-10-CM

## 2016-03-28 DIAGNOSIS — E7800 Pure hypercholesterolemia, unspecified: Secondary | ICD-10-CM | POA: Insufficient documentation

## 2016-03-28 DIAGNOSIS — I10 Essential (primary) hypertension: Secondary | ICD-10-CM | POA: Insufficient documentation

## 2016-03-28 DIAGNOSIS — E785 Hyperlipidemia, unspecified: Secondary | ICD-10-CM | POA: Insufficient documentation

## 2016-03-28 DIAGNOSIS — Z7982 Long term (current) use of aspirin: Secondary | ICD-10-CM | POA: Insufficient documentation

## 2016-03-28 DIAGNOSIS — G8929 Other chronic pain: Secondary | ICD-10-CM | POA: Insufficient documentation

## 2016-03-28 DIAGNOSIS — Z79899 Other long term (current) drug therapy: Secondary | ICD-10-CM | POA: Insufficient documentation

## 2016-03-28 DIAGNOSIS — E079 Disorder of thyroid, unspecified: Secondary | ICD-10-CM | POA: Insufficient documentation

## 2016-03-28 DIAGNOSIS — B009 Herpesviral infection, unspecified: Secondary | ICD-10-CM | POA: Insufficient documentation

## 2016-03-28 DIAGNOSIS — M545 Low back pain: Secondary | ICD-10-CM | POA: Insufficient documentation

## 2016-03-28 DIAGNOSIS — Z20828 Contact with and (suspected) exposure to other viral communicable diseases: Secondary | ICD-10-CM | POA: Insufficient documentation

## 2016-03-28 MED ORDER — ATENOLOL-CHLORTHALIDONE 50-25 MG PO TABS: 1.0000 | ORAL_TABLET | Freq: Every day | ORAL | 3 refills | Status: DC

## 2016-03-28 MED ORDER — TRAMADOL HCL 50 MG PO TABS: 50.0000 mg | ORAL_TABLET | Freq: Three times a day (TID) | ORAL | 0 refills | Status: DC | PRN

## 2016-03-28 MED ORDER — CYCLOBENZAPRINE HCL 5 MG PO TABS: 5.0000 mg | ORAL_TABLET | Freq: Three times a day (TID) | ORAL | 3 refills | Status: DC | PRN

## 2016-03-28 MED ORDER — VALACYCLOVIR HCL 1 G PO TABS: 1000.0000 mg | ORAL_TABLET | Freq: Every day | ORAL | 12 refills | Status: DC

## 2016-03-28 MED ORDER — PRAVASTATIN SODIUM 20 MG PO TABS: 10.0000 mg | ORAL_TABLET | Freq: Every day | ORAL | 3 refills | Status: DC

## 2016-03-28 NOTE — Progress Notes (Signed)
Patient is here for Med refill  Patient denies pain at this time.  Patient has taken medication today. Patient has eaten today.  Patient complains of intermittent bilateral tinnitus being present over the past 5/6 years. Patient also complains of wheezing while breathing.
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
## 2016-03-28 NOTE — Patient Instructions (Signed)
Hypertension Hypertension, commonly called high blood pressure, is when the force of blood pumping through your arteries is too strong. Your arteries are the blood vessels that carry blood from your heart throughout your body. A blood pressure reading consists of a higher number over a lower number, such as 110/72. The higher number (systolic) is the pressure inside your arteries when your heart pumps. The lower number (diastolic) is the pressure inside your arteries when your heart relaxes. Ideally you want your blood pressure below 120/80. Hypertension forces your heart to work harder to pump blood. Your arteries may become narrow or stiff. Having untreated or uncontrolled hypertension can cause heart attack, stroke, kidney disease, and other problems. What increases the risk? Some risk factors for high blood pressure are controllable. Others are not. Risk factors you cannot control include:  Race. You may be at higher risk if you are African American.  Age. Risk increases with age.  Gender. Men are at higher risk than women before age 45 years. After age 65, women are at higher risk than men. Risk factors you can control include:  Not getting enough exercise or physical activity.  Being overweight.  Getting too much fat, sugar, calories, or salt in your diet.  Drinking too much alcohol. What are the signs or symptoms? Hypertension does not usually cause signs or symptoms. Extremely high blood pressure (hypertensive crisis) may cause headache, anxiety, shortness of breath, and nosebleed. How is this diagnosed? To check if you have hypertension, your health care provider will measure your blood pressure while you are seated, with your arm held at the level of your heart. It should be measured at least twice using the same arm. Certain conditions can cause a difference in blood pressure between your right and left arms. A blood pressure reading that is higher than normal on one occasion does  not mean that you need treatment. If it is not clear whether you have high blood pressure, you may be asked to return on a different day to have your blood pressure checked again. Or, you may be asked to monitor your blood pressure at home for 1 or more weeks. How is this treated? Treating high blood pressure includes making lifestyle changes and possibly taking medicine. Living a healthy lifestyle can help lower high blood pressure. You may need to change some of your habits. Lifestyle changes may include:  Following the DASH diet. This diet is high in fruits, vegetables, and whole grains. It is low in salt, red meat, and added sugars.  Keep your sodium intake below 2,300 mg per day.  Getting at least 30-45 minutes of aerobic exercise at least 4 times per week.  Losing weight if necessary.  Not smoking.  Limiting alcoholic beverages.  Learning ways to reduce stress. Your health care provider may prescribe medicine if lifestyle changes are not enough to get your blood pressure under control, and if one of the following is true:  You are 18-59 years of age and your systolic blood pressure is above 140.  You are 60 years of age or older, and your systolic blood pressure is above 150.  Your diastolic blood pressure is above 90.  You have diabetes, and your systolic blood pressure is over 140 or your diastolic blood pressure is over 90.  You have kidney disease and your blood pressure is above 140/90.  You have heart disease and your blood pressure is above 140/90. Your personal target blood pressure may vary depending on your medical   conditions, your age, and other factors. Follow these instructions at home:  Have your blood pressure rechecked as directed by your health care provider.  Take medicines only as directed by your health care provider. Follow the directions carefully. Blood pressure medicines must be taken as prescribed. The medicine does not work as well when you skip  doses. Skipping doses also puts you at risk for problems.  Do not smoke.  Monitor your blood pressure at home as directed by your health care provider. Contact a health care provider if:  You think you are having a reaction to medicines taken.  You have recurrent headaches or feel dizzy.  You have swelling in your ankles.  You have trouble with your vision. Get help right away if:  You develop a severe headache or confusion.  You have unusual weakness, numbness, or feel faint.  You have severe chest or abdominal pain.  You vomit repeatedly.  You have trouble breathing. This information is not intended to replace advice given to you by your health care provider. Make sure you discuss any questions you have with your health care provider. Document Released: 03/05/2005 Document Revised: 08/11/2015 Document Reviewed: 12/26/2012 Elsevier Interactive Patient Education  2017 Elsevier Inc.  

## 2016-03-29 MED ORDER — CARBAMIDE PEROXIDE 6.5 % OT SOLN: 5.0000 [drp] | Freq: Two times a day (BID) | OTIC | 0 refills | Status: DC

## 2016-03-29 MED FILL — ?VALACYCLOVIR HCL 1 GRAM TA: 1 | 30 days supply | Qty: 30 | Fill #0

## 2016-03-29 MED FILL — EARWAX TREATMENT 6.5% DROPS: 6.5 | 30 days supply | Qty: 15 | Fill #0

## 2016-03-30 MED FILL — CYCLOBENZAPRINE 5 MG TABLET: 5 | 30 days supply | Qty: 90 | Fill #0

## 2016-03-30 MED FILL — traMADol HCL 50 MG TABS: 50 | 20 days supply | Qty: 60 | Fill #0

## 2016-04-16 MED FILL — ?VALACYCLOVIR HCL 1 GRAM TA: 1 | 30 days supply | Qty: 30 | Fill #4

## 2016-04-16 MED FILL — ATENOLOL/CHLORTHAL 50/25: 50-25 | 30 days supply | Qty: 30 | Fill #4

## 2016-04-16 MED FILL — PRAVASTATIN NA 20 MG TAB: 20 | 30 days supply | Qty: 15 | Fill #1

## 2016-04-23 ENCOUNTER — Telehealth: Payer: Self-pay | Admitting: Internal Medicine

## 2016-04-23 NOTE — Telephone Encounter (Signed)
Patient called the office to request medication refill for traMADol (ULTRAM) 50 MG tablet. ° °Thank you.  °

## 2016-04-25 ENCOUNTER — Other Ambulatory Visit: Payer: Self-pay | Admitting: Internal Medicine

## 2016-04-25 DIAGNOSIS — M545 Low back pain, unspecified: Secondary | ICD-10-CM

## 2016-04-25 DIAGNOSIS — G8929 Other chronic pain: Secondary | ICD-10-CM

## 2016-04-25 MED ORDER — TRAMADOL HCL 50 MG PO TABS: 50.0000 mg | ORAL_TABLET | Freq: Three times a day (TID) | ORAL | 0 refills | Status: DC | PRN

## 2016-04-26 MED FILL — traMADol HCL 50 MG TABS: 50 | 20 days supply | Qty: 60 | Fill #0

## 2016-05-08 ENCOUNTER — Telehealth: Payer: Self-pay | Admitting: Internal Medicine

## 2016-05-08 NOTE — Telephone Encounter (Signed)
Patient is requesting a referral to pain clinic due to Tramadol not providing relief. Please advise.

## 2016-05-08 NOTE — Telephone Encounter (Signed)
Patient called the office to speak with [facility] regarding getting a referral for the pain clinic. Pt stated that medication Tramadol is not helping with his back pain. Please follow up.  Thank you.

## 2016-05-15 MED FILL — PRAVASTATIN NA 20 MG TAB: 20 | 30 days supply | Qty: 15 | Fill #0

## 2016-05-15 MED FILL — ?VALACYCLOVIR HCL 1 GRAM TA: 1 | 30 days supply | Qty: 30 | Fill #5

## 2016-05-15 MED FILL — ATENOLOL/CHLORTHAL 50/25: 50-25 | 30 days supply | Qty: 30 | Fill #5

## 2016-05-17 NOTE — Telephone Encounter (Signed)
Medication Refill: traMADol (ULTRAM) 50 MG tablet

## 2016-05-18 NOTE — Telephone Encounter (Signed)
Pt checking in on Tramadol refill request. Would like to be notified when refill has been done. Thank you

## 2016-05-21 ENCOUNTER — Other Ambulatory Visit: Payer: Self-pay | Admitting: Internal Medicine

## 2016-05-21 DIAGNOSIS — M545 Low back pain, unspecified: Secondary | ICD-10-CM

## 2016-05-21 DIAGNOSIS — G8929 Other chronic pain: Secondary | ICD-10-CM

## 2016-05-21 MED ORDER — TRAMADOL HCL 50 MG PO TABS: 50.0000 mg | ORAL_TABLET | Freq: Three times a day (TID) | ORAL | 0 refills | Status: DC | PRN

## 2016-05-21 MED FILL — traMADol HCL 50 MG TABS: 50 | 20 days supply | Qty: 60 | Fill #0

## 2016-05-21 NOTE — Telephone Encounter (Signed)
Patient picked up his prescription today.

## 2016-05-21 NOTE — Telephone Encounter (Signed)
Patient is requesting a refill on Tramadol. Last received on 04/25/16

## 2016-05-21 NOTE — Telephone Encounter (Signed)
Patient had been referred to pain clinic in the past and was let go due to violation of policies

## 2016-06-06 ENCOUNTER — Telehealth: Payer: Self-pay | Admitting: Internal Medicine

## 2016-06-06 NOTE — Telephone Encounter (Signed)
Patient came by the office to provide his medical insurance information for his referral. Please follow up.  Thank you.

## 2016-06-06 NOTE — Telephone Encounter (Signed)
Thank you  He call me back  Sent referral to Dr Mirna Mires for [facility] for hearing loss waiting for appointments.  Patient aware of that

## 2016-06-12 ENCOUNTER — Telehealth: Payer: Self-pay | Admitting: Internal Medicine

## 2016-06-12 NOTE — Telephone Encounter (Signed)
Patient came here to follow up in his pain referral .  I sent his referral to [facility]26 and the Dr is reviewing the notes and if they decide to see him appointment will be on May. Patient want to know if Dr Doreene Burke can prescribe  Tramadol  .

## 2016-06-13 NOTE — Telephone Encounter (Signed)
Patients referral is being reviewed for a may appointment. Patient is requesting a tramadol refill. Patient is not due for a refill until 06/21/16. Last filled 05/21/16

## 2016-06-13 NOTE — Telephone Encounter (Signed)
Will refill on 4/5

## 2016-06-19 ENCOUNTER — Telehealth: Payer: Self-pay

## 2016-06-19 MED FILL — PRAVASTATIN NA 20 MG TAB: 20 | 30 days supply | Qty: 15 | Fill #1

## 2016-06-19 MED FILL — ATENOLOL/CHLORTHAL 50/25: 50-25 | 30 days supply | Qty: 30 | Fill #6

## 2016-06-19 NOTE — Telephone Encounter (Signed)
Pt contacted the office and is requesting his Tramadol refill. Please f/u

## 2016-06-19 NOTE — Telephone Encounter (Signed)
Patient is now requesting the refill on Tramadol.

## 2016-06-22 ENCOUNTER — Other Ambulatory Visit: Payer: Self-pay | Admitting: Internal Medicine

## 2016-06-22 DIAGNOSIS — G8929 Other chronic pain: Secondary | ICD-10-CM

## 2016-06-22 DIAGNOSIS — M545 Low back pain: Principal | ICD-10-CM

## 2016-06-22 MED ORDER — TRAMADOL HCL 50 MG PO TABS: 50.0000 mg | ORAL_TABLET | Freq: Three times a day (TID) | ORAL | 0 refills | Status: DC | PRN

## 2016-06-22 MED FILL — traMADol HCL 50 MG TABS: 50 | 20 days supply | Qty: 60 | Fill #0

## 2016-06-26 ENCOUNTER — Telehealth: Payer: Self-pay | Admitting: Internal Medicine

## 2016-06-26 NOTE — Telephone Encounter (Signed)
Patient is needing a hearing test prior to being seen. Can this be scheduled as a nurse visit?

## 2016-06-26 NOTE — Telephone Encounter (Signed)
Dr Minna Merritts office call me to let me know that Corey Hale need a Hearing test complete before seen . Please made a referral for audiology  Thank you .

## 2016-06-27 ENCOUNTER — Ambulatory Visit: Payer: No Typology Code available for payment source | Attending: Internal Medicine | Admitting: Internal Medicine

## 2016-06-27 ENCOUNTER — Encounter: Payer: Self-pay | Admitting: Internal Medicine

## 2016-06-27 VITALS — BP 124/77 | HR 73 | Temp 98.6°F | Resp 18 | Ht 72.5 in | Wt 236.6 lb

## 2016-06-27 DIAGNOSIS — E785 Hyperlipidemia, unspecified: Secondary | ICD-10-CM | POA: Insufficient documentation

## 2016-06-27 DIAGNOSIS — M545 Low back pain: Secondary | ICD-10-CM | POA: Insufficient documentation

## 2016-06-27 DIAGNOSIS — Z1211 Encounter for screening for malignant neoplasm of colon: Secondary | ICD-10-CM

## 2016-06-27 DIAGNOSIS — E7800 Pure hypercholesterolemia, unspecified: Secondary | ICD-10-CM | POA: Insufficient documentation

## 2016-06-27 DIAGNOSIS — Z79899 Other long term (current) drug therapy: Secondary | ICD-10-CM | POA: Insufficient documentation

## 2016-06-27 DIAGNOSIS — G8929 Other chronic pain: Secondary | ICD-10-CM | POA: Diagnosis not present

## 2016-06-27 DIAGNOSIS — I10 Essential (primary) hypertension: Secondary | ICD-10-CM | POA: Diagnosis not present

## 2016-06-27 DIAGNOSIS — E079 Disorder of thyroid, unspecified: Secondary | ICD-10-CM | POA: Insufficient documentation

## 2016-06-27 DIAGNOSIS — Z7982 Long term (current) use of aspirin: Secondary | ICD-10-CM | POA: Insufficient documentation

## 2016-06-27 MED ORDER — PRAVASTATIN SODIUM 20 MG PO TABS: 10.0000 mg | ORAL_TABLET | Freq: Every day | ORAL | 3 refills | Status: DC

## 2016-06-27 MED ORDER — TRAMADOL HCL 50 MG PO TABS: 50.0000 mg | ORAL_TABLET | Freq: Three times a day (TID) | ORAL | 0 refills | Status: DC | PRN

## 2016-06-27 MED ORDER — ATENOLOL-CHLORTHALIDONE 50-25 MG PO TABS: 1.0000 | ORAL_TABLET | Freq: Every day | ORAL | 3 refills | Status: DC

## 2016-06-27 MED ORDER — CYCLOBENZAPRINE HCL 5 MG PO TABS: 5.0000 mg | ORAL_TABLET | Freq: Three times a day (TID) | ORAL | 3 refills | Status: DC | PRN

## 2016-06-27 MED FILL — CYCLOBENZAPRINE 5 MG TABLET: 5 | 30 days supply | Qty: 90 | Fill #0

## 2016-06-27 NOTE — Progress Notes (Signed)
Patient is here for FU HTN  Patient denies pain at this time.  Patient has taken medication today. Patient has eaten cake today.  Patient request a colonoscopy referral. Patient received tramadol on 06/22/16.

## 2016-06-27 NOTE — Progress Notes (Signed)
 Corey Hale, is a 51 y.o. male  CSN:[id number]  MRN:[id number]  DOB - 02/03/1966  Chief Complaint  Patient presents with  . Colonoscopy  . Hypertension      Subjective:   Corey Hale is a 51 y.o. male with history of chronic low back pain, hypertension, hyperlipidemia and chronic herpes infection here today for a follow up visit and medication refills. Patient has no new complaint today except for ongoing pain in his lower back for which he was referred to pain clinic but he said he stopped going because the pain specialist was prescribing the same tramadol that we prescribed. Patient has No headache, No chest pain, No abdominal pain - No Nausea, No new weakness tingling or numbness, No Cough - SOB. Patient is due for Colonoscopy for routine colon cancer screening.  No problems updated.  ALLERGIES: No Known Allergies  PAST MEDICAL HISTORY: Past Medical History:  Diagnosis Date  . Chronic back pain   . Hypercholesteremia   . Hypertension   . Thyroid disease     MEDICATIONS AT HOME: Prior to Admission medications   Medication Sig Start Date End Date Taking? Authorizing Provider  aspirin 81 MG tablet Take 1 tablet (81 mg total) by mouth daily. 03/07/15  Yes Olugbemiga E Jegede, MD  atenolol-chlorthalidone (TENORETIC) 50-25 MG tablet Take 1 tablet by mouth daily. 06/27/16  Yes Olugbemiga E Jegede, MD  carbamide peroxide (DEBROX) 6.5 % otic solution Place 5 drops into both ears 2 (two) times daily. 03/29/16  Yes Olugbemiga E Jegede, MD  cyclobenzaprine (FLEXERIL) 5 MG tablet Take 1 tablet (5 mg total) by mouth 3 (three) times daily as needed for muscle spasms (bACK pAIN). 06/27/16  Yes Olugbemiga E Jegede, MD  pravastatin (PRAVACHOL) 20 MG tablet Take 0.5 tablets (10 mg total) by mouth daily. 06/27/16  Yes Olugbemiga E Jegede, MD  traMADol (ULTRAM) 50 MG tablet Take 1 tablet (50 mg total) by mouth every 8 (eight) hours as needed. 06/27/16  Yes Olugbemiga E Jegede, MD  valACYclovir  (VALTREX) 1000 MG tablet Take 1 tablet (1,000 mg total) by mouth daily. 03/28/16  Yes Olugbemiga E Jegede, MD    Objective:   Vitals:   06/27/16 1052  BP: 124/77  Pulse: 73  Resp: 18  Temp: 98.6 F (37 C)  TempSrc: Oral  SpO2: 95%  Weight: 236 lb 9.6 oz (107.3 kg)  Height: 6' 0.5" (1.842 m)   Exam General appearance : Awake, alert, not in any distress. Speech Clear. Not toxic looking HEENT: Atraumatic and Normocephalic, pupils equally reactive to light and accomodation Neck: Supple, no JVD. No cervical lymphadenopathy.  Chest: Good air entry bilaterally, no added sounds  CVS: S1 S2 regular, no murmurs.  Abdomen: Bowel sounds present, Non tender and not distended with no gaurding, rigidity or rebound. Extremities: B/L Lower Ext shows no edema, both legs are warm to touch Neurology: Awake alert, and oriented X 3, CN II-XII intact, Non focal Skin: No Rash  Data Review Lab Results  Component Value Date   HGBA1C 5.7 (H) 06/10/2012    Assessment & Plan   1. Essential hypertension: Controlled  - atenolol-chlorthalidone (TENORETIC) 50-25 MG tablet; Take 1 tablet by mouth daily.  Dispense: 90 tablet; Refill: 3 - CBC with Differential/Platelet - CMP14+EGFR - Lipid panel - TSH  2. Dyslipidemia  - pravastatin (PRAVACHOL) 20 MG tablet; Take 0.5 tablets (10 mg total) by mouth daily.  Dispense: 45 tablet; Refill: 3  3. Chronic bilateral low back pain without sciatica  -   traMADol (ULTRAM) 50 MG tablet; Take 1 tablet (50 mg total) by mouth every 8 (eight) hours as needed.  Dispense: 30 tablet; Refill: 0 - cyclobenzaprine (FLEXERIL) 5 MG tablet; Take 1 tablet (5 mg total) by mouth 3 (three) times daily as needed for muscle spasms (bACK pAIN).  Dispense: 90 tablet; Refill: 3  4. Colon cancer screening  - Ambulatory referral to Gastroenterology  Patient have been counseled extensively about nutrition and exercise. Other issues discussed during this visit include: low cholesterol  diet, weight control and daily exercise, importance of adherence with medications and regular follow-up. We also discussed long term complications of uncontrolled hypertension.   Return in about 6 months (around 12/27/2016) for Follow up HTN, Follow up Pain and comorbidities.  The patient was given clear instructions to go to ER or return to medical center if symptoms don't improve, worsen or new problems develop. The patient verbalized understanding. The patient was told to call to get lab results if they haven't heard anything in the next week.   This note has been created with Dragon speech recognition software and smart phrase technology. Any transcriptional errors are unintentional.    JEGEDE, OLUGBEMIGA, MD, MHA, FACP, FAAP, CPE [facility] [place], [place] [phone]   06/27/2016, 11:32 AM 

## 2016-06-27 NOTE — Patient Instructions (Signed)
Back Pain, Adult Back pain is very common in adults.The cause of back pain is rarely dangerous and the pain often gets better over time.The cause of your back pain may not be known. Some common causes of back pain include:  Strain of the muscles or ligaments supporting the spine.  Wear and tear (degeneration) of the spinal disks.  Arthritis.  Direct injury to the back. For many people, back pain may return. Since back pain is rarely dangerous, most people can learn to manage this condition on their own. Follow these instructions at home: Watch your back pain for any changes. The following actions may help to lessen any discomfort you are feeling:  Remain active. It is stressful on your back to sit or stand in one place for long periods of time. Do not sit, drive, or stand in one place for more than 30 minutes at a time. Take short walks on even surfaces as soon as you are able.Try to increase the length of time you walk each day.  Exercise regularly as directed by your health care provider. Exercise helps your back heal faster. It also helps avoid future injury by keeping your muscles strong and flexible.  Do not stay in bed.Resting more than 1-2 days can delay your recovery.  Pay attention to your body when you bend and lift. The most comfortable positions are those that put less stress on your recovering back. Always use proper lifting techniques, including:  Bending your knees.  Keeping the load close to your body.  Avoiding twisting.  Find a comfortable position to sleep. Use a firm mattress and lie on your side with your knees slightly bent. If you lie on your back, put a pillow under your knees.  Avoid feeling anxious or stressed.Stress increases muscle tension and can worsen back pain.It is important to recognize when you are anxious or stressed and learn ways to manage it, such as with exercise.  Take medicines only as directed by your health care provider.  Over-the-counter medicines to reduce pain and inflammation are often the most helpful.Your health care provider may prescribe muscle relaxant drugs.These medicines help dull your pain so you can more quickly return to your normal activities and healthy exercise.  Apply ice to the injured area:  Put ice in a plastic bag.  Place a towel between your skin and the bag.  Leave the ice on for 20 minutes, 2-3 times a day for the first 2-3 days. After that, ice and heat may be alternated to reduce pain and spasms.  Maintain a healthy weight. Excess weight puts extra stress on your back and makes it difficult to maintain good posture. Contact a health care provider if:  You have pain that is not relieved with rest or medicine.  You have increasing pain going down into the legs or buttocks.  You have pain that does not improve in one week.  You have night pain.  You lose weight.  You have a fever or chills. Get help right away if:  You develop new bowel or bladder control problems.  You have unusual weakness or numbness in your arms or legs.  You develop nausea or vomiting.  You develop abdominal pain.  You feel faint. This information is not intended to replace advice given to you by your health care provider. Make sure you discuss any questions you have with your health care provider. Document Released: 03/05/2005 Document Revised: 07/14/2015 Document Reviewed: 07/07/2013 Elsevier Interactive Patient Education  2017 Elsevier   Inc. Hypertension Hypertension, commonly called high blood pressure, is when the force of blood pumping through the arteries is too strong. The arteries are the blood vessels that carry blood from the heart throughout the body. Hypertension forces the heart to work harder to pump blood and may cause arteries to become narrow or stiff. Having untreated or uncontrolled hypertension can cause heart attacks, strokes, kidney disease, and other problems. A blood  pressure reading consists of a higher number over a lower number. Ideally, your blood pressure should be below 120/80. The first ("top") number is called the systolic pressure. It is a measure of the pressure in your arteries as your heart beats. The second ("bottom") number is called the diastolic pressure. It is a measure of the pressure in your arteries as the heart relaxes. What are the causes? The cause of this condition is not known. What increases the risk? Some risk factors for high blood pressure are under your control. Others are not. Factors you can change   Smoking.  Having type 2 diabetes mellitus, high cholesterol, or both.  Not getting enough exercise or physical activity.  Being overweight.  Having too much fat, sugar, calories, or salt (sodium) in your diet.  Drinking too much alcohol. Factors that are difficult or impossible to change   Having chronic kidney disease.  Having a family history of high blood pressure.  Age. Risk increases with age.  Race. You may be at higher risk if you are African-American.  Gender. Men are at higher risk than women before age 85. After age 2, women are at higher risk than men.  Having obstructive sleep apnea.  Stress. What are the signs or symptoms? Extremely high blood pressure (hypertensive crisis) may cause:  Headache.  Anxiety.  Shortness of breath.  Nosebleed.  Nausea and vomiting.  Severe chest pain.  Jerky movements you cannot control (seizures). How is this diagnosed? This condition is diagnosed by measuring your blood pressure while you are seated, with your arm resting on a surface. The cuff of the blood pressure monitor will be placed directly against the skin of your upper arm at the level of your heart. It should be measured at least twice using the same arm. Certain conditions can cause a difference in blood pressure between your right and left arms. Certain factors can cause blood pressure readings  to be lower or higher than normal (elevated) for a short period of time:  When your blood pressure is higher when you are in a health care provider's office than when you are at home, this is called white coat hypertension. Most people with this condition do not need medicines.  When your blood pressure is higher at home than when you are in a health care provider's office, this is called masked hypertension. Most people with this condition may need medicines to control blood pressure. If you have a high blood pressure reading during one visit or you have normal blood pressure with other risk factors:  You may be asked to return on a different day to have your blood pressure checked again.  You may be asked to monitor your blood pressure at home for 1 week or longer. If you are diagnosed with hypertension, you may have other blood or imaging tests to help your health care provider understand your overall risk for other conditions. How is this treated? This condition is treated by making healthy lifestyle changes, such as eating healthy foods, exercising more, and reducing your alcohol intake.  Your health care provider may prescribe medicine if lifestyle changes are not enough to get your blood pressure under control, and if:  Your systolic blood pressure is above 130.  Your diastolic blood pressure is above 80. Your personal target blood pressure may vary depending on your medical conditions, your age, and other factors. Follow these instructions at home: Eating and drinking   Eat a diet that is high in fiber and potassium, and low in sodium, added sugar, and fat. An example eating plan is called the DASH (Dietary Approaches to Stop Hypertension) diet. To eat this way:  Eat plenty of fresh fruits and vegetables. Try to fill half of your plate at each meal with fruits and vegetables.  Eat whole grains, such as whole wheat pasta, brown rice, or whole grain bread. Fill about one quarter of your  plate with whole grains.  Eat or drink low-fat dairy products, such as skim milk or low-fat yogurt.  Avoid fatty cuts of meat, processed or cured meats, and poultry with skin. Fill about one quarter of your plate with lean proteins, such as fish, chicken without skin, beans, eggs, and tofu.  Avoid premade and processed foods. These tend to be higher in sodium, added sugar, and fat.  Reduce your daily sodium intake. Most people with hypertension should eat less than 1,500 mg of sodium a day.  Limit alcohol intake to no more than 1 drink a day for nonpregnant women and 2 drinks a day for men. One drink equals 12 oz of beer, 5 oz of wine, or 1 oz of hard liquor. Lifestyle   Work with your health care provider to maintain a healthy body weight or to lose weight. Ask what an ideal weight is for you.  Get at least 30 minutes of exercise that causes your heart to beat faster (aerobic exercise) most days of the week. Activities may include walking, swimming, or biking.  Include exercise to strengthen your muscles (resistance exercise), such as pilates or lifting weights, as part of your weekly exercise routine. Try to do these types of exercises for 30 minutes at least 3 days a week.  Do not use any products that contain nicotine or tobacco, such as cigarettes and e-cigarettes. If you need help quitting, ask your health care provider.  Monitor your blood pressure at home as told by your health care provider.  Keep all follow-up visits as told by your health care provider. This is important. Medicines   Take over-the-counter and prescription medicines only as told by your health care provider. Follow directions carefully. Blood pressure medicines must be taken as prescribed.  Do not skip doses of blood pressure medicine. Doing this puts you at risk for problems and can make the medicine less effective.  Ask your health care provider about side effects or reactions to medicines that you should  watch for. Contact a health care provider if:  You think you are having a reaction to a medicine you are taking.  You have headaches that keep coming back (recurring).  You feel dizzy.  You have swelling in your ankles.  You have trouble with your vision. Get help right away if:  You develop a severe headache or confusion.  You have unusual weakness or numbness.  You feel faint.  You have severe pain in your chest or abdomen.  You vomit repeatedly.  You have trouble breathing. Summary  Hypertension is when the force of blood pumping through your arteries is too strong. If this  condition is not controlled, it may put you at risk for serious complications.  Your personal target blood pressure may vary depending on your medical conditions, your age, and other factors. For most people, a normal blood pressure is less than 120/80.  Hypertension is treated with lifestyle changes, medicines, or a combination of both. Lifestyle changes include weight loss, eating a healthy, low-sodium diet, exercising more, and limiting alcohol. This information is not intended to replace advice given to you by your health care provider. Make sure you discuss any questions you have with your health care provider. Document Released: 03/05/2005 Document Revised: 02/01/2016 Document Reviewed: 02/01/2016 Elsevier Interactive Patient Education  2017 [employer].

## 2016-06-28 LAB — LIPID PANEL
CHOLESTEROL TOTAL: 210 mg/dL — AB (ref 100–199)
Chol/HDL Ratio: 5.7 ratio — ABNORMAL HIGH (ref 0.0–5.0)
HDL: 37 mg/dL — ABNORMAL LOW (ref >39)
LDL CALC: 132 mg/dL — AB (ref 0–99)
Triglycerides: 204 mg/dL — ABNORMAL HIGH (ref 0–149)
VLDL CHOLESTEROL CAL: 41 mg/dL — AB (ref 5–40)

## 2016-06-28 LAB — CMP14+EGFR
ALBUMIN: 4.7 g/dL (ref 3.5–5.5)
ALK PHOS: 72 IU/L (ref 39–117)
ALT: 58 IU/L — ABNORMAL HIGH (ref 0–44)
AST: 32 IU/L (ref 0–40)
Albumin/Globulin Ratio: 1.5 (ref 1.2–2.2)
BUN / CREAT RATIO: 12 (ref 9–20)
BUN: 11 mg/dL (ref 6–24)
Bilirubin Total: 0.4 mg/dL (ref 0.0–1.2)
CALCIUM: 9.8 mg/dL (ref 8.7–10.2)
CO2: 27 mmol/L (ref 18–29)
CREATININE: 0.94 mg/dL (ref 0.76–1.27)
Chloride: 94 mmol/L — ABNORMAL LOW (ref 96–106)
GFR calc Af Amer: 109 mL/min/{1.73_m2} (ref >59)
GFR, EST NON AFRICAN AMERICAN: 94 mL/min/{1.73_m2} (ref >59)
GLUCOSE: 107 mg/dL — AB (ref 65–99)
Globulin, Total: 3.1 g/dL (ref 1.5–4.5)
Potassium: 3.5 mmol/L (ref 3.5–5.2)
Sodium: 137 mmol/L (ref 134–144)
TOTAL PROTEIN: 7.8 g/dL (ref 6.0–8.5)

## 2016-06-28 LAB — CBC WITH DIFFERENTIAL/PLATELET
BASOS ABS: 0.1 10*3/uL (ref 0.0–0.2)
Basos: 1 %
EOS (ABSOLUTE): 0.5 10*3/uL — ABNORMAL HIGH (ref 0.0–0.4)
Eos: 4 %
HEMOGLOBIN: 14.8 g/dL (ref 13.0–17.7)
Hematocrit: 43.3 % (ref 37.5–51.0)
IMMATURE GRANS (ABS): 0 10*3/uL (ref 0.0–0.1)
IMMATURE GRANULOCYTES: 0 %
LYMPHS: 27 %
Lymphocytes Absolute: 3.2 10*3/uL — ABNORMAL HIGH (ref 0.7–3.1)
MCH: 28.7 pg (ref 26.6–33.0)
MCHC: 34.2 g/dL (ref 31.5–35.7)
MCV: 84 fL (ref 79–97)
MONOCYTES: 9 %
Monocytes Absolute: 1 10*3/uL — ABNORMAL HIGH (ref 0.1–0.9)
Neutrophils Absolute: 6.8 10*3/uL (ref 1.4–7.0)
Neutrophils: 59 %
Platelets: 287 10*3/uL (ref 150–379)
RBC: 5.15 x10E6/uL (ref 4.14–5.80)
RDW: 14.9 % (ref 12.3–15.4)
WBC: 11.6 10*3/uL — AB (ref 3.4–10.8)

## 2016-06-28 LAB — TSH: TSH: 1.85 u[IU]/mL (ref 0.450–4.500)

## 2016-07-06 ENCOUNTER — Telehealth: Payer: Self-pay | Admitting: *Deleted

## 2016-07-06 NOTE — Telephone Encounter (Signed)
Patient verified DOB Patient is aware of labs being normal and cholesterol being high. Patient advised to continue with medications.

## 2016-07-06 NOTE — Telephone Encounter (Signed)
-----   Message from Tresa Garter, MD sent at 07/04/2016 11:57 AM EDT ----- Normal lab results except for high cholesterol. Please take your cholesterol medication as prescribed and please limit saturated fat to no more than 7% of your calories, limit cholesterol to 200 mg/day, increase fiber and exercise as tolerated. If needed we may add another cholesterol lowering medication to your regimen.

## 2016-07-06 NOTE — Telephone Encounter (Signed)
Audiology referral was placed during patients visit on 07/04/16. Medical Assistant left message on patient's home and cell voicemail. Voicemail states to give a call back to [place] with [facility] at [phone].

## 2016-07-09 MED FILL — traMADol HCL 50 MG TABS: 50 | 10 days supply | Qty: 30 | Fill #0

## 2016-07-18 ENCOUNTER — Other Ambulatory Visit: Payer: Self-pay | Admitting: Internal Medicine

## 2016-07-18 DIAGNOSIS — E785 Hyperlipidemia, unspecified: Secondary | ICD-10-CM

## 2016-07-18 MED FILL — ATENOLOL/CHLORTHAL 50/25: 50-25 | 30 days supply | Qty: 30 | Fill #7

## 2016-07-18 MED FILL — PRAVASTATIN NA 20 MG TAB: 20 | 30 days supply | Qty: 15 | Fill #0

## 2016-07-19 ENCOUNTER — Telehealth: Payer: Self-pay | Admitting: Internal Medicine

## 2016-07-19 NOTE — Telephone Encounter (Signed)
Patient called the office to request medication refill for traMADol (ULTRAM) 50 MG tablet. Pt stated that his provider would dispense 90 pills a month.   Thank you.

## 2016-07-23 ENCOUNTER — Other Ambulatory Visit: Payer: Self-pay | Admitting: Internal Medicine

## 2016-07-23 DIAGNOSIS — M545 Low back pain, unspecified: Secondary | ICD-10-CM

## 2016-07-23 DIAGNOSIS — G8929 Other chronic pain: Secondary | ICD-10-CM

## 2016-07-23 MED ORDER — TRAMADOL HCL 50 MG PO TABS: 50.0000 mg | ORAL_TABLET | Freq: Three times a day (TID) | ORAL | 0 refills | Status: DC | PRN

## 2016-07-23 MED FILL — traMADol HCL 50 MG TABS: 50 | 30 days supply | Qty: 90 | Fill #0

## 2016-07-24 ENCOUNTER — Telehealth: Payer: Self-pay | Admitting: Physical Medicine & Rehabilitation

## 2016-07-27 ENCOUNTER — Encounter: Payer: Self-pay | Admitting: Internal Medicine

## 2016-07-30 MED FILL — valACYclovir HCL 1 GM TABS: 1 | 30 days supply | Qty: 30 | Fill #6

## 2016-08-28 MED FILL — ATENOLOL/CHLORTHAL 50/25: 50-25 | 30 days supply | Qty: 30 | Fill #8

## 2016-08-28 MED FILL — PRAVASTATIN NA 20 MG TAB: 20 | 30 days supply | Qty: 15 | Fill #1

## 2016-10-01 MED FILL — ATENOLOL/CHLORTHAL 50/25: 50-25 | 30 days supply | Qty: 30 | Fill #9

## 2016-10-01 MED FILL — ?VALACYCLOVIR HCL 1 GRAM TA: 1 | 30 days supply | Qty: 30 | Fill #7

## 2016-10-01 MED FILL — PRAVASTATIN NA 20 MG TAB: 20 | 30 days supply | Qty: 15 | Fill #2

## 2016-10-14 ENCOUNTER — Encounter (HOSPITAL_COMMUNITY): Payer: Self-pay | Admitting: Emergency Medicine

## 2016-10-14 ENCOUNTER — Emergency Department (HOSPITAL_COMMUNITY)
Admission: EM | Admit: 2016-10-14 | Discharge: 2016-10-14 | Disposition: A | Discharge status: Home / Self Care | Payer: No Typology Code available for payment source | Attending: Emergency Medicine | Admitting: Emergency Medicine

## 2016-10-14 ENCOUNTER — Other Ambulatory Visit: Payer: Self-pay

## 2016-10-14 ENCOUNTER — Emergency Department (HOSPITAL_COMMUNITY): Payer: No Typology Code available for payment source

## 2016-10-14 DIAGNOSIS — I10 Essential (primary) hypertension: Secondary | ICD-10-CM | POA: Insufficient documentation

## 2016-10-14 DIAGNOSIS — R072 Precordial pain: Secondary | ICD-10-CM | POA: Insufficient documentation

## 2016-10-14 DIAGNOSIS — F17200 Nicotine dependence, unspecified, uncomplicated: Secondary | ICD-10-CM | POA: Insufficient documentation

## 2016-10-14 DIAGNOSIS — Z79899 Other long term (current) drug therapy: Secondary | ICD-10-CM | POA: Insufficient documentation

## 2016-10-14 DIAGNOSIS — Z7982 Long term (current) use of aspirin: Secondary | ICD-10-CM | POA: Insufficient documentation

## 2016-10-14 LAB — BASIC METABOLIC PANEL
Anion gap: 9 (ref 5–15)
BUN: 14 mg/dL (ref 6–20)
CALCIUM: 9.3 mg/dL (ref 8.9–10.3)
CO2: 27 mmol/L (ref 22–32)
Chloride: 100 mmol/L — ABNORMAL LOW (ref 101–111)
Creatinine, Ser: 1.22 mg/dL (ref 0.61–1.24)
GFR calc Af Amer: >60 mL/min (ref >60)
GLUCOSE: 95 mg/dL (ref 65–99)
Potassium: 3.2 mmol/L — ABNORMAL LOW (ref 3.5–5.1)
Sodium: 136 mmol/L (ref 135–145)

## 2016-10-14 LAB — CBC
HCT: 40.4 % (ref 39.0–52.0)
Hemoglobin: 13.4 g/dL (ref 13.0–17.0)
MCH: 28.5 pg (ref 26.0–34.0)
MCHC: 33.2 g/dL (ref 30.0–36.0)
MCV: 85.8 fL (ref 78.0–100.0)
Platelets: 237 10*3/uL (ref 150–400)
RBC: 4.71 MIL/uL (ref 4.22–5.81)
RDW: 13.8 % (ref 11.5–15.5)
WBC: 8 10*3/uL (ref 4.0–10.5)

## 2016-10-14 LAB — I-STAT TROPONIN, ED
TROPONIN I, POC: 0.01 ng/mL (ref 0.00–0.08)
Troponin i, poc: 0.04 ng/mL (ref 0.00–0.08)

## 2016-10-14 MED ORDER — ASPIRIN 81 MG PO CHEW
324.0000 mg | CHEWABLE_TABLET | Freq: Once | ORAL | Status: AC
  Administered 2016-10-14: 324 mg via ORAL
  Filled 2016-10-14: qty 4

## 2016-10-14 MED ORDER — NITROGLYCERIN 0.4 MG SL SUBL
0.4000 mg | SUBLINGUAL_TABLET | SUBLINGUAL | Status: DC | PRN
Start: 2016-10-14 — End: 2016-10-14
  Administered 2016-10-14 (×2): 0.4 mg via SUBLINGUAL
  Filled 2016-10-14: qty 1

## 2016-10-14 NOTE — ED Triage Notes (Signed)
Pt c/o central chest pain onset tonight while at rest.  St's also felt shortness of breath.  Pt denies nausea or vomiting.

## 2016-10-14 NOTE — Discharge Instructions (Signed)

## 2016-10-14 NOTE — ED Provider Notes (Signed)
[place] DEPT Provider Note   CSN: [id number] Arrival date & time: 10/14/16  0125  By signing my name below, I, Ny'Kea Lewis, attest that this documentation has been prepared under the direction and in the presence of Ripley Fraise, MD. Electronically Signed: Lise Auer, ED Scribe. 10/14/16. 3:21 AM.  History   Chief Complaint Chief Complaint  Patient presents with  . Chest Pain   The history is provided by the patient. No language interpreter was used.   HPI HPI Comments: Corey Hale is a 51 y.o. male with a PMHx of HLD and HTN, who presents to the Emergency Department complaining of sudden onset, intermittent centralized chest tightness and chest pain that began at 6 pm tonight. Pt reports this evening he began to experience intermittent episodes of chest tightness that lasted a few minutes and resolved but it returned. Denies recent heavy lifting or traumatic injury.  No h/o PE/DVT or recent long travel. At this time he notes mild chest tightness. He reports alleviation of his symptoms with belching. He endorses minimum tobacco usage. Denies sweats, shortness of breath, leg swelling, nausea, dizziness, or weakness.    Past Medical History:  Diagnosis Date  . Chronic back pain   . Hypercholesteremia   . Hypertension   . Thyroid disease     Patient Active Problem List   Diagnosis Date Noted  . Herpes exposure 03/28/2016  . Chronic back pain 05/30/2015  . Mid back pain, chronic 05/26/2015  . Chronic midline low back pain without sciatica 05/26/2015  . Midline thoracic back pain 03/07/2015  . Back pain at L4-L5 level 07/06/2014  . History of vertebral fracture 05/20/2014  . Essential hypertension 02/23/2014  . 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 Hearing loss 09/07/2013  . Dental caries 06/10/2012  . Erectile dysfunction 06/10/2012  . Tobacco abuse 06/10/2012  . Dyslipidemia 06/10/2012  . Personal history of goiter 06/10/2012  . Genital herpes 03/05/2012  . Thoracic  back pain 07/21/2009  . THYROID NODULE, HX OF 07/21/2009    Past Surgical History:  Procedure Laterality Date  . ARM HARDWARE REMOVAL Right   . NECK SURGERY      Home Medications    Prior to Admission medications   Medication Sig Start Date End Date Taking? Authorizing Provider  aspirin 81 MG tablet Take 1 tablet (81 mg total) by mouth daily. 03/07/15   Tresa Garter, MD  atenolol-chlorthalidone (TENORETIC) 50-25 MG tablet Take 1 tablet by mouth daily. 06/27/16   Tresa Garter, MD  carbamide peroxide (DEBROX) 6.5 % otic solution Place 5 drops into both ears 2 (two) times daily. 03/29/16   Tresa Garter, MD  cyclobenzaprine (FLEXERIL) 5 MG tablet Take 1 tablet (5 mg total) by mouth 3 (three) times daily as needed for muscle spasms (bACK pAIN). 06/27/16   Tresa Garter, MD  pravastatin (PRAVACHOL) 20 MG tablet Take 0.5 tablets (10 mg total) by mouth daily. 07/18/16   Tresa Garter, MD  traMADol (ULTRAM) 50 MG tablet Take 1 tablet (50 mg total) by mouth every 8 (eight) hours as needed. 07/23/16   Tresa Garter, MD  valACYclovir (VALTREX) 1000 MG tablet Take 1 tablet (1,000 mg total) by mouth daily. 03/28/16   Tresa Garter, MD   Family History Family History  Problem Relation Age of Onset  . Stroke Mother   . Stroke Father    Social History Social History  Substance Use Topics  . Smoking status: Current Every Day Smoker  Packs/day: 0.25    Last attempt to quit: 03/23/2015  . Smokeless tobacco: Never Used  . Alcohol use No   Allergies   Patient has no known allergies.  Review of Systems Review of Systems  Constitutional: Negative for chills, diaphoresis and fever.  Respiratory: Positive for chest tightness. Negative for shortness of breath.   Cardiovascular: Positive for chest pain. Negative for leg swelling.  Gastrointestinal: Negative for nausea.  Neurological: Negative for dizziness and weakness.  All other systems reviewed and  are negative.   Physical Exam Updated Vital Signs BP 114/84 (BP Location: Right Arm)   Pulse (!) 56   Temp 98.2 F (36.8 C) (Oral)   Resp 16   Ht 6' (1.829 m)   Wt 217 lb (98.4 kg)   SpO2 94%   BMI 29.43 kg/m   Physical Exam CONSTITUTIONAL: Well developed/well nourished HEAD: Normocephalic/atraumatic EYES: EOMI/PERRL ENMT: Mucous membranes moist NECK: supple no meningeal signs SPINE/BACK:entire spine nontender CV: S1/S2 noted, no murmurs/rubs/gallops noted LUNGS: Lungs are clear to auscultation bilaterally, no apparent distress ABDOMEN: soft, nontender, no rebound or guarding, bowel sounds noted throughout abdomen GU:no cva tenderness NEURO: Pt is awake/alert/appropriate, moves all extremitiesx4.  No facial droop.   EXTREMITIES: pulses normal/equal, full ROM SKIN: warm, color normal PSYCH: no abnormalities of mood noted, alert and oriented to situation   ED Treatments / Results  DIAGNOSTIC STUDIES: Oxygen Saturation is 94% on RA, low by my interpretation.   COORDINATION OF CARE: 3:22 AM-Discussed next steps with pt. Pt verbalized understanding and is agreeable with the plan.   Labs (all labs ordered are listed, but only abnormal results are displayed) Labs Reviewed  BASIC METABOLIC PANEL - Abnormal; Notable for the following:       Result Value   Potassium 3.2 (*)    Chloride 100 (*)    All other components within normal limits  CBC  I-STAT TROPONIN, ED  I-STAT TROPONIN, ED    EKG  EKG Interpretation  Date/Time:  Sunday October 14 2016 01:27:44 EDT Ventricular Rate:  57 PR Interval:  202 QRS Duration: 106 QT Interval:  456 QTC Calculation: 443 R Axis:   -11 Text Interpretation:  Sinus bradycardia Left ventricular hypertrophy Abnormal ECG No previous ECGs available Confirmed by Ripley Fraise [phone]) on 10/14/2016 3:11:46 AM       Radiology Dg Chest 2 View  Result Date: 10/14/2016 CLINICAL DATA:  51 y/o  M; mid chest pain. EXAM: CHEST  2 VIEW  COMPARISON:  09/08/2012 chest radiograph FINDINGS: Stable normal cardiac silhouette given projection and technique. Clear lungs. No pleural effusion or pneumothorax. Stable T7 and T11 vertebral body compression deformities. IMPRESSION: No acute pulmonary process identified. Stable T7 and T11 vertebral body compression deformities. Electronically Signed   By: Kristine Garbe M.D.   On: 10/14/2016 02:26   Procedures Procedures (including critical care time)  Medications Ordered in ED Medications - No data to display   Initial Impression / Assessment and Plan / ED Course  I have reviewed the triage vital signs and the nursing notes.  Pertinent labs & imaging results that were available during my care of the patient were reviewed by me and considered in my medical decision making (see chart for details).     I had a strong suspicion for ACS in this patient due to age/risk factors and medical history.  He has family h/o "heart issues" pt is a smoker and has HLP/HTN His EKG was abnormal though no old to compare His troponin  was not above threshold but it did begin to escalate on repeat testing Multiple discussions were had with patient and I advised admission and high risk for MI or death Discussed with patient and brother at bedside (he gave permission to speak to family) Pt refuses to be admitted He will continue ASA at home Advised to quit smoking Advised him to f/u with PCP as soon as possible  I discussed risk of death/disability of leaving against medical advice and the patient accepts these risks.  The patient is awake/alert able to make decisions, and does not appear intoxicated Patient discharged against medical advice.    Final Clinical Impressions(s) / ED Diagnoses   Final diagnoses:  Precordial pain    New Prescriptions New Prescriptions   No medications on file  I personally performed the services described in this documentation, which was scribed in my  presence. The recorded information has been reviewed and is accurate.        Ripley Fraise, MD 10/14/16 [phone]

## 2016-11-03 ENCOUNTER — Encounter (HOSPITAL_COMMUNITY): Payer: Self-pay | Admitting: Emergency Medicine

## 2016-11-03 ENCOUNTER — Emergency Department (HOSPITAL_COMMUNITY)
Admission: EM | Admit: 2016-11-03 | Discharge: 2016-11-03 | Disposition: A | Discharge status: Home / Self Care | Payer: No Typology Code available for payment source | Attending: Emergency Medicine | Admitting: Emergency Medicine

## 2016-11-03 DIAGNOSIS — I10 Essential (primary) hypertension: Secondary | ICD-10-CM | POA: Insufficient documentation

## 2016-11-03 DIAGNOSIS — L0231 Cutaneous abscess of buttock: Secondary | ICD-10-CM | POA: Insufficient documentation

## 2016-11-03 DIAGNOSIS — Z79899 Other long term (current) drug therapy: Secondary | ICD-10-CM | POA: Insufficient documentation

## 2016-11-03 DIAGNOSIS — F17210 Nicotine dependence, cigarettes, uncomplicated: Secondary | ICD-10-CM | POA: Insufficient documentation

## 2016-11-03 MED ORDER — DOXYCYCLINE HYCLATE 100 MG PO CAPS: 100.0000 mg | ORAL_CAPSULE | Freq: Two times a day (BID) | ORAL | 0 refills | Status: DC

## 2016-11-03 MED ORDER — LIDOCAINE-EPINEPHRINE (PF) 2 %-1:200000 IJ SOLN
10.0000 mL | Freq: Once | INTRAMUSCULAR | Status: AC
  Administered 2016-11-03: 10 mL via INTRADERMAL
  Filled 2016-11-03: qty 20

## 2016-11-03 NOTE — Discharge Instructions (Signed)
Doxycycline as prescribed until all gone. Ibuprofen or tylenol for pain. Warm compresses several times a day. Follow up with family doctor or return here in two days for packing removal and recheck.

## 2016-11-03 NOTE — ED Provider Notes (Signed)
[facility] DEPT Provider Note   CSN: [id number] Arrival date & time: 11/03/16  2009  By signing my name below, I, Ny'Kea Lewis, attest that this documentation has been prepared under the direction and in the presence of Geraldyne Barraclough, PA-C. Electronically Signed: Lise Auer, ED Scribe. 11/03/16. 8:56 PM.  History   Chief Complaint Chief Complaint  Patient presents with  . Abscess    buttocks   HPI  HPI Comments: Corey Hale is a 51 y.o. male  With no pertinent history, who presents to the Emergency Department complaining of a moderate, gradually worsening area of pain and swelling to the buttock onset one week ago. He notes associated redness, subjective fever, and chills. Pt endorses a history of recurrent abscesses. Six years ago he states he had to get an I&D. Pt states he had the same issue one month ago that he treated with Bactrim, which he states resolved. Pt states his pain is exacerbated with palpation and direct pressure. He denies drainage from the area.   Past Medical History:  Diagnosis Date  . Chronic back pain   . Hypercholesteremia   . Hypertension   . Thyroid disease    Patient Active Problem List   Diagnosis Date Noted  . Herpes exposure 03/28/2016  . Chronic back pain 05/30/2015  . Mid back pain, chronic 05/26/2015  . Chronic midline low back pain without sciatica 05/26/2015  . Midline thoracic back pain 03/07/2015  . Back pain at L4-L5 level 07/06/2014  . History of vertebral fracture 05/20/2014  . Essential hypertension 02/23/2014  . 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 Hearing loss 09/07/2013  . Dental caries 06/10/2012  . Erectile dysfunction 06/10/2012  . Tobacco abuse 06/10/2012  . Dyslipidemia 06/10/2012  . Personal history of goiter 06/10/2012  . Genital herpes 03/05/2012  . Thoracic back pain 07/21/2009  . THYROID NODULE, HX OF 07/21/2009   Past Surgical History:  Procedure Laterality Date  . ARM HARDWARE REMOVAL Right   . NECK SURGERY       Home Medications    Prior to Admission medications   Medication Sig Start Date End Date Taking? Authorizing Provider  aspirin 81 MG tablet Take 1 tablet (81 mg total) by mouth daily. 03/07/15   Tresa Garter, MD  atenolol-chlorthalidone (TENORETIC) 50-25 MG tablet Take 1 tablet by mouth daily. 06/27/16   Tresa Garter, MD  carbamide peroxide (DEBROX) 6.5 % otic solution Place 5 drops into both ears 2 (two) times daily. Patient not taking: Reported on 10/14/2016 03/29/16   Tresa Garter, MD  pravastatin (PRAVACHOL) 20 MG tablet Take 0.5 tablets (10 mg total) by mouth daily. 07/18/16   Tresa Garter, MD   Family History Family History  Problem Relation Age of Onset  . Stroke Mother   . Stroke Father    Social History Social History  Substance Use Topics  . Smoking status: Current Every Day Smoker    Packs/day: 0.25    Last attempt to quit: 03/23/2015  . Smokeless tobacco: Never Used  . Alcohol use No    Allergies   Patient has no known allergies.   Review of Systems Review of Systems  Constitutional: Positive for chills and fever.  Skin: Positive for wound.   Physical Exam Updated Vital Signs BP 117/80 (BP Location: Right Arm)   Pulse 93   Temp 100 F (37.8 C) (Oral)   Resp 18   Ht 6' (1.829 m)   Wt 217 lb (98.4 kg)  SpO2 96%   BMI 29.43 kg/m   Physical Exam  Constitutional: He is oriented to person, place, and time. He appears well-developed and well-nourished.  HENT:  Head: Normocephalic.  Eyes: EOM are normal.  Neck: Normal range of motion.  Pulmonary/Chest: Effort normal.  Abdominal: He exhibits no distension.  Musculoskeletal: Normal range of motion.  Neurological: He is alert and oriented to person, place, and time.  Skin:     4 x 4 centimeter area of induration, erythema, fluctuance to the left buttock, with surrounding cellulitis. Tender to palpation. No drainage  Psychiatric: He has a normal mood and affect.  Nursing  note and vitals reviewed.   ED Treatments / Results  DIAGNOSTIC STUDIES: Oxygen Saturation is 96% on RA, adequate by my interpretation.   COORDINATION OF CARE: 8:51 PM-Discussed next steps with pt. Pt verbalized understanding and is agreeable with the plan.   Labs (all labs ordered are listed, but only abnormal results are displayed) Labs Reviewed - No data to display  EKG  EKG Interpretation None       Radiology No results found.  Procedures Procedures (including critical care time)  INCISION AND DRAINAGE Performed by: Jeannett Senior A Consent: Verbal consent obtained. Risks and benefits: risks, benefits and alternatives were discussed Type: abscess  Body area: left buttock  Anesthesia: local infiltration  Incision was made with a scalpel.  Local anesthetic: lidocaine 2% w epinephrine  Anesthetic total: 3 ml  Complexity: complex Blunt dissection to break up loculations  Drainage: purulent  Drainage amount: copious  Packing material: 1/4 in iodoform gauze  Patient tolerance: Patient tolerated the procedure well with no immediate complications.    Medications Ordered in ED Medications - No data to display   Initial Impression / Assessment and Plan / ED Course  I have reviewed the triage vital signs and the nursing notes.  Pertinent labs & imaging results that were available during my care of the patient were reviewed by me and considered in my medical decision making (see chart for details).     Patient with a large buttock abscess, fever, otherwise no complaints. Abscess was incised and drained. Patient is not diabetic. He does have a low-grade temperature emergency department of 100. His vital signs otherwise are normal. No concern for sepsis at this time. Copious drainage obtained from the abscess. It is irrigated and packed. We'll discharge home with doxycycline. Will have him come back in 2 days for recheck given the size of an abscess.  Sooner return precautions discussed.   Vitals:   11/03/16 2015 11/03/16 2016  BP: 117/80   Pulse: 93   Resp: 18   Temp: 100 F (37.8 C)   TempSrc: Oral   SpO2: 96%   Weight:  98.4 kg (217 lb)  Height:  6' (1.829 m)     Final Clinical Impressions(s) / ED Diagnoses   Final diagnoses:  Abscess of buttock, left    New Prescriptions New Prescriptions   DOXYCYCLINE (VIBRAMYCIN) 100 MG CAPSULE    Take 1 capsule (100 mg total) by mouth 2 (two) times daily.     Jeannett Senior, PA-C 11/03/16 2209    Merrily Pew, MD 11/03/16 [phone]

## 2016-11-03 NOTE — ED Triage Notes (Signed)
Pt presents with abscess to buttock area that is described as large and hard; denies drainage; reports subjective fevers; pt states hx of same and had it lanced 6 yrs ago; pt also reports friends with MD who mailed him bactrim which he completed but never went away

## 2016-11-05 ENCOUNTER — Emergency Department (HOSPITAL_COMMUNITY)
Admission: EM | Admit: 2016-11-05 | Discharge: 2016-11-05 | Disposition: A | Discharge status: Home / Self Care | Payer: No Typology Code available for payment source | Attending: Emergency Medicine | Admitting: Emergency Medicine

## 2016-11-05 ENCOUNTER — Encounter (HOSPITAL_COMMUNITY): Payer: Self-pay | Admitting: Nurse Practitioner

## 2016-11-05 DIAGNOSIS — F17210 Nicotine dependence, cigarettes, uncomplicated: Secondary | ICD-10-CM | POA: Insufficient documentation

## 2016-11-05 DIAGNOSIS — Z7902 Long term (current) use of antithrombotics/antiplatelets: Secondary | ICD-10-CM | POA: Insufficient documentation

## 2016-11-05 DIAGNOSIS — Z7982 Long term (current) use of aspirin: Secondary | ICD-10-CM | POA: Insufficient documentation

## 2016-11-05 DIAGNOSIS — L0231 Cutaneous abscess of buttock: Secondary | ICD-10-CM | POA: Insufficient documentation

## 2016-11-05 DIAGNOSIS — I10 Essential (primary) hypertension: Secondary | ICD-10-CM | POA: Insufficient documentation

## 2016-11-05 DIAGNOSIS — Z09 Encounter for follow-up examination after completed treatment for conditions other than malignant neoplasm: Secondary | ICD-10-CM | POA: Insufficient documentation

## 2016-11-05 MED FILL — ATENOLOL/CHLORTHAL 50/25: 50-25 | 30 days supply | Qty: 30 | Fill #0

## 2016-11-05 MED FILL — PRAVASTATIN NA 20 MG TAB: 20 | 30 days supply | Qty: 15 | Fill #0

## 2016-11-05 MED FILL — DOXYCYCLINE 100 MG TABLET: 100 | 10 days supply | Qty: 20 | Fill #0

## 2016-11-05 NOTE — ED Provider Notes (Signed)
[facility] DEPT Provider Note   CSN: [id number] Arrival date & time: 11/05/16  1502     History   Chief Complaint Chief Complaint  Patient presents with  . Wound Check    HPI Corey Hale is a 51 y.o. male.  Patient presents for recheck of abscess to left buttock. I&D was performed on 11/03/16 with packing place in wound.  Patient reports packing "fell out" on its own yesterday.  No current complaint of pain or discomfort.    The history is provided by the patient and medical records. No language interpreter was used.  Wound Check     Past Medical History:  Diagnosis Date  . Chronic back pain   . Hypercholesteremia   . Hypertension   . Thyroid disease     Patient Active Problem List   Diagnosis Date Noted  . Herpes exposure 03/28/2016  . Chronic back pain 05/30/2015  . Mid back pain, chronic 05/26/2015  . Chronic midline low back pain without sciatica 05/26/2015  . Midline thoracic back pain 03/07/2015  . Back pain at L4-L5 level 07/06/2014  . History of vertebral fracture 05/20/2014  . Essential hypertension 02/23/2014  . 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 Hearing loss 09/07/2013  . Dental caries 06/10/2012  . Erectile dysfunction 06/10/2012  . Tobacco abuse 06/10/2012  . Dyslipidemia 06/10/2012  . Personal history of goiter 06/10/2012  . Genital herpes 03/05/2012  . Thoracic back pain 07/21/2009  . THYROID NODULE, HX OF 07/21/2009    Past Surgical History:  Procedure Laterality Date  . ARM HARDWARE REMOVAL Right   . NECK SURGERY         Home Medications    Prior to Admission medications   Medication Sig Start Date End Date Taking? Authorizing Provider  aspirin 81 MG tablet Take 1 tablet (81 mg total) by mouth daily. 03/07/15   Tresa Garter, MD  atenolol-chlorthalidone (TENORETIC) 50-25 MG tablet Take 1 tablet by mouth daily. 06/27/16   Tresa Garter, MD  carbamide peroxide (DEBROX) 6.5 % otic solution Place 5 drops into both ears 2  (two) times daily. Patient not taking: Reported on 10/14/2016 03/29/16   Tresa Garter, MD  doxycycline (VIBRAMYCIN) 100 MG capsule Take 1 capsule (100 mg total) by mouth 2 (two) times daily. 11/03/16   Kirichenko, Tatyana, PA-C  pravastatin (PRAVACHOL) 20 MG tablet Take 0.5 tablets (10 mg total) by mouth daily. 07/18/16   Tresa Garter, MD    Family History Family History  Problem Relation Age of Onset  . Stroke Mother   . Stroke Father     Social History Social History  Substance Use Topics  . Smoking status: Current Every Day Smoker    Packs/day: 0.25    Last attempt to quit: 03/23/2015  . Smokeless tobacco: Never Used  . Alcohol use No     Allergies   Patient has no known allergies.   Review of Systems Review of Systems  Constitutional: Negative for fever.  Gastrointestinal: Negative for rectal pain.  Skin: Positive for wound.  All other systems reviewed and are negative.    Physical Exam Updated Vital Signs BP (!) 124/95   Pulse 71   Temp 98.1 F (36.7 C) (Oral)   Resp 16   SpO2 98%   Physical Exam  Constitutional: He is oriented to person, place, and time. He appears well-developed and well-nourished. No distress.  HENT:  Head: Normocephalic.  Eyes: Conjunctivae are normal.  Neck: Neck supple.  Cardiovascular: Normal rate and regular rhythm.   Pulmonary/Chest: Effort normal and breath sounds normal.  Abdominal: Soft.  Genitourinary:  Genitourinary Comments: 2x2 cm area of redness, no induration, healing incision noted. Small amount of yellowish drainage.  Musculoskeletal: Normal range of motion. He exhibits no edema.  Neurological: He is alert and oriented to person, place, and time.  Skin: Skin is warm and dry.  Psychiatric: He has a normal mood and affect.  Nursing note and vitals reviewed.    ED Treatments / Results  Labs (all labs ordered are listed, but only abnormal results are displayed) Labs Reviewed - No data to  display  EKG  EKG Interpretation None       Radiology No results found.  Procedures Procedures (including critical care time)  Medications Ordered in ED Medications - No data to display   Initial Impression / Assessment and Plan / ED Course  I have reviewed the triage vital signs and the nursing notes.  Pertinent labs & imaging results that were available during my care of the patient were reviewed by me and considered in my medical decision making (see chart for details).     Patient returns for check of recent incision and drainage. The region appears to be well-healing and infection appears to be resolving. Patient symptoms improved from prior visit. Afebrile and hemodynamically stable. Pt is instructed to continue with home care or antibiotics. Pt has a good understanding of return precautions and is safe for discharge at this time.  Final Clinical Impressions(s) / ED Diagnoses   Final diagnoses:  None    New Prescriptions New Prescriptions   No medications on file     Etta Quill, NP 11/05/16 1851    Virgel Manifold, MD 11/13/16 1630

## 2016-11-05 NOTE — Discharge Instructions (Signed)
Please fill the antibiotic prescription and take as directed.

## 2016-11-05 NOTE — ED Triage Notes (Signed)
Pt presents with c/o wound check. He had an abscess to left buttock drained and packed here on 8/17 and was told to return in 2 days for a recheck. He says the abcess seems to be healing fine and and he does not have pain or other complaints. He was given a prescription for doxycycline but did not get it filled yet.

## 2016-11-05 NOTE — ED Notes (Signed)
Pt reports he is here to have packing removed that was placed Wednesday.

## 2016-11-28 ENCOUNTER — Ambulatory Visit: Payer: No Typology Code available for payment source

## 2016-12-10 MED FILL — PRAVASTATIN NA 20 MG TAB: 20 | 30 days supply | Qty: 15 | Fill #1

## 2016-12-10 MED FILL — valACYclovir HCL 1 GM TABS: 1 | 30 days supply | Qty: 30 | Fill #1

## 2016-12-10 MED FILL — ATENOLOL/CHLORTHAL 50/25: 50-25 | 30 days supply | Qty: 30 | Fill #1

## 2016-12-19 ENCOUNTER — Ambulatory Visit: Payer: Medicaid Other

## 2017-01-07 MED FILL — PRAVASTATIN NA 20 MG TAB: 20 | 30 days supply | Qty: 15 | Fill #2

## 2017-01-07 MED FILL — ?VALACYCLOVIR HCL 1 GRAM TA: 1 | 30 days supply | Qty: 30 | Fill #2

## 2017-01-07 MED FILL — ATENOLOL/CHLORTHAL 50/25: 50-25 | 30 days supply | Qty: 30 | Fill #2

## 2017-01-21 ENCOUNTER — Ambulatory Visit (HOSPITAL_COMMUNITY)
Admission: EM | Admit: 2017-01-21 | Discharge: 2017-01-21 | Disposition: A | Discharge status: Home / Self Care | Payer: Self-pay | Attending: Family Medicine | Admitting: Family Medicine

## 2017-01-21 ENCOUNTER — Encounter (HOSPITAL_COMMUNITY): Payer: Self-pay | Admitting: Emergency Medicine

## 2017-01-21 DIAGNOSIS — L03032 Cellulitis of left toe: Secondary | ICD-10-CM

## 2017-01-21 MED ORDER — SULFAMETHOXAZOLE-TRIMETHOPRIM 800-160 MG PO TABS: 1.0000 | ORAL_TABLET | Freq: Two times a day (BID) | ORAL | 0 refills | Status: DC

## 2017-01-21 MED ORDER — TRIAMCINOLONE ACETONIDE 0.1 % EX CREA: 1.0000 "application " | TOPICAL_CREAM | Freq: Two times a day (BID) | CUTANEOUS | 0 refills | Status: DC

## 2017-01-21 MED FILL — ?TRIAMCINOLONE 0.1% CRM: 0.1 | 14 days supply | Qty: 30 | Fill #0

## 2017-01-21 MED FILL — SULFAMETHOXAZOLE-TMP DS TAB: 800-160 | 10 days supply | Qty: 20 | Fill #0

## 2017-01-21 NOTE — ED Triage Notes (Signed)
Pt states he noticed a fungus between his second and third left toes over a week ago.  He now has pain on the underside of the third toe.

## 2017-01-23 NOTE — ED Provider Notes (Signed)
  [facility]   [id number] 01/21/17 Arrival Time: 7824  ASSESSMENT & PLAN:  1. Cellulitis of toe of left foot     Meds ordered this encounter  Medications  . sulfamethoxazole-trimethoprim (BACTRIM DS,SEPTRA DS) 800-160 MG tablet    Sig: Take 1 tablet 2 (two) times daily by mouth.    Dispense:  20 tablet    Refill:  0  . triamcinolone cream (KENALOG) 0.1 %    Sig: Apply 1 application 2 (two) times daily topically.    Dispense:  30 g    Refill:  0   Will f/u 48-72 hours if not showing improvement, sooner if needed. Reviewed expectations re: course of current medical issues. Questions answered. Outlined signs and symptoms indicating need for more acute intervention. Patient verbalized understanding. After Visit Summary given.   SUBJECTIVE:  Corey Hale is a 51 y.o. male who presents with complaint of possible skin infection under toes of L foot. Fungus approx one week ago. Treated. Now feeling some discomfort and has noticed increasing redness under third L toe. Afebrile. Ambulatory.  Able to wear his normal shoes. No self treatment. No specific aggravating or alleviating factors reported.  ROS: As per HPI.   OBJECTIVE:  Vitals:   01/21/17 1409  BP: 118/78  Pulse: (!) 58  Temp: 98.3 F (36.8 C)  TempSrc: Oral  SpO2: 98%    General appearance: alert; no distress Extremities: no cyanosis or edema; symmetrical with no gross deformities Skin: warm and dry; under 3rd left toe there is a small area of erythema, approx 1.5 cm and warm to touch Neurologic: normal gait; normal symmetric reflexes Psychological: alert and cooperative; normal mood and affect  No Known Allergies  Past Medical History:  Diagnosis Date  . Chronic back pain   . Hypercholesteremia   . Hypertension   . Thyroid disease    Social History   Socioeconomic History  . Marital status: Married    Spouse name: Not on file  . Number of children: Not on file  . Years of education: Not on  file  . Highest education level: Not on file  Social Needs  . Financial resource strain: Not on file  . Food insecurity - worry: Not on file  . Food insecurity - inability: Not on file  . Transportation needs - medical: Not on file  . Transportation needs - non-medical: Not on file  Occupational History  . Not on file  Tobacco Use  . Smoking status: Current Every Day Smoker    Packs/day: 0.25    Last attempt to quit: 03/23/2015    Years since quitting: 1.8  . Smokeless tobacco: Never Used  Substance and Sexual Activity  . Alcohol use: No    Alcohol/week: 0.0 oz  . Drug use: No  . Sexual activity: Not on file  Other Topics Concern  . Not on file  Social History Narrative   ** Merged History Encounter **       Family History  Problem Relation Age of Onset  . Stroke Mother   . Stroke Father    Past Surgical History:  Procedure Laterality Date  . ARM HARDWARE REMOVAL Right   . NECK SURGERY       Vanessa Kick, MD 01/23/17 [phone]

## 2017-02-11 MED FILL — ATENOLOL/CHLORTHAL 50/25: 50-25 | 30 days supply | Qty: 30 | Fill #3

## 2017-02-11 MED FILL — PRAVASTATIN NA 20 MG TAB: 20 | 30 days supply | Qty: 15 | Fill #3

## 2017-02-11 MED FILL — ?VALACYCLOVIR HCL 1 GRAM TA: 1 | 30 days supply | Qty: 30 | Fill #3

## 2017-03-13 MED FILL — ATENOLOL/CHLORTHAL 50/25: 50-25 | 30 days supply | Qty: 30 | Fill #4

## 2017-03-13 MED FILL — ?VALACYCLOVIR HCL 1 GRAM TA: 1 | 30 days supply | Qty: 30 | Fill #4

## 2017-03-13 MED FILL — PRAVASTATIN NA 20 MG TAB: 20 | 30 days supply | Qty: 15 | Fill #4

## 2017-04-04 ENCOUNTER — Emergency Department (HOSPITAL_COMMUNITY)
Admission: EM | Admit: 2017-04-04 | Discharge: 2017-04-04 | Disposition: A | Discharge status: Home / Self Care | Payer: Medicaid Other | Attending: Emergency Medicine | Admitting: Emergency Medicine

## 2017-04-04 ENCOUNTER — Other Ambulatory Visit: Payer: Self-pay

## 2017-04-04 ENCOUNTER — Encounter (HOSPITAL_COMMUNITY): Payer: Self-pay | Admitting: Emergency Medicine

## 2017-04-04 DIAGNOSIS — Z79899 Other long term (current) drug therapy: Secondary | ICD-10-CM | POA: Insufficient documentation

## 2017-04-04 DIAGNOSIS — Z7982 Long term (current) use of aspirin: Secondary | ICD-10-CM | POA: Insufficient documentation

## 2017-04-04 DIAGNOSIS — F17210 Nicotine dependence, cigarettes, uncomplicated: Secondary | ICD-10-CM | POA: Insufficient documentation

## 2017-04-04 DIAGNOSIS — B359 Dermatophytosis, unspecified: Secondary | ICD-10-CM

## 2017-04-04 DIAGNOSIS — B354 Tinea corporis: Secondary | ICD-10-CM | POA: Insufficient documentation

## 2017-04-04 DIAGNOSIS — I10 Essential (primary) hypertension: Secondary | ICD-10-CM | POA: Insufficient documentation

## 2017-04-04 DIAGNOSIS — E079 Disorder of thyroid, unspecified: Secondary | ICD-10-CM | POA: Insufficient documentation

## 2017-04-04 MED ORDER — TRIAMCINOLONE ACETONIDE 0.1 % EX CREA: 1.0000 "application " | TOPICAL_CREAM | Freq: Two times a day (BID) | CUTANEOUS | 1 refills | Status: DC

## 2017-04-04 NOTE — ED Notes (Signed)
Called for pt no answer

## 2017-04-04 NOTE — ED Notes (Signed)
Call for room, no answer

## 2017-04-04 NOTE — ED Notes (Signed)
Called pt to bring back to [facility], no answer

## 2017-04-04 NOTE — ED Provider Notes (Signed)
[facility] EMERGENCY DEPARTMENT Provider Note   CSN: [id number] Arrival date & time: 04/04/17  1500     History   Chief Complaint Chief Complaint  Patient presents with  . Recurrent Skin Infections    HPI Corey Hale is a 52 y.o. male.  The history is provided by the patient and medical records. No language interpreter was used.    Corey Hale is a 52 y.o. male who presents to the Emergency Department complaining of skin lesion to the left 2-4th toes x 3 weeks. Associated with a burning sensation that gets worse with pressure. He has been using bactroban at home but has not noticed any improvement. Treated for cellulitis of the left toe back in November to this same area. At that point, the toes were very red and tender. This does not feel similar. No fever, chills. No numbness, weakness.   Past Medical History:  Diagnosis Date  . Chronic back pain   . Hypercholesteremia   . Hypertension   . Thyroid disease     Patient Active Problem List   Diagnosis Date Noted  . Herpes exposure 03/28/2016  . Chronic back pain 05/30/2015  . Mid back pain, chronic 05/26/2015  . Chronic midline low back pain without sciatica 05/26/2015  . Midline thoracic back pain 03/07/2015  . Back pain at L4-L5 level 07/06/2014  . History of vertebral fracture 05/20/2014  . Essential hypertension 02/23/2014  . 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 Hearing loss 09/07/2013  . Dental caries 06/10/2012  . Erectile dysfunction 06/10/2012  . Tobacco abuse 06/10/2012  . Dyslipidemia 06/10/2012  . Personal history of goiter 06/10/2012  . Genital herpes 03/05/2012  . Thoracic back pain 07/21/2009  . THYROID NODULE, HX OF 07/21/2009    Past Surgical History:  Procedure Laterality Date  . ARM HARDWARE REMOVAL Right   . NECK SURGERY         Home Medications    Prior to Admission medications   Medication Sig Start Date End Date Taking? Authorizing Provider  aspirin 81 MG tablet  Take 1 tablet (81 mg total) by mouth daily. 03/07/15   Tresa Garter, MD  atenolol-chlorthalidone (TENORETIC) 50-25 MG tablet Take 1 tablet by mouth daily. 06/27/16   Tresa Garter, MD  carbamide peroxide (DEBROX) 6.5 % otic solution Place 5 drops into both ears 2 (two) times daily. Patient not taking: Reported on 10/14/2016 03/29/16   Tresa Garter, MD  doxycycline (VIBRAMYCIN) 100 MG capsule Take 1 capsule (100 mg total) by mouth 2 (two) times daily. 11/03/16   Kirichenko, Tatyana, PA-C  pravastatin (PRAVACHOL) 20 MG tablet Take 0.5 tablets (10 mg total) by mouth daily. 07/18/16   Tresa Garter, MD  sulfamethoxazole-trimethoprim (BACTRIM DS,SEPTRA DS) 800-160 MG tablet Take 1 tablet 2 (two) times daily by mouth. 01/21/17   Vanessa Kick, MD  triamcinolone cream (KENALOG) 0.1 % Apply 1 application topically 2 (two) times daily. 04/04/17   Mars Scheaffer, Ozella Almond, PA-C    Family History Family History  Problem Relation Age of Onset  . Stroke Mother   . Stroke Father     Social History Social History   Tobacco Use  . Smoking status: Current Every Day Smoker    Packs/day: 0.25    Last attempt to quit: 03/23/2015    Years since quitting: 2.0  . Smokeless tobacco: Never Used  Substance Use Topics  . Alcohol use: No    Alcohol/week: 0.0 oz  . Drug  use: No     Allergies   Patient has no known allergies.   Review of Systems Review of Systems  Constitutional: Negative for fever.  Musculoskeletal: Negative for arthralgias and myalgias.  Skin: Positive for color change.  Neurological: Negative for weakness and numbness.     Physical Exam Updated Vital Signs BP 127/87 (BP Location: Right Arm)   Pulse 79   Temp 98.3 F (36.8 C) (Oral)   Resp 16   SpO2 98%   Physical Exam  Constitutional: He appears well-developed and well-nourished. No distress.  HENT:  Head: Normocephalic and atraumatic.  Neck: Neck supple.  Cardiovascular: Normal rate, regular rhythm  and normal heart sounds.  No murmur heard. Pulmonary/Chest: Effort normal and breath sounds normal. No respiratory distress. He has no wheezes. He has no rales.  Musculoskeletal: Normal range of motion.  Neurological: He is alert.  Skin: Skin is warm and dry.  Crusting, light yellow under plantar surface of 3rd-4th left toes. No erythema or warmth. Good cap refill. 2+ DP. Sensation intact.   Nursing note and vitals reviewed.    ED Treatments / Results  Labs (all labs ordered are listed, but only abnormal results are displayed) Labs Reviewed - No data to display  EKG  EKG Interpretation None       Radiology No results found.  Procedures Procedures (including critical care time)  Medications Ordered in ED Medications - No data to display   Initial Impression / Assessment and Plan / ED Course  I have reviewed the triage vital signs and the nursing notes.  Pertinent labs & imaging results that were available during my care of the patient were reviewed by me and considered in my medical decision making (see chart for details).    Corey Hale is a 52 y.o. male who presents to ED for skin lesion to left toes c/w tinea. No signs of superimposed infection. Will treat with kenalog and have patient follow up with PCP. Return precautions discussed. Home care instructions discussed. All questions answered.   Final Clinical Impressions(s) / ED Diagnoses   Final diagnoses:  Tinea    ED Discharge Orders        Ordered    triamcinolone cream (KENALOG) 0.1 %  2 times daily     04/04/17 1805       Sherena Machorro, Ozella Almond, PA-C 04/04/17 1829    Noemi Chapel, MD 04/05/17 1454

## 2017-04-04 NOTE — ED Notes (Signed)
Called for room x2, no answer.  

## 2017-04-04 NOTE — Discharge Instructions (Signed)
It was my pleasure taking care of you today!   Keep feet dry. Apply cream to the toes twice daily.   Follow up with your primary care doctor in the next 2-3 weeks for recheck.   Return to ER for new or worsening symptoms, any additional concerns.

## 2017-04-04 NOTE — ED Triage Notes (Signed)
Pt reports itching and peeling to area under toes on L foot ongoing for weeks.

## 2017-04-05 MED FILL — ?TRIAMCINOLONE 0.1% CRM: 0.1 | 15 days supply | Qty: 30 | Fill #0

## 2017-04-10 ENCOUNTER — Ambulatory Visit: Payer: Self-pay | Attending: Internal Medicine | Admitting: Internal Medicine

## 2017-04-10 ENCOUNTER — Encounter: Payer: Self-pay | Admitting: Internal Medicine

## 2017-04-10 VITALS — BP 112/77 | HR 66 | Temp 98.2°F | Resp 18 | Ht 72.0 in | Wt 205.8 lb

## 2017-04-10 DIAGNOSIS — E785 Hyperlipidemia, unspecified: Secondary | ICD-10-CM | POA: Insufficient documentation

## 2017-04-10 DIAGNOSIS — Z7982 Long term (current) use of aspirin: Secondary | ICD-10-CM | POA: Insufficient documentation

## 2017-04-10 DIAGNOSIS — M545 Low back pain: Secondary | ICD-10-CM | POA: Insufficient documentation

## 2017-04-10 DIAGNOSIS — M25562 Pain in left knee: Secondary | ICD-10-CM | POA: Insufficient documentation

## 2017-04-10 DIAGNOSIS — E079 Disorder of thyroid, unspecified: Secondary | ICD-10-CM | POA: Insufficient documentation

## 2017-04-10 DIAGNOSIS — E7800 Pure hypercholesterolemia, unspecified: Secondary | ICD-10-CM | POA: Insufficient documentation

## 2017-04-10 DIAGNOSIS — G8929 Other chronic pain: Secondary | ICD-10-CM | POA: Insufficient documentation

## 2017-04-10 DIAGNOSIS — I10 Essential (primary) hypertension: Secondary | ICD-10-CM | POA: Insufficient documentation

## 2017-04-10 DIAGNOSIS — H906 Mixed conductive and sensorineural hearing loss, bilateral: Secondary | ICD-10-CM | POA: Insufficient documentation

## 2017-04-10 DIAGNOSIS — Z79899 Other long term (current) drug therapy: Secondary | ICD-10-CM | POA: Insufficient documentation

## 2017-04-10 DIAGNOSIS — H833X3 Noise effects on inner ear, bilateral: Secondary | ICD-10-CM

## 2017-04-10 MED ORDER — PRAVASTATIN SODIUM 20 MG PO TABS: 10.0000 mg | ORAL_TABLET | Freq: Every day | ORAL | 2 refills | Status: DC

## 2017-04-10 MED ORDER — CARBAMIDE PEROXIDE 6.5 % OT SOLN: 5.0000 [drp] | Freq: Two times a day (BID) | OTIC | 0 refills | Status: DC

## 2017-04-10 MED ORDER — ATENOLOL-CHLORTHALIDONE 50-25 MG PO TABS: 1.0000 | ORAL_TABLET | Freq: Every day | ORAL | 3 refills | Status: DC

## 2017-04-10 MED ORDER — SILDENAFIL CITRATE 100 MG PO TABS: 50.0000 mg | ORAL_TABLET | Freq: Every day | ORAL | 3 refills | Status: DC | PRN

## 2017-04-10 MED FILL — TRIAMCINOLONE ACETONIDE 0.1: 0.1 | 15 days supply | Qty: 30 | Fill #1

## 2017-04-10 MED FILL — !VIAGRA 100MG TABLET: 100 | 30 days supply | Qty: 10 | Fill #0

## 2017-04-10 MED FILL — PRAVASTATIN NA 20 MG TAB: 20 | 30 days supply | Qty: 15 | Fill #0

## 2017-04-10 MED FILL — ATENOLOL/CHLORTHAL 50/25: 50-25 | 30 days supply | Qty: 30 | Fill #0

## 2017-04-10 NOTE — Progress Notes (Signed)
Corey Hale, is a 52 y.o. male  ZOX:[id number]  [id number]:[id number]  DOB - 1965/04/24  Chief Complaint  Patient presents with  . Recurrent Skin Infections       Subjective:   Corey Hale is a 52 y.o. male with history of chronic low back pain, hypertension, hyperlipidemia and chronic herpes infection who presents here today for an urgent care visit. He also needs refill of his medications. His major complaint today is left knee pain that started suddenly few days ago and getting worse. He denies any fall or trauma. He is also complaining of pain both ears and some loss of hearing. He also continues to have low back pain, no loss of sensation, no fecal or urinary incontinence. Patient has No headache, No chest pain, No abdominal pain - No Nausea, No new weakness tingling or numbness, No Cough - SOB.  No problems updated.  ALLERGIES: No Known Allergies  PAST MEDICAL HISTORY: Past Medical History:  Diagnosis Date  . Chronic back pain   . Hypercholesteremia   . Hypertension   . Thyroid disease     MEDICATIONS AT HOME: Prior to Admission medications   Medication Sig Start Date End Date Taking? Authorizing Provider  aspirin 81 MG tablet Take 1 tablet (81 mg total) by mouth daily. 03/07/15  Yes Tresa Garter, MD  atenolol-chlorthalidone (TENORETIC) 50-25 MG tablet Take 1 tablet by mouth daily. 04/10/17  Yes Tresa Garter, MD  pravastatin (PRAVACHOL) 20 MG tablet Take 0.5 tablets (10 mg total) by mouth daily. 04/10/17  Yes Tresa Garter, MD  triamcinolone cream (KENALOG) 0.1 % Apply 1 application topically 2 (two) times daily. 04/04/17  Yes Ward, Ozella Almond, PA-C  carbamide peroxide (DEBROX) 6.5 % OTIC solution Place 5 drops into both ears 2 (two) times daily. 04/10/17   Tresa Garter, MD  sildenafil (VIAGRA) 100 MG tablet Take 0.5-1 tablets (50-100 mg total) by mouth daily as needed for erectile dysfunction. 04/10/17   Tresa Garter, MD    Objective:    Vitals:   04/10/17 1536  BP: 112/77  Pulse: 66  Resp: 18  Temp: 98.2 F (36.8 C)  TempSrc: Oral  SpO2: 98%  Weight: 205 lb 12.8 oz (93.4 kg)  Height: 6' (1.829 m)   Exam General appearance : Awake, alert, not in any distress. Speech Clear. Not toxic looking HEENT: Waxes in both ears, Atraumatic and Normocephalic, pupils equally reactive to light and accomodation Neck: Supple, no JVD. No cervical lymphadenopathy.  Chest: Good air entry bilaterally, no added sounds  CVS: S1 S2 regular, no murmurs.  Abdomen: Bowel sounds present, Non tender and not distended with no gaurding, rigidity or rebound. Extremities: B/L Lower Ext shows no edema, both legs are warm to touch Neurology: Awake alert, and oriented X 3, CN II-XII intact, Non focal Skin: No Rash  Data Review Lab Results  Component Value Date   HGBA1C 5.7 (H) 06/10/2012    Assessment & Plan   1. Essential hypertension  - atenolol-chlorthalidone (TENORETIC) 50-25 MG tablet; Take 1 tablet by mouth daily.  Dispense: 90 tablet; Refill: 3  2. Dyslipidemia  - pravastatin (PRAVACHOL) 20 MG tablet; Take 0.5 tablets (10 mg total) by mouth daily.  Dispense: 30 tablet; Refill: 2  To address this please limit saturated fat to no more than 7% of your calories, limit cholesterol to 200 mg/day, increase fiber and exercise as tolerated. If needed we may add another cholesterol lowering medication to your regimen.   3.  Chronic bilateral low back pain without sciatica  - Patient follows up with Pain Clinic, on Oxycodone 15 mg prn - Continue physical therapy  4. Acute pain of left knee  - DG Knee Complete 4 Views Left; Future  5. Mixed conductive and sensorineural hearing loss of both ears - carbamide peroxide (DEBROX) 6.5 % OTIC solution; Place 5 drops into both ears 2 (two) times daily.  Dispense: 15 mL; Refill: 0 - Ambulatory referral to ENT  6. Noise effect on both inner ears  - carbamide peroxide (DEBROX) 6.5 % OTIC  solution; Place 5 drops into both ears 2 (two) times daily.  Dispense: 15 mL; Refill: 0 - Ambulatory referral to ENT  Patient have been counseled extensively about nutrition and exercise. Other issues discussed during this visit include: low cholesterol diet, weight control and daily exercise, importance of adherence with medications and regular follow-up. We also discussed long term complications of uncontrolled hypertension.   Return in about 6 months (around 10/08/2017) for Routine Follow Up, Follow up HTN.  The patient was given clear instructions to go to ER or return to medical center if symptoms don't improve, worsen or new problems develop. The patient verbalized understanding. The patient was told to call to get lab results if they haven't heard anything in the next week.   This note has been created with Surveyor, quantity. Any transcriptional errors are unintentional.    Angelica Chessman, MD, MHA, Karilyn Cota, [place] [facility] [place], [place]   04/10/2017, 3:53 PM

## 2017-04-10 NOTE — Patient Instructions (Signed)
DASH Eating Plan DASH stands for "Dietary Approaches to Stop Hypertension." The DASH eating plan is a healthy eating plan that has been shown to reduce high blood pressure (hypertension). It may also reduce your risk for type 2 diabetes, heart disease, and stroke. The DASH eating plan may also help with weight loss. What are tips for following this plan? General guidelines  Avoid eating more than 2,300 mg (milligrams) of salt (sodium) a day. If you have hypertension, you may need to reduce your sodium intake to 1,500 mg a day.  Limit alcohol intake to no more than 1 drink a day for nonpregnant women and 2 drinks a day for men. One drink equals 12 oz of beer, 5 oz of wine, or 1 oz of hard liquor.  Work with your health care provider to maintain a healthy body weight or to lose weight. Ask what an ideal weight is for you.  Get at least 30 minutes of exercise that causes your heart to beat faster (aerobic exercise) most days of the week. Activities may include walking, swimming, or biking.  Work with your health care provider or diet and nutrition specialist (dietitian) to adjust your eating plan to your individual calorie needs. Reading food labels  Check food labels for the amount of sodium per serving. Choose foods with less than 5 percent of the Daily Value of sodium. Generally, foods with less than 300 mg of sodium per serving fit into this eating plan.  To find whole grains, look for the word "whole" as the first word in the ingredient list. Shopping  Buy products labeled as "low-sodium" or "no salt added."  Buy fresh foods. Avoid canned foods and premade or frozen meals. Cooking  Avoid adding salt when cooking. Use salt-free seasonings or herbs instead of table salt or sea salt. Check with your health care provider or pharmacist before using salt substitutes.  Do not fry foods. Cook foods using healthy methods such as baking, boiling, grilling, and broiling instead.  Cook with  heart-healthy oils, such as olive, canola, soybean, or sunflower oil. Meal planning   Eat a balanced diet that includes: ? 5 or more servings of fruits and vegetables each day. At each meal, try to fill half of your plate with fruits and vegetables. ? Up to 6-8 servings of whole grains each day. ? Less than 6 oz of lean meat, poultry, or fish each day. A 3-oz serving of meat is about the same size as a deck of cards. One egg equals 1 oz. ? 2 servings of low-fat dairy each day. ? A serving of nuts, seeds, or beans 5 times each week. ? Heart-healthy fats. Healthy fats called Omega-3 fatty acids are found in foods such as flaxseeds and coldwater fish, like sardines, salmon, and mackerel.  Limit how much you eat of the following: ? Canned or prepackaged foods. ? Food that is high in trans fat, such as fried foods. ? Food that is high in saturated fat, such as fatty meat. ? Sweets, desserts, sugary drinks, and other foods with added sugar. ? Full-fat dairy products.  Do not salt foods before eating.  Try to eat at least 2 vegetarian meals each week.  Eat more home-cooked food and less restaurant, buffet, and fast food.  When eating at a restaurant, ask that your food be prepared with less salt or no salt, if possible. What foods are recommended? The items listed may not be a complete list. Talk with your dietitian about what   dietary choices are best for you. Grains Whole-grain or whole-wheat bread. Whole-grain or whole-wheat pasta. Brown rice. Oatmeal. Quinoa. Bulgur. Whole-grain and low-sodium cereals. Pita bread. Low-fat, low-sodium crackers. Whole-wheat flour tortillas. Vegetables Fresh or frozen vegetables (raw, steamed, roasted, or grilled). Low-sodium or reduced-sodium tomato and vegetable juice. Low-sodium or reduced-sodium tomato sauce and tomato paste. Low-sodium or reduced-sodium canned vegetables. Fruits All fresh, dried, or frozen fruit. Canned fruit in natural juice (without  added sugar). Meat and other protein foods Skinless chicken or turkey. Ground chicken or turkey. Pork with fat trimmed off. Fish and seafood. Egg whites. Dried beans, peas, or lentils. Unsalted nuts, nut butters, and seeds. Unsalted canned beans. Lean cuts of beef with fat trimmed off. Low-sodium, lean deli meat. Dairy Low-fat (1%) or fat-free (skim) milk. Fat-free, low-fat, or reduced-fat cheeses. Nonfat, low-sodium ricotta or cottage cheese. Low-fat or nonfat yogurt. Low-fat, low-sodium cheese. Fats and oils Soft margarine without trans fats. Vegetable oil. Low-fat, reduced-fat, or light mayonnaise and salad dressings (reduced-sodium). Canola, safflower, olive, soybean, and sunflower oils. Avocado. Seasoning and other foods Herbs. Spices. Seasoning mixes without salt. Unsalted popcorn and pretzels. Fat-free sweets. What foods are not recommended? The items listed may not be a complete list. Talk with your dietitian about what dietary choices are best for you. Grains Baked goods made with fat, such as croissants, muffins, or some breads. Dry pasta or rice meal packs. Vegetables Creamed or fried vegetables. Vegetables in a cheese sauce. Regular canned vegetables (not low-sodium or reduced-sodium). Regular canned tomato sauce and paste (not low-sodium or reduced-sodium). Regular tomato and vegetable juice (not low-sodium or reduced-sodium). Pickles. Olives. Fruits Canned fruit in a light or heavy syrup. Fried fruit. Fruit in cream or butter sauce. Meat and other protein foods Fatty cuts of meat. Ribs. Fried meat. Bacon. Sausage. Bologna and other processed lunch meats. Salami. Fatback. Hotdogs. Bratwurst. Salted nuts and seeds. Canned beans with added salt. Canned or smoked fish. Whole eggs or egg yolks. Chicken or turkey with skin. Dairy Whole or 2% milk, cream, and half-and-half. Whole or full-fat cream cheese. Whole-fat or sweetened yogurt. Full-fat cheese. Nondairy creamers. Whipped toppings.  Processed cheese and cheese spreads. Fats and oils Butter. Stick margarine. Lard. Shortening. Ghee. Bacon fat. Tropical oils, such as coconut, palm kernel, or palm oil. Seasoning and other foods Salted popcorn and pretzels. Onion salt, garlic salt, seasoned salt, table salt, and sea salt. Worcestershire sauce. Tartar sauce. Barbecue sauce. Teriyaki sauce. Soy sauce, including reduced-sodium. Steak sauce. Canned and packaged gravies. Fish sauce. Oyster sauce. Cocktail sauce. Horseradish that you find on the shelf. Ketchup. Mustard. Meat flavorings and tenderizers. Bouillon cubes. Hot sauce and Tabasco sauce. Premade or packaged marinades. Premade or packaged taco seasonings. Relishes. Regular salad dressings. Where to find more information:  National Heart, Lung, and Blood Institute: www.nhlbi.nih.gov  American Heart Association: www.heart.org Summary  The DASH eating plan is a healthy eating plan that has been shown to reduce high blood pressure (hypertension). It may also reduce your risk for type 2 diabetes, heart disease, and stroke.  With the DASH eating plan, you should limit salt (sodium) intake to 2,300 mg a day. If you have hypertension, you may need to reduce your sodium intake to 1,500 mg a day.  When on the DASH eating plan, aim to eat more fresh fruits and vegetables, whole grains, lean proteins, low-fat dairy, and heart-healthy fats.  Work with your health care provider or diet and nutrition specialist (dietitian) to adjust your eating plan to your individual   calorie needs. This information is not intended to replace advice given to you by your health care provider. Make sure you discuss any questions you have with your health care provider. Document Released: 02/22/2011 Document Revised: 02/27/2016 Document Reviewed: 02/27/2016 Elsevier Interactive Patient Education  2018 Elsevier Inc.  Hypertension Hypertension is another name for high blood pressure. High blood pressure  forces your heart to work harder to pump blood. This can cause problems over time. There are two numbers in a blood pressure reading. There is a top number (systolic) over a bottom number (diastolic). It is best to have a blood pressure below 120/80. Healthy choices can help lower your blood pressure. You may need medicine to help lower your blood pressure if:  Your blood pressure cannot be lowered with healthy choices.  Your blood pressure is higher than 130/80.  Follow these instructions at home: Eating and drinking  If directed, follow the DASH eating plan. This diet includes: ? Filling half of your plate at each meal with fruits and vegetables. ? Filling one quarter of your plate at each meal with whole grains. Whole grains include whole wheat pasta, brown rice, and whole grain bread. ? Eating or drinking low-fat dairy products, such as skim milk or low-fat yogurt. ? Filling one quarter of your plate at each meal with low-fat (lean) proteins. Low-fat proteins include fish, skinless chicken, eggs, beans, and tofu. ? Avoiding fatty meat, cured and processed meat, or chicken with skin. ? Avoiding premade or processed food.  Eat less than 1,500 mg of salt (sodium) a day.  Limit alcohol use to no more than 1 drink a day for nonpregnant women and 2 drinks a day for men. One drink equals 12 oz of beer, 5 oz of wine, or 1 oz of hard liquor. Lifestyle  Work with your doctor to stay at a healthy weight or to lose weight. Ask your doctor what the best weight is for you.  Get at least 30 minutes of exercise that causes your heart to beat faster (aerobic exercise) most days of the week. This may include walking, swimming, or biking.  Get at least 30 minutes of exercise that strengthens your muscles (resistance exercise) at least 3 days a week. This may include lifting weights or pilates.  Do not use any products that contain nicotine or tobacco. This includes cigarettes and e-cigarettes. If you  need help quitting, ask your doctor.  Check your blood pressure at home as told by your doctor.  Keep all follow-up visits as told by your doctor. This is important. Medicines  Take over-the-counter and prescription medicines only as told by your doctor. Follow directions carefully.  Do not skip doses of blood pressure medicine. The medicine does not work as well if you skip doses. Skipping doses also puts you at risk for problems.  Ask your doctor about side effects or reactions to medicines that you should watch for. Contact a doctor if:  You think you are having a reaction to the medicine you are taking.  You have headaches that keep coming back (recurring).  You feel dizzy.  You have swelling in your ankles.  You have trouble with your vision. Get help right away if:  You get a very bad headache.  You start to feel confused.  You feel weak or numb.  You feel faint.  You get very bad pain in your: ? Chest. ? Belly (abdomen).  You throw up (vomit) more than once.  You have trouble breathing.   Summary  Hypertension is another name for high blood pressure.  Making healthy choices can help lower blood pressure. If your blood pressure cannot be controlled with healthy choices, you may need to take medicine. This information is not intended to replace advice given to you by your health care provider. Make sure you discuss any questions you have with your health care provider. Document Released: 08/22/2007 Document Revised: 02/01/2016 Document Reviewed: 02/01/2016 Elsevier Interactive Patient Education  2018 Elsevier Inc.  

## 2017-04-17 ENCOUNTER — Ambulatory Visit (HOSPITAL_COMMUNITY)
Admission: RE | Admit: 2017-04-17 | Discharge: 2017-04-17 | Disposition: A | Discharge status: Home / Self Care | Payer: Self-pay | Source: Ambulatory Visit | Attending: Internal Medicine | Admitting: Internal Medicine

## 2017-04-17 DIAGNOSIS — M25562 Pain in left knee: Secondary | ICD-10-CM | POA: Insufficient documentation

## 2017-04-19 ENCOUNTER — Telehealth: Payer: Self-pay | Admitting: *Deleted

## 2017-04-19 NOTE — Telephone Encounter (Signed)
-----   Message from Tresa Garter, MD sent at 04/19/2017  4:05 PM EST ----- Left knee X Ray showed No acute abnormality.

## 2017-04-19 NOTE — Telephone Encounter (Signed)
Patient verified DOB Patient is aware of xray showing no acute abnormalities. No further questions at this time.

## 2017-05-09 ENCOUNTER — Other Ambulatory Visit: Payer: Self-pay | Admitting: Internal Medicine

## 2017-05-09 MED FILL — PRAVASTATIN NA 20 MG TAB: 20 | 30 days supply | Qty: 15 | Fill #1

## 2017-05-09 MED FILL — valACYclovir HCL 1 GM TABS: 1 | 30 days supply | Qty: 30 | Fill #0

## 2017-05-09 MED FILL — ATENOLOL/CHLORTHAL 50/25: 50-25 | 30 days supply | Qty: 30 | Fill #1

## 2017-05-15 ENCOUNTER — Ambulatory Visit: Payer: Self-pay | Attending: Internal Medicine | Admitting: Internal Medicine

## 2017-05-15 ENCOUNTER — Encounter: Payer: Self-pay | Admitting: Internal Medicine

## 2017-05-15 VITALS — BP 126/86 | HR 52 | Temp 98.6°F | Wt 204.0 lb

## 2017-05-15 DIAGNOSIS — I10 Essential (primary) hypertension: Secondary | ICD-10-CM | POA: Insufficient documentation

## 2017-05-15 DIAGNOSIS — B353 Tinea pedis: Secondary | ICD-10-CM

## 2017-05-15 DIAGNOSIS — Z79899 Other long term (current) drug therapy: Secondary | ICD-10-CM | POA: Insufficient documentation

## 2017-05-15 DIAGNOSIS — R21 Rash and other nonspecific skin eruption: Secondary | ICD-10-CM

## 2017-05-15 DIAGNOSIS — Z7982 Long term (current) use of aspirin: Secondary | ICD-10-CM | POA: Insufficient documentation

## 2017-05-15 MED ORDER — HYDROCORTISONE 1 % EX CREA: 1.0000 "application " | TOPICAL_CREAM | Freq: Two times a day (BID) | CUTANEOUS | 2 refills | Status: DC

## 2017-05-15 MED ORDER — LAMISIL AT EX POWD: CUTANEOUS | 1 refills | Status: DC

## 2017-05-15 NOTE — Progress Notes (Signed)
Subjective:  Patient ID: Corey Hale, male    DOB: [birth year]  Age: 52 y.o. MRN: [id number] HPI Corey Hale is a 52 year old male who presents today for c/o of rash of foot and extremities  . Left Foot: Has been an on-going issues. c/o of itching and peeling between toes. Reports excessive sweating of feet.   Extremities: Started about a week ago, c/o of itching and redness on bilateral arms and legs. Denies changes in laundry detergent, insect bites, or exposure to wooded areas. He sleeps in the same bed with his wife but she does not have a rash on her body. Denies drainage or fevers.     Past Medical History:  Diagnosis Date  . Chronic back pain   . Hypercholesteremia   . Hypertension   . Thyroid disease     Past Surgical History:  Procedure Laterality Date  . ARM HARDWARE REMOVAL Right   . NECK SURGERY    No Known Allergies   Outpatient Medications Prior to Visit  Medication Sig Dispense Refill  . aspirin 81 MG tablet Take 1 tablet (81 mg total) by mouth daily. 90 tablet 3  . atenolol-chlorthalidone (TENORETIC) 50-25 MG tablet Take 1 tablet by mouth daily. 90 tablet 3  . carbamide peroxide (DEBROX) 6.5 % OTIC solution Place 5 drops into both ears 2 (two) times daily. 15 mL 0  . pravastatin (PRAVACHOL) 20 MG tablet Take 0.5 tablets (10 mg total) by mouth daily. 30 tablet 2  . sildenafil (VIAGRA) 100 MG tablet Take 0.5-1 tablets (50-100 mg total) by mouth daily as needed for erectile dysfunction. 30 tablet 3  . triamcinolone cream (KENALOG) 0.1 % Apply 1 application topically 2 (two) times daily. 30 g 1  . valACYclovir (VALTREX) 1000 MG tablet TAKE 1 TABLET BY MOUTH DAILY 30 tablet 12   No facility-administered medications prior to visit.     ROS Review of Systems  Constitutional: Negative.  Negative for fever.  Respiratory: Negative.   Cardiovascular: Negative.  Negative for leg swelling.  Skin: Positive for rash.       Itching and reddness  No numbness and  tingling of the extremities.   Objective:  BP 126/86 (BP Location: Left Arm, Patient Position: Sitting, Cuff Size: Large)   Pulse (!) 52   Temp 98.6 F (37 C) (Oral)   Wt 204 lb (92.5 kg)   SpO2 98%   BMI 27.67 kg/m   BP/Weight 05/15/2017 04/10/2017 06/25/8117  Systolic BP 147 829 562  Diastolic BP 86 77 87  Wt. (Lbs) 204 205.8 -  BMI 27.67 27.91 -   Physical Exam  Constitutional: He appears well-developed and well-nourished.  Cardiovascular: Normal rate, regular rhythm and normal heart sounds.  Pulmonary/Chest: Effort normal and breath sounds normal.  Musculoskeletal: Normal range of motion. He exhibits no edema or tenderness.  Skin: Rash noted.  Scattered linear maculopapular rash on bilateral arms, legs, and left trunk (on back). Fungal infection and peeling located in between toes of left foot.    Assessment & Plan:   1. Tinea pedis of left foot Discussed good hygiene and keeping foot clean and dry.  - Talc-Starch (LAMISIL AT) POWD; Apply to affected area (left Foot) twice a day.  Dispense: 100 g; Refill: 1  2. Rash and nonspecific skin eruption Not sure of origin of the rash. Will treat with hydrocortisone for itching.  - hydrocortisone cream 1 %; Apply 1 application topically 2 (two) times daily.  Dispense: 30 g;  Refill: 2   Meds ordered this encounter  Medications  . Talc-Starch (LAMISIL AT) POWD    Sig: Apply to affected area (left Foot) twice a day.    Dispense:  100 g    Refill:  1  . hydrocortisone cream 1 %    Sig: Apply 1 application topically 2 (two) times daily.    Dispense:  30 g    Refill:  2   Follow-up: Return in about 3 months (around 08/12/2017) for Follow up Pain and comorbidities.   Jonnie Kind DNP Student  Evaluation and management procedures were performed by me with DNP Student in attendance, note written by DNP student under my supervision and collaboration. I have reviewed the note and I agree with the management and plan.   Angelica Chessman, MD, MHA, [place], Karilyn Cota [facility] and [facility] [place], [place]   05/15/2017, 5:58 PM

## 2017-05-15 NOTE — Progress Notes (Signed)
Concerns with skin break down on left foot

## 2017-05-15 NOTE — Patient Instructions (Signed)
Athlete's Foot Athlete's foot (tinea pedis) is a fungal infection of the skin on the feet. It often occurs on the skin that is between or underneath the toes. It can also occur on the soles of the feet. The infection can spread from person to person (is contagious). What are the causes? Athlete's foot is caused by a fungus. This fungus grows in warm, moist places. Most people get athlete's foot by sharing shower stalls, towels, and wet floors with someone who is infected. Not washing your feet or changing your socks often enough can contribute to athlete's foot. What increases the risk? This condition is more likely to develop in:  Men.  People who have a weak body defense system (immune system).  People who have diabetes.  People who use public showers, such as at a gym.  People who wear heavy-duty shoes, such as industrial or military shoes.  Seasons with warm, humid weather.  What are the signs or symptoms? Symptoms of this condition include:  Itchy areas between the toes or on the soles of the feet.  White, flaky, or scaly areas between the toes or on the soles of the feet.  Very itchy small blisters between the toes or on the soles of the feet.  Small cuts on the skin. These cuts can become infected.  Thick or discolored toenails.  How is this diagnosed? This condition is diagnosed with a medical history and physical exam. Your health care provider may also take a skin or toenail sample to be examined. How is this treated? Treatment for this condition includes antifungal medicines. These may be applied as powders, ointments, or creams. In severe cases, an oral antifungal medicine may be given. Follow these instructions at home:  Apply or take over-the-counter and prescription medicines only as told by your health care provider.  Keep all follow-up visits as told by your health care provider. This is important.  Do not scratch your feet.  Keep your feet dry: ? Wear  cotton or wool socks. Change your socks every day or if they become wet. ? Wear shoes that allow air to circulate, such as sandals or canvas tennis shoes.  Wash and dry your feet: ? Every day or as told by your health care provider. ? After exercising. ? Including the area between your toes.  Do not share towels, nail clippers, or other personal items that touch your feet with others.  If you have diabetes, keep your blood sugar under control. How is this prevented?  Do not share towels.  Wear sandals in wet areas, such as locker rooms and shared showers.  Keep your feet dry: ? Wear cotton or wool socks. Change your socks every day or if they become wet. ? Wear shoes that allow air to circulate, such as sandals or canvas tennis shoes.  Wash and dry your feet after exercising. Pay attention to the area between your toes. Contact a health care provider if:  You have a fever.  You have swelling, soreness, warmth, or redness in your foot.  You are not getting better with treatment.  Your symptoms get worse.  You have new symptoms. This information is not intended to replace advice given to you by your health care provider. Make sure you discuss any questions you have with your health care provider. Document Released: 03/02/2000 Document Revised: 08/11/2015 Document Reviewed: 09/06/2014 Elsevier Interactive Patient Education  2018 Elsevier Inc.  

## 2017-06-08 ENCOUNTER — Encounter (HOSPITAL_COMMUNITY): Payer: Self-pay

## 2017-06-08 ENCOUNTER — Emergency Department (HOSPITAL_COMMUNITY)
Admission: EM | Admit: 2017-06-08 | Discharge: 2017-06-09 | Disposition: A | Discharge status: Home / Self Care | Payer: Self-pay | Attending: Emergency Medicine | Admitting: Emergency Medicine

## 2017-06-08 DIAGNOSIS — L03115 Cellulitis of right lower limb: Secondary | ICD-10-CM | POA: Insufficient documentation

## 2017-06-08 DIAGNOSIS — I10 Essential (primary) hypertension: Secondary | ICD-10-CM | POA: Insufficient documentation

## 2017-06-08 DIAGNOSIS — Z79899 Other long term (current) drug therapy: Secondary | ICD-10-CM | POA: Insufficient documentation

## 2017-06-08 DIAGNOSIS — F17200 Nicotine dependence, unspecified, uncomplicated: Secondary | ICD-10-CM | POA: Insufficient documentation

## 2017-06-08 DIAGNOSIS — Z7982 Long term (current) use of aspirin: Secondary | ICD-10-CM | POA: Insufficient documentation

## 2017-06-08 DIAGNOSIS — R600 Localized edema: Secondary | ICD-10-CM

## 2017-06-08 NOTE — ED Notes (Signed)
Pt is c/o right leg pain and mild swelling, quick inspection reveals a lesion and discoloration on the anterior superior tibial aspect consistent with cellulitis.

## 2017-06-09 MED ORDER — DOXYCYCLINE HYCLATE 100 MG PO CAPS: 100.0000 mg | ORAL_CAPSULE | Freq: Two times a day (BID) | ORAL | 0 refills | Status: DC

## 2017-06-09 MED ORDER — IBUPROFEN 800 MG PO TABS
800.0000 mg | ORAL_TABLET | Freq: Once | ORAL | Status: AC
  Administered 2017-06-09: 800 mg via ORAL
  Filled 2017-06-09: qty 1

## 2017-06-09 MED ORDER — IBUPROFEN 800 MG PO TABS: 800.0000 mg | ORAL_TABLET | Freq: Three times a day (TID) | ORAL | 0 refills | Status: DC

## 2017-06-09 MED ORDER — DOXYCYCLINE HYCLATE 100 MG PO TABS
100.0000 mg | ORAL_TABLET | Freq: Once | ORAL | Status: AC
  Administered 2017-06-09: 100 mg via ORAL
  Filled 2017-06-09: qty 1

## 2017-06-09 NOTE — Discharge Instructions (Addendum)
1. Medications: Doxycycline, usual home medications 2. Treatment: rest, drink plenty of fluids, use warm compresses, flush abscess with warm water several times per day 3. Follow Up: Please followup in the emergency room for wound check in 2 days.  Please also follow-up with your primary doctor in 3-5 days for discussion of your diagnoses and further evaluation after today's visit; Please return to the ER for fevers, chills, nausea, vomiting or other signs of infection

## 2017-06-09 NOTE — ED Provider Notes (Signed)
[facility] DEPT Provider Note   CSN: [id number] Arrival date & time: 06/08/17  2221     History   Chief Complaint Chief Complaint  Patient presents with  . Leg Swelling    Right    HPI Corey Hale is a 52 y.o. male with a hx of chronic back pain, hypertension, thyroid disease presents to the Emergency Department complaining of gradual, persistent, progressively worsening sites of infection in the right lower leg onset 2-3 days ago.  Patient reports that his leg was itching and he was scratching it aggressively which caused some open wounds.  He reports that over the last several days these have become red, hot and painful.  Additionally he has some edema of the right lower anterior leg.  No treatments prior to arrival.  Patient denies previous episodes of abscess however shows that patient has been evaluated several times for abscess and cellulitis of multiple sites.  Patient is not a diabetic and has no known immunocompromise.  Patient makes the pain worse.  Nothing seems to make his symptoms better.  He denies fever, chills, headache, abdominal pain, nausea, vomiting.  The history is provided by the patient and medical records. No language interpreter was used.    Past Medical History:  Diagnosis Date  . Chronic back pain   . Hypercholesteremia   . Hypertension   . Thyroid disease     Patient Active Problem List   Diagnosis Date Noted  . Herpes exposure 03/28/2016  . Chronic back pain 05/30/2015  . Mid back pain, chronic 05/26/2015  . Chronic midline low back pain without sciatica 05/26/2015  . Midline thoracic back pain 03/07/2015  . Back pain at L4-L5 level 07/06/2014  . History of vertebral fracture 05/20/2014  . Essential hypertension 02/23/2014  . 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 Hearing loss 09/07/2013  . Dental caries 06/10/2012  . Erectile dysfunction 06/10/2012  . Tobacco abuse 06/10/2012  . Dyslipidemia 06/10/2012  . Personal  history of goiter 06/10/2012  . Genital herpes 03/05/2012  . Thoracic back pain 07/21/2009  . THYROID NODULE, HX OF 07/21/2009    Past Surgical History:  Procedure Laterality Date  . ARM HARDWARE REMOVAL Right   . NECK SURGERY          Home Medications    Prior to Admission medications   Medication Sig Start Date End Date Taking? Authorizing Provider  aspirin 81 MG tablet Take 1 tablet (81 mg total) by mouth daily. 03/07/15   Tresa Garter, MD  atenolol-chlorthalidone (TENORETIC) 50-25 MG tablet Take 1 tablet by mouth daily. 04/10/17   Tresa Garter, MD  carbamide peroxide (DEBROX) 6.5 % OTIC solution Place 5 drops into both ears 2 (two) times daily. 04/10/17   Tresa Garter, MD  doxycycline (VIBRAMYCIN) 100 MG capsule Take 1 capsule (100 mg total) by mouth 2 (two) times daily. 06/09/17   Margi Edmundson, Jarrett Soho, PA-C  hydrocortisone cream 1 % Apply 1 application topically 2 (two) times daily. 05/15/17   Tresa Garter, MD  ibuprofen (ADVIL,MOTRIN) 800 MG tablet Take 1 tablet (800 mg total) by mouth 3 (three) times daily. 06/09/17   Martese Vanatta, Jarrett Soho, PA-C  pravastatin (PRAVACHOL) 20 MG tablet Take 0.5 tablets (10 mg total) by mouth daily. 04/10/17   Tresa Garter, MD  sildenafil (VIAGRA) 100 MG tablet Take 0.5-1 tablets (50-100 mg total) by mouth daily as needed for erectile dysfunction. 04/10/17   Tresa Garter, MD  Talc-Starch (LAMISIL AT)  POWD Apply to affected area (left Foot) twice a day. 05/15/17   Tresa Garter, MD  triamcinolone cream (KENALOG) 0.1 % Apply 1 application topically 2 (two) times daily. 04/04/17   Ward, Ozella Almond, PA-C  valACYclovir (VALTREX) 1000 MG tablet TAKE 1 TABLET BY MOUTH DAILY 05/09/17   Tresa Garter, MD    Family History Family History  Problem Relation Age of Onset  . Stroke Mother   . Stroke Father     Social History Social History   Tobacco Use  . Smoking status: Current Every Day Smoker      Packs/day: 0.25    Last attempt to quit: 03/23/2015    Years since quitting: 2.2  . Smokeless tobacco: Never Used  Substance Use Topics  . Alcohol use: No    Alcohol/week: 0.0 oz  . Drug use: No     Allergies   Patient has no known allergies.   Review of Systems Review of Systems  Constitutional: Negative for chills and fever.  Respiratory: Negative for chest tightness and shortness of breath.   Cardiovascular: Positive for leg swelling.  Gastrointestinal: Negative for nausea and vomiting.  Endocrine: Negative for polydipsia, polyphagia and polyuria.  Musculoskeletal: Positive for back pain ( chronic).  Skin: Positive for color change.       Abscess  Allergic/Immunologic: Negative for immunocompromised state.  Neurological: Negative for headaches.  Hematological: Does not bruise/bleed easily.  Psychiatric/Behavioral: The patient is not nervous/anxious.      Physical Exam Updated Vital Signs BP 120/90 (BP Location: Left Arm)   Pulse 69   Temp 98.4 F (36.9 C) (Oral)   Resp 18   Ht 6\' 1"  (1.854 m)   Wt 93 kg (205 lb)   SpO2 97%   BMI 27.05 kg/m   Physical Exam  Constitutional: He is oriented to person, place, and time. He appears well-developed and well-nourished. No distress.  HENT:  Head: Normocephalic and atraumatic.  Eyes: Conjunctivae are normal. No scleral icterus.  Neck: Normal range of motion.  Cardiovascular: Normal rate, regular rhythm and intact distal pulses.  Pulmonary/Chest: Effort normal and breath sounds normal.  Abdominal: Soft. He exhibits no distension. There is no tenderness.  Musculoskeletal:  Right lower extremity with 3 sites of erythema, induration and tenderness on the anterior shin with surrounding erythema and increased warmth  Lymphadenopathy:    He has no cervical adenopathy.  Neurological: He is alert and oriented to person, place, and time.  Skin: Skin is warm and dry. He is not diaphoretic. There is erythema.  Psychiatric: He  has a normal mood and affect.  Nursing note and vitals reviewed.        ED Treatments / Results   Procedures Procedures (including critical care time)  EMERGENCY DEPARTMENT US SOFT TISSUE INTERPRETATION "Study: Limited Soft Tissue Ultrasound"  INDICATIONS: Pain and Soft tissue infection Multiple views of the body part were obtained in real-time with a multi-frequency linear probe  PERFORMED BY: Myself IMAGES ARCHIVED?: Yes SIDE:Right  BODY PART:Lower extremity INTERPRETATION:  Cellulitis present    Medications Ordered in ED Medications  doxycycline (VIBRA-TABS) tablet 100 mg (100 mg Oral Given 06/09/17 0043)  ibuprofen (ADVIL,MOTRIN) tablet 800 mg (800 mg Oral Given 06/09/17 0043)     Initial Impression / Assessment and Plan / ED Course  I have reviewed the triage vital signs and the nursing notes.  Pertinent labs & imaging results that were available during my care of the patient were reviewed by me and considered  in my medical decision making (see chart for details).     Patient presents with cellulitis to the right anterior lower extremity.  Ultrasound shows cobblestoning and clear evidence of cellulitis.  Several small fluid collections noticed.  Discussed risk versus benefit of I&D with patient who elects to not attempt to I&D at this time.  Will give doxycycline and ibuprofen.  Discussed home therapies including warm compresses.  Patient is to return to the emergency department in 2 days for wound check.  Discussed the potential for I&D at return visit if fluid collections increase.  Patient states understanding and is in agreement with the plan.  Vitals are normal.  No evidence of systemic infection.  Cellulitis edges marked and patient instructed to return immediately if erythema or induration spreads beyond the site marked.  Currently there is no streaking up the leg or circumferential erythema or edema.  Highly doubt DVT at this time as edema is localized to the  anterior portion of the leg at the site of the cellulitis.   Final Clinical Impressions(s) / ED Diagnoses   Final diagnoses:  Cellulitis of right lower extremity  Leg edema    ED Discharge Orders        Ordered    doxycycline (VIBRAMYCIN) 100 MG capsule  2 times daily     06/09/17 0022    ibuprofen (ADVIL,MOTRIN) 800 MG tablet  3 times daily     06/09/17 0022       Shannie Kontos, [place], PA-C 06/09/17 0044    Ezequiel Essex, MD 06/09/17 [phone]

## 2017-06-12 MED FILL — !VIAGRA 100MG TABLET: 100 | 30 days supply | Qty: 10 | Fill #1

## 2017-06-12 MED FILL — ATENOLOL/CHLORTHAL 50/25: 50-25 | 30 days supply | Qty: 30 | Fill #2

## 2017-06-12 MED FILL — valACYclovir HCL 1 GM TABS: 1 | 30 days supply | Qty: 30 | Fill #1

## 2017-06-12 MED FILL — PRAVASTATIN NA 20 MG TAB: 20 | 30 days supply | Qty: 15 | Fill #2

## 2017-06-25 IMAGING — DX DG THORACOLUMBAR SPINE 2V
4 series · 4 of 4 positions shown · non-contrast
Comparison: Chest x-ray of 09/08/2012

CLINICAL DATA: Chronic mid to low back pain, history of compression
fractures in 3668

EXAM:
THORACOLUMBAR SPINE 1V

[tl-spine ap (1 of 2)]
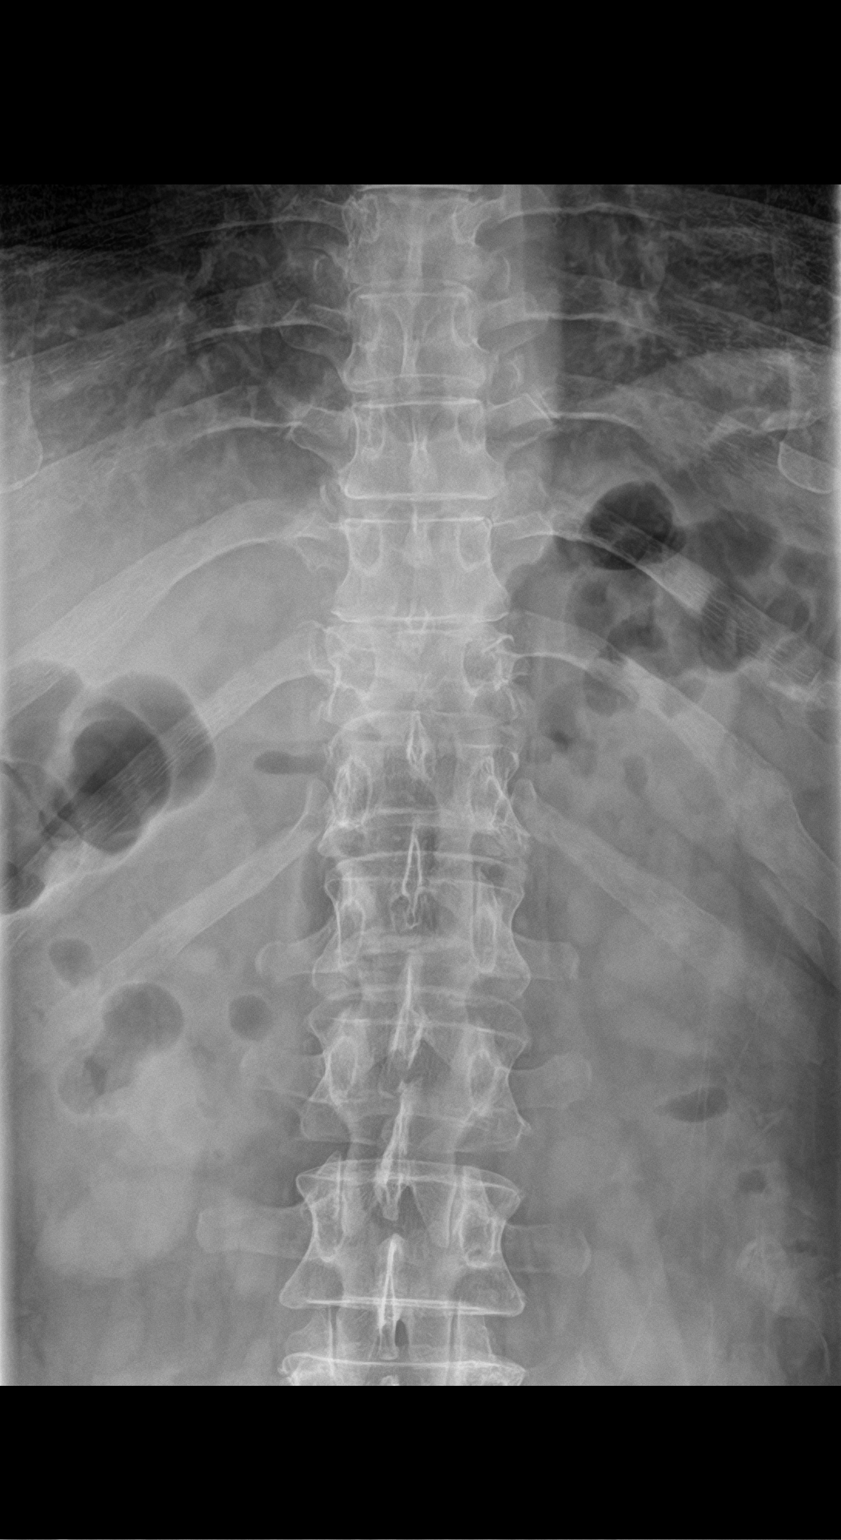

[tl-spine lat (1 of 2)]
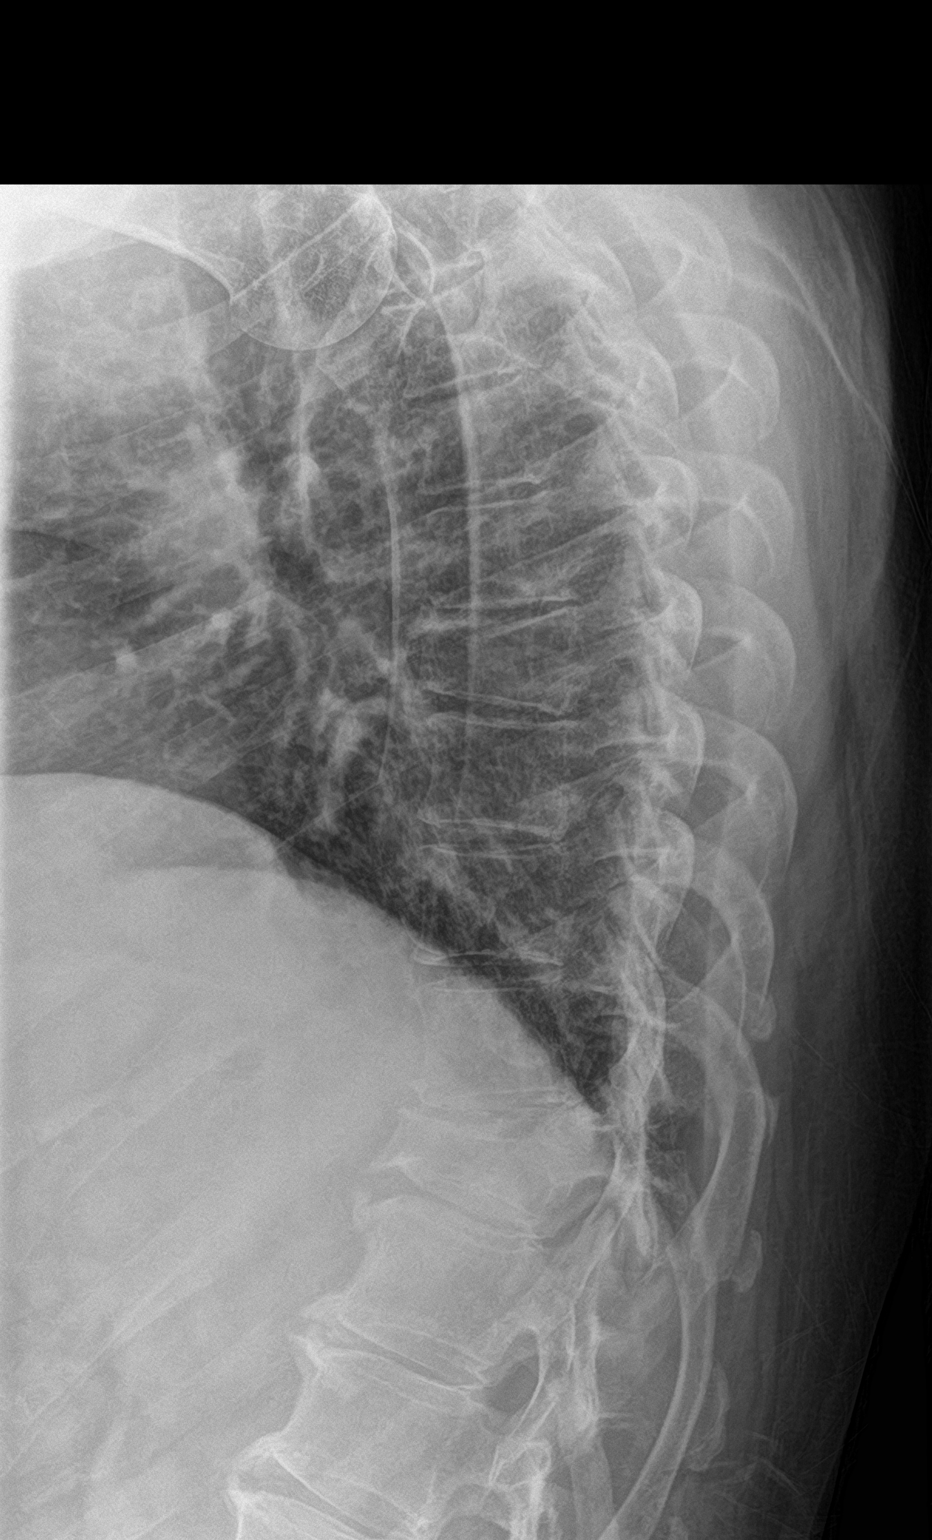

[tl-spine ap (2 of 2)]
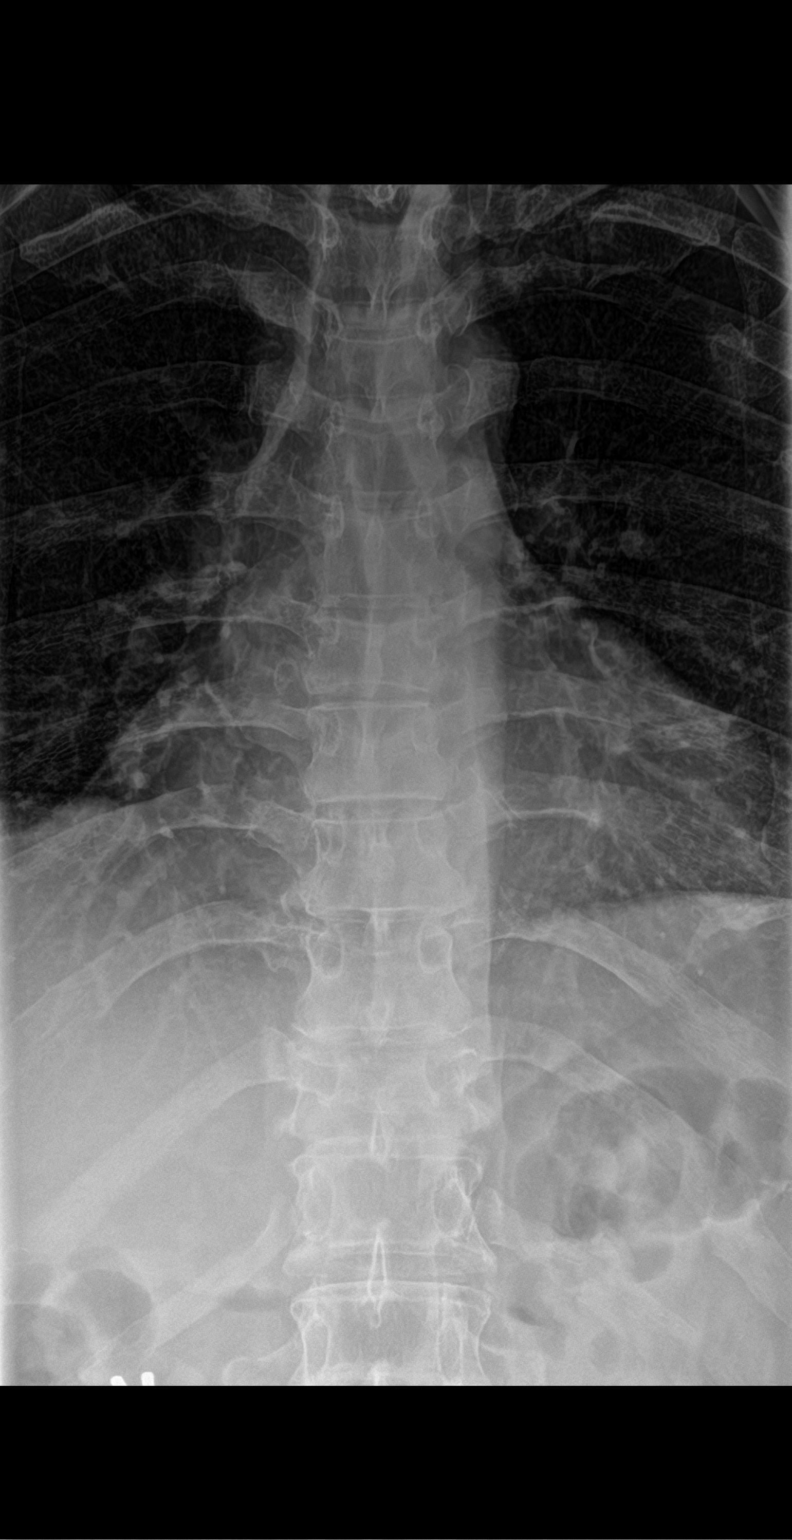

[tl-spine lat (2 of 2)]
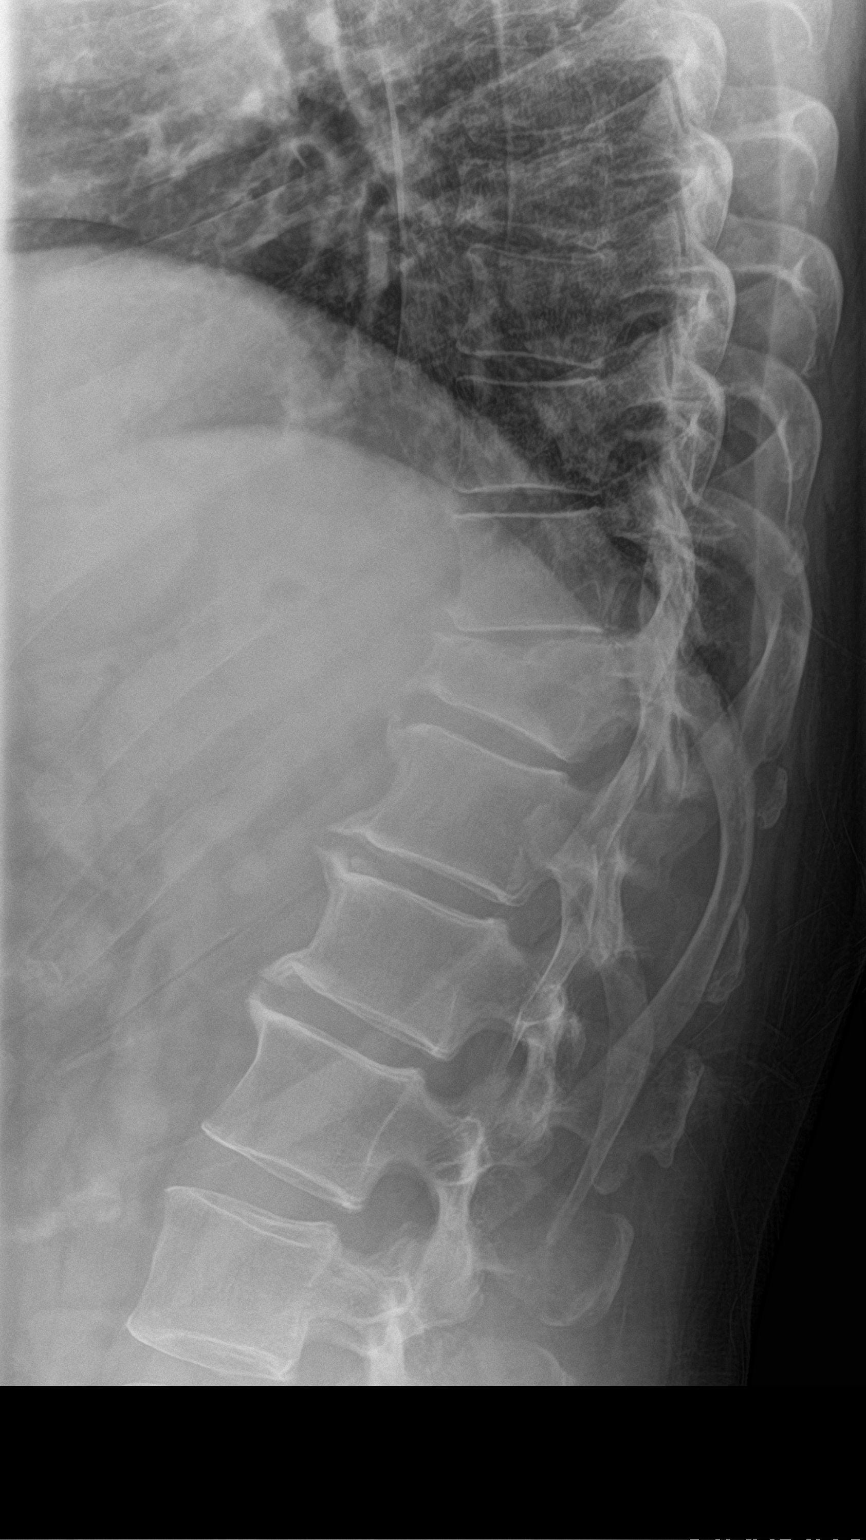

[4 of 4 positions shown; findings below may reference images not displayed]

FINDINGS: The thoracic vertebrae are in normal alignment. There are old
compression deformities of T7 and T11 vertebral bodies with bony
spurring. No acute compression deformity is seen. No prominent
paravertebral soft tissue is noted.
IMPRESSION: No acute abnormality.  Old compression deformities of T7 and T11.

## 2017-06-30 ENCOUNTER — Emergency Department (HOSPITAL_COMMUNITY)
Admission: EM | Admit: 2017-06-30 | Discharge: 2017-06-30 | Discharge status: Against Medical Advice | Payer: Medicaid Other

## 2017-07-10 MED FILL — ATENOLOL/CHLORTHAL 50/25: 50-25 | 30 days supply | Qty: 30 | Fill #3

## 2017-07-10 MED FILL — PRAVASTATIN NA 20 MG TAB: 20 | 30 days supply | Qty: 15 | Fill #3

## 2017-07-13 ENCOUNTER — Emergency Department (HOSPITAL_BASED_OUTPATIENT_CLINIC_OR_DEPARTMENT_OTHER)
Admission: EM | Admit: 2017-07-13 | Discharge: 2017-07-13 | Disposition: A | Discharge status: Home / Self Care | Payer: Medicaid Other | Attending: Emergency Medicine | Admitting: Emergency Medicine

## 2017-07-13 ENCOUNTER — Encounter (HOSPITAL_BASED_OUTPATIENT_CLINIC_OR_DEPARTMENT_OTHER): Payer: Self-pay

## 2017-07-13 ENCOUNTER — Emergency Department (HOSPITAL_BASED_OUTPATIENT_CLINIC_OR_DEPARTMENT_OTHER): Payer: Medicaid Other

## 2017-07-13 ENCOUNTER — Other Ambulatory Visit: Payer: Self-pay

## 2017-07-13 DIAGNOSIS — M7122 Synovial cyst of popliteal space [Baker], left knee: Secondary | ICD-10-CM

## 2017-07-13 DIAGNOSIS — F17200 Nicotine dependence, unspecified, uncomplicated: Secondary | ICD-10-CM | POA: Insufficient documentation

## 2017-07-13 DIAGNOSIS — M25562 Pain in left knee: Secondary | ICD-10-CM | POA: Insufficient documentation

## 2017-07-13 DIAGNOSIS — I10 Essential (primary) hypertension: Secondary | ICD-10-CM | POA: Insufficient documentation

## 2017-07-13 MED ORDER — IBUPROFEN 800 MG PO TABS
800.0000 mg | ORAL_TABLET | Freq: Once | ORAL | Status: AC
  Administered 2017-07-13: 800 mg via ORAL
  Filled 2017-07-13: qty 1

## 2017-07-13 MED ORDER — IBUPROFEN 800 MG PO TABS: 800.0000 mg | ORAL_TABLET | Freq: Three times a day (TID) | ORAL | 0 refills | Status: DC | PRN

## 2017-07-13 NOTE — ED Notes (Signed)
Pt given rx x 1 for ibuprofen. Pt verbalized understanding of f/u care. Ace wrap applied to left knee prior to d/c. Ambulatory to d/c window with steady gait.

## 2017-07-13 NOTE — ED Triage Notes (Addendum)
Pt reports left knee pain/swelling for 3 weeks. Unsure of known injury - possible twist one month ago - several ED visits - but left without being seen. Also reports BLE itching. Also reports reports "tumor" to left popliteal area. Pt also concerned about left elbow pain.

## 2017-07-13 NOTE — ED Notes (Signed)
Patient transported to US 

## 2017-07-13 NOTE — ED Notes (Signed)
ED Provider at bedside. 

## 2017-07-13 NOTE — Discharge Instructions (Signed)
You were seen in the ED today with left knee pain. There is a cyst behind the knee that should resolve with using the compression bandage and Motrin. Call the sports medicine doctor listed in one week if pain continues.   Return to the ED with any new or worsening symptoms.

## 2017-07-13 NOTE — ED Provider Notes (Signed)
Emergency Department Provider Note   I have reviewed the triage vital signs and the nursing notes.   HISTORY  Chief Complaint Joint Swelling   HPI Corey Hale is a 52 y.o. male with PMH of HLD, HTN, and recurrent right leg infections/swelling resents to the emergency department for evaluation of left knee pain and swelling.  Patient has had symptoms for 3 weeks.  He believes near the time of onset he had a twisting type injury with resulting pain and mild swelling.  He has pain that is worse with movement or walking.  He also feels there is fullness and pain behind the left knee.  Denies any fevers, chills, joint redness.  No recurrent infections. No recurrent infections in the right or left leg.    Past Medical History:  Diagnosis Date  . Chronic back pain   . Hypercholesteremia   . Hypertension   . Thyroid disease     Patient Active Problem List   Diagnosis Date Noted  . Herpes exposure 03/28/2016  . Chronic back pain 05/30/2015  . Mid back pain, chronic 05/26/2015  . Chronic midline low back pain without sciatica 05/26/2015  . Midline thoracic back pain 03/07/2015  . Back pain at L4-L5 level 07/06/2014  . History of vertebral fracture 05/20/2014  . Essential hypertension 02/23/2014  . 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 Hearing loss 09/07/2013  . Dental caries 06/10/2012  . Erectile dysfunction 06/10/2012  . Tobacco abuse 06/10/2012  . Dyslipidemia 06/10/2012  . Personal history of goiter 06/10/2012  . Genital herpes 03/05/2012  . Thoracic back pain 07/21/2009  . THYROID NODULE, HX OF 07/21/2009    Past Surgical History:  Procedure Laterality Date  . ARM HARDWARE REMOVAL Right   . NECK SURGERY      Current Outpatient Rx  . Order #: [id number] Class: Normal  . Order #: [id number] Class: Normal  . Order #: [id number] Class: Normal  . Order #: [id number] Class: Normal  . Order #: [id number] Class: Normal  . Order #: [id number] Class: Print  . Order #: [id number] Class:  Normal  . Order #: [id number] Class: Print  . Order #: [id number] Class: Normal  . Order #: [id number] Class: Normal  . Order #: [id number] Class: Print    Allergies Patient has no known allergies.  Family History  Problem Relation Age of Onset  . Stroke Mother   . Stroke Father     Social History Social History   Tobacco Use  . Smoking status: Current Every Day Smoker    Packs/day: 0.25    Last attempt to quit: 03/23/2015    Years since quitting: 2.3  . Smokeless tobacco: Never Used  Substance Use Topics  . Alcohol use: No    Alcohol/week: 0.0 oz  . Drug use: No    Review of Systems  Constitutional: No fever/chills Eyes: No visual changes. ENT: No sore throat. Cardiovascular: Denies chest pain. Respiratory: Denies shortness of breath. Gastrointestinal: No abdominal pain.  No nausea, no vomiting.  No diarrhea.  No constipation. Genitourinary: Negative for dysuria. Musculoskeletal: Negative for back pain. Right knee pain and swelling.  Skin: Negative for rash. Neurological: Negative for headaches, focal weakness or numbness.  10-point ROS otherwise negative.  ____________________________________________   PHYSICAL EXAM:  VITAL SIGNS: Vitals:   07/13/17 1859 07/13/17 2105  BP:  120/75  Pulse:  62  Resp:  18  Temp: 98 F (36.7 C) 98 F (36.7 C)  SpO2:  98%    Constitutional: Alert and oriented. Well appearing and in no acute  distress. Eyes: Conjunctivae are normal. Head: Atraumatic. Nose: No congestion/rhinnorhea. Mouth/Throat: Mucous membranes are moist.   Neck: No stridor.   Cardiovascular: Good peripheral circulation.  Respiratory: Normal respiratory effort.  Gastrointestinal: No distention.  Musculoskeletal: Positive left knee effusion without erythema or warmth. Normal ROM of the right knee. No ankle tenderness. Mild tenderness in the left popliteal fossa. The RLE is unremarkable.  Neurologic:  Normal speech and language. No gross focal neurologic  deficits are appreciated.  Skin:  Skin is warm, dry and intact. No rash noted.  ____________________________________________  RADIOLOGY  Dg Knee 2 Views Left  Result Date: 07/13/2017 CLINICAL DATA:  Pt. States swelling around total left knee x one month; states pain anteriorly and posteriorly EXAM: LEFT KNEE - 1-2 VIEW COMPARISON:  04/17/2017 FINDINGS: No acute fracture. Old healed fracture of the proximal fibular shaft, stable. Mild narrowing of the medial joint space compartment with small associated marginal osteophytes. Remaining joint space compartments are well preserved. No other arthropathic change. No convincing joint effusion.  Soft tissues are unremarkable. IMPRESSION: 1. No fracture or acute finding. 2. 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 3. No convincing joint effusion. Electronically Signed   By: Lajean Manes M.D.   On: 07/13/2017 20:11   US Venous Img Lower  Left (dvt Study)  Result Date: 07/13/2017 CLINICAL DATA:  Left knee pain and swelling for 1 month. EXAM: LEFT LOWER EXTREMITY VENOUS DOPPLER ULTRASOUND TECHNIQUE: Gray-scale sonography with graded compression, as well as color Doppler and duplex ultrasound were performed to evaluate the lower extremity deep venous systems from the level of the common femoral vein and including the common femoral, femoral, profunda femoral, popliteal and calf veins including the posterior tibial, peroneal and gastrocnemius veins when visible. The superficial great saphenous vein was also interrogated. Spectral Doppler was utilized to evaluate flow at rest and with distal augmentation maneuvers in the common femoral, femoral and popliteal veins. COMPARISON:  None. FINDINGS: Contralateral Common Femoral Vein: Respiratory phasicity is normal and symmetric with the symptomatic side. No evidence of thrombus. Normal compressibility. Common Femoral Vein: No evidence of thrombus. Normal compressibility, respiratory phasicity and  response to augmentation. Saphenofemoral Junction: No evidence of thrombus. Normal compressibility and flow on color Doppler imaging. Profunda Femoral Vein: No evidence of thrombus. Normal compressibility and flow on color Doppler imaging. Femoral Vein: No evidence of thrombus. Normal compressibility, respiratory phasicity and response to augmentation. Popliteal Vein: No evidence of thrombus. Normal compressibility, respiratory phasicity and response to augmentation. Calf Veins: No evidence of thrombus. Normal compressibility and flow on color Doppler imaging. Superficial Great Saphenous Vein: No evidence of thrombus. Normal compressibility. Venous Reflux:  None. Other Findings: Complex popliteal cyst measuring 6 cm in longest dimension. Small knee joint effusion. This was not apparent on the current radiographs. IMPRESSION: 1. No evidence of deep venous thrombosis. 2. 6 cm complex Baker's cyst.  Small left knee joint effusion. Electronically Signed   By: Lajean Manes M.D.   On: 07/13/2017 20:54    ____________________________________________   PROCEDURES  Procedure(s) performed:   Procedures  None ____________________________________________   INITIAL IMPRESSION / ASSESSMENT AND PLAN / ED COURSE  Pertinent labs & imaging results that were available during my care of the patient were reviewed by me and considered in my medical decision making (see chart for details).  Since to the emergency department for evaluation of left knee pain and swelling with some swelling and pain in the left popliteal fossa.  There is mild discomfort in this  area.  The patient does have a left knee effusion with no concern for septic arthritis.  Given concern for twisting injury plan for plain film of the left knee along with ultrasound of the left lower extremity to rule out DVT.  Patient denies any chest pain or dyspnea symptoms.   Plain film and [place] reviewed. Patient with a complex baker's cyst on [place]. No DVT.  Plan for ACE wrap and motrin. Referred to sports med. Patient will call on Monday for an appointment.   At this time, I do not feel there is any life-threatening condition present. I have reviewed and discussed all results (EKG, imaging, lab, urine as appropriate), exam findings with patient. I have reviewed nursing notes and appropriate previous records.  I feel the patient is safe to be discharged home without further emergent workup. Discussed usual and customary return precautions. Patient and family (if present) verbalize understanding and are comfortable with this plan.  Patient will follow-up with their primary care provider. If they do not have a primary care provider, information for follow-up has been provided to them. All questions have been answered.  ____________________________________________  FINAL CLINICAL IMPRESSION(S) / ED DIAGNOSES  Final diagnoses:  Acute pain of left knee  Synovial cyst of left popliteal space     MEDICATIONS GIVEN DURING THIS VISIT:  Medications  ibuprofen (ADVIL,MOTRIN) tablet 800 mg (800 mg Oral Given 07/13/17 1928)     NEW OUTPATIENT MEDICATIONS STARTED DURING THIS VISIT:  Motrin 800 mg   Note:  This document was prepared using Dragon voice recognition software and may include unintentional dictation errors.  Nanda Quinton, MD Emergency Medicine    Kamree Wiens, [facility], MD 07/13/17 2233

## 2017-07-15 MED FILL — IBUPROFEN 800 MG TABLET: 800 | 7 days supply | Qty: 21 | Fill #0

## 2017-07-21 ENCOUNTER — Inpatient Hospital Stay (HOSPITAL_COMMUNITY)
Admission: EM | Admit: 2017-07-21 | Discharge: 2017-07-24 | DRG: 331 | Disposition: A | Discharge status: Home / Self Care | Payer: Medicaid Other | Attending: General Surgery | Admitting: General Surgery

## 2017-07-21 ENCOUNTER — Encounter (HOSPITAL_COMMUNITY): Payer: Self-pay | Admitting: Emergency Medicine

## 2017-07-21 ENCOUNTER — Emergency Department (HOSPITAL_COMMUNITY): Payer: Medicaid Other

## 2017-07-21 DIAGNOSIS — I10 Essential (primary) hypertension: Secondary | ICD-10-CM | POA: Diagnosis present

## 2017-07-21 DIAGNOSIS — K611 Rectal abscess: Principal | ICD-10-CM

## 2017-07-21 DIAGNOSIS — G8929 Other chronic pain: Secondary | ICD-10-CM | POA: Diagnosis present

## 2017-07-21 DIAGNOSIS — E7800 Pure hypercholesterolemia, unspecified: Secondary | ICD-10-CM | POA: Diagnosis present

## 2017-07-21 DIAGNOSIS — Z7982 Long term (current) use of aspirin: Secondary | ICD-10-CM

## 2017-07-21 DIAGNOSIS — I959 Hypotension, unspecified: Secondary | ICD-10-CM | POA: Diagnosis not present

## 2017-07-21 DIAGNOSIS — K648 Other hemorrhoids: Secondary | ICD-10-CM | POA: Diagnosis present

## 2017-07-21 DIAGNOSIS — F17210 Nicotine dependence, cigarettes, uncomplicated: Secondary | ICD-10-CM | POA: Diagnosis present

## 2017-07-21 DIAGNOSIS — K6289 Other specified diseases of anus and rectum: Secondary | ICD-10-CM | POA: Diagnosis present

## 2017-07-21 DIAGNOSIS — E785 Hyperlipidemia, unspecified: Secondary | ICD-10-CM | POA: Diagnosis present

## 2017-07-21 DIAGNOSIS — E079 Disorder of thyroid, unspecified: Secondary | ICD-10-CM | POA: Diagnosis present

## 2017-07-21 DIAGNOSIS — Z720 Tobacco use: Secondary | ICD-10-CM | POA: Diagnosis present

## 2017-07-21 DIAGNOSIS — M549 Dorsalgia, unspecified: Secondary | ICD-10-CM | POA: Diagnosis present

## 2017-07-21 DIAGNOSIS — Z79899 Other long term (current) drug therapy: Secondary | ICD-10-CM

## 2017-07-21 DIAGNOSIS — Z872 Personal history of diseases of the skin and subcutaneous tissue: Secondary | ICD-10-CM

## 2017-07-21 LAB — BASIC METABOLIC PANEL
ANION GAP: 10 (ref 5–15)
BUN: 10 mg/dL (ref 6–20)
CO2: 31 mmol/L (ref 22–32)
Calcium: 9.3 mg/dL (ref 8.9–10.3)
Chloride: 98 mmol/L — ABNORMAL LOW (ref 101–111)
Creatinine, Ser: 0.78 mg/dL (ref 0.61–1.24)
GFR calc Af Amer: >60 mL/min (ref >60)
GFR calc non Af Amer: >60 mL/min (ref >60)
GLUCOSE: 85 mg/dL (ref 65–99)
POTASSIUM: 3 mmol/L — AB (ref 3.5–5.1)
Sodium: 139 mmol/L (ref 135–145)

## 2017-07-21 LAB — CBC WITH DIFFERENTIAL/PLATELET
BASOS ABS: 0 10*3/uL (ref 0.0–0.1)
Basophils Relative: 0 %
Eosinophils Absolute: 0.2 10*3/uL (ref 0.0–0.7)
Eosinophils Relative: 2 %
HEMATOCRIT: 43.1 % (ref 39.0–52.0)
Hemoglobin: 14.5 g/dL (ref 13.0–17.0)
LYMPHS PCT: 14 %
Lymphs Abs: 1.9 10*3/uL (ref 0.7–4.0)
MCH: 29.8 pg (ref 26.0–34.0)
MCHC: 33.6 g/dL (ref 30.0–36.0)
MCV: 88.7 fL (ref 78.0–100.0)
Monocytes Absolute: 1.3 10*3/uL — ABNORMAL HIGH (ref 0.1–1.0)
Monocytes Relative: 10 %
Neutro Abs: 9.9 10*3/uL — ABNORMAL HIGH (ref 1.7–7.7)
Neutrophils Relative %: 74 %
Platelets: 235 10*3/uL (ref 150–400)
RBC: 4.86 MIL/uL (ref 4.22–5.81)
RDW: 13.8 % (ref 11.5–15.5)
WBC: 13.4 10*3/uL — AB (ref 4.0–10.5)

## 2017-07-21 MED ORDER — SODIUM CHLORIDE 0.9 % IV SOLN
2.0000 g | INTRAVENOUS | Status: AC
  Administered 2017-07-22: 2 g via INTRAVENOUS
  Filled 2017-07-21 (×2): qty 20

## 2017-07-21 MED ORDER — ALUM & MAG HYDROXIDE-SIMETH 200-200-20 MG/5ML PO SUSP: 30.0000 mL | Freq: Four times a day (QID) | ORAL | Status: DC | PRN

## 2017-07-21 MED ORDER — ENOXAPARIN SODIUM 40 MG/0.4ML ~~LOC~~ SOLN: 40.0000 mg | SUBCUTANEOUS | Status: DC

## 2017-07-21 MED ORDER — ACETAMINOPHEN 325 MG PO TABS
650.0000 mg | ORAL_TABLET | Freq: Once | ORAL | Status: AC
  Administered 2017-07-21: 650 mg via ORAL
  Filled 2017-07-21: qty 2

## 2017-07-21 MED ORDER — LIP MEDEX EX OINT: 1.0000 "application " | TOPICAL_OINTMENT | Freq: Two times a day (BID) | CUTANEOUS | Status: DC

## 2017-07-21 MED ORDER — ACETAMINOPHEN 325 MG PO TABS
650.0000 mg | ORAL_TABLET | Freq: Four times a day (QID) | ORAL | Status: DC | PRN
  Administered 2017-07-22: 650 mg via ORAL
  Filled 2017-07-21: qty 2

## 2017-07-21 MED ORDER — DIPHENHYDRAMINE HCL 50 MG/ML IJ SOLN: 12.5000 mg | Freq: Four times a day (QID) | INTRAMUSCULAR | Status: DC | PRN

## 2017-07-21 MED ORDER — PROCHLORPERAZINE EDISYLATE 10 MG/2ML IJ SOLN: 5.0000 mg | INTRAMUSCULAR | Status: DC | PRN

## 2017-07-21 MED ORDER — ONDANSETRON 4 MG PO TBDP: 4.0000 mg | ORAL_TABLET | Freq: Four times a day (QID) | ORAL | Status: DC | PRN

## 2017-07-21 MED ORDER — OXYCODONE HCL 5 MG PO TABS: 5.0000 mg | ORAL_TABLET | ORAL | Status: DC | PRN

## 2017-07-21 MED ORDER — CHLORHEXIDINE GLUCONATE CLOTH 2 % EX PADS: 6.0000 | MEDICATED_PAD | Freq: Once | CUTANEOUS | Status: DC

## 2017-07-21 MED ORDER — PIPERACILLIN-TAZOBACTAM 3.375 G IVPB
3.3750 g | Freq: Three times a day (TID) | INTRAVENOUS | Status: DC
  Administered 2017-07-21 – 2017-07-22 (×2): 3.375 g via INTRAVENOUS
  Filled 2017-07-21 (×3): qty 50

## 2017-07-21 MED ORDER — SODIUM CHLORIDE 0.9 % IV SOLN
Freq: Once | INTRAVENOUS | Status: AC
Start: 2017-07-21 — End: 2017-07-21
  Administered 2017-07-21: 21:00:00 via INTRAVENOUS

## 2017-07-21 MED ORDER — SIMETHICONE 80 MG PO CHEW: 40.0000 mg | CHEWABLE_TABLET | Freq: Four times a day (QID) | ORAL | Status: DC | PRN

## 2017-07-21 MED ORDER — BUPIVACAINE LIPOSOME 1.3 % IJ SUSP: 20.0000 mL | INTRAMUSCULAR | Status: DC

## 2017-07-21 MED ORDER — MENTHOL 3 MG MT LOZG: 1.0000 | LOZENGE | OROMUCOSAL | Status: DC | PRN

## 2017-07-21 MED ORDER — PHENOL 1.4 % MT LIQD: 1.0000 | OROMUCOSAL | Status: DC | PRN

## 2017-07-21 MED ORDER — ACETAMINOPHEN 500 MG PO TABS: 1000.0000 mg | ORAL_TABLET | ORAL | Status: DC

## 2017-07-21 MED ORDER — MORPHINE SULFATE (PF) 4 MG/ML IV SOLN
4.0000 mg | Freq: Once | INTRAVENOUS | Status: AC
  Administered 2017-07-21: 4 mg via INTRAVENOUS
  Filled 2017-07-21: qty 1

## 2017-07-21 MED ORDER — LACTATED RINGERS IV SOLN
INTRAVENOUS | Status: DC
  Administered 2017-07-21: 22:00:00 via INTRAVENOUS

## 2017-07-21 MED ORDER — HYDROCORTISONE 2.5 % RE CREA: 1.0000 "application " | TOPICAL_CREAM | Freq: Four times a day (QID) | RECTAL | Status: DC | PRN

## 2017-07-21 MED ORDER — MAGIC MOUTHWASH: 15.0000 mL | Freq: Four times a day (QID) | ORAL | Status: DC | PRN

## 2017-07-21 MED ORDER — HYDROMORPHONE HCL 2 MG/ML IJ SOLN: 0.5000 mg | INTRAMUSCULAR | Status: DC | PRN

## 2017-07-21 MED ORDER — LACTATED RINGERS IV BOLUS: 1000.0000 mL | Freq: Three times a day (TID) | INTRAVENOUS | Status: DC | PRN

## 2017-07-21 MED ORDER — NICOTINE 14 MG/24HR TD PT24: 14.0000 mg | MEDICATED_PATCH | Freq: Every day | TRANSDERMAL | Status: DC

## 2017-07-21 MED ORDER — SODIUM CHLORIDE 0.9 % IV SOLN
8.0000 mg | Freq: Four times a day (QID) | INTRAVENOUS | Status: DC | PRN
  Filled 2017-07-21: qty 4

## 2017-07-21 MED ORDER — PSYLLIUM 95 % PO PACK
1.0000 | PACK | Freq: Two times a day (BID) | ORAL | Status: DC
  Filled 2017-07-21: qty 1

## 2017-07-21 MED ORDER — DIPHENHYDRAMINE HCL 12.5 MG/5ML PO ELIX: 12.5000 mg | ORAL_SOLUTION | Freq: Four times a day (QID) | ORAL | Status: DC | PRN

## 2017-07-21 MED ORDER — DEXTROSE 5 % IV SOLN: 1000.0000 mg | Freq: Four times a day (QID) | INTRAVENOUS | Status: DC | PRN

## 2017-07-21 MED ORDER — ONDANSETRON HCL 4 MG/2ML IJ SOLN: 4.0000 mg | Freq: Four times a day (QID) | INTRAMUSCULAR | Status: DC | PRN

## 2017-07-21 MED ORDER — OXYCODONE-ACETAMINOPHEN 5-325 MG PO TABS
1.0000 | ORAL_TABLET | Freq: Once | ORAL | Status: AC
  Administered 2017-07-21: 1 via ORAL
  Filled 2017-07-21: qty 1

## 2017-07-21 MED ORDER — WITCH HAZEL-GLYCERIN EX PADS: 1.0000 "application " | MEDICATED_PAD | CUTANEOUS | Status: DC | PRN

## 2017-07-21 MED ORDER — ENSURE SURGERY PO LIQD: 237.0000 mL | Freq: Two times a day (BID) | ORAL | Status: DC

## 2017-07-21 MED ORDER — METRONIDAZOLE IN NACL 5-0.79 MG/ML-% IV SOLN
500.0000 mg | INTRAVENOUS | Status: AC
  Administered 2017-07-22: 500 mg via INTRAVENOUS
  Filled 2017-07-21: qty 100

## 2017-07-21 MED ORDER — METHOCARBAMOL 500 MG PO TABS: 1000.0000 mg | ORAL_TABLET | Freq: Four times a day (QID) | ORAL | Status: DC | PRN

## 2017-07-21 MED ORDER — IBUPROFEN 400 MG PO TABS: 400.0000 mg | ORAL_TABLET | Freq: Four times a day (QID) | ORAL | Status: DC | PRN

## 2017-07-21 MED ORDER — CELECOXIB 200 MG PO CAPS: 200.0000 mg | ORAL_CAPSULE | ORAL | Status: DC

## 2017-07-21 MED ORDER — SODIUM CHLORIDE 0.9 % IV BOLUS: 1000.0000 mL | Freq: Once | INTRAVENOUS | Status: DC

## 2017-07-21 MED ORDER — LACTATED RINGERS IV BOLUS
1000.0000 mL | Freq: Once | INTRAVENOUS | Status: AC
  Administered 2017-07-21: 1000 mL via INTRAVENOUS

## 2017-07-21 MED ORDER — ACETAMINOPHEN 650 MG RE SUPP: 650.0000 mg | Freq: Four times a day (QID) | RECTAL | Status: DC | PRN

## 2017-07-21 MED ORDER — HYDRALAZINE HCL 20 MG/ML IJ SOLN: 5.0000 mg | INTRAMUSCULAR | Status: DC | PRN

## 2017-07-21 MED ORDER — GUAIFENESIN-DM 100-10 MG/5ML PO SYRP
10.0000 mL | ORAL_SOLUTION | ORAL | Status: DC | PRN
  Filled 2017-07-21: qty 10

## 2017-07-21 MED ORDER — GABAPENTIN 300 MG PO CAPS: 300.0000 mg | ORAL_CAPSULE | ORAL | Status: DC

## 2017-07-21 MED ORDER — METOPROLOL TARTRATE 5 MG/5ML IV SOLN: 5.0000 mg | Freq: Four times a day (QID) | INTRAVENOUS | Status: DC | PRN

## 2017-07-21 MED ORDER — HYDROCORTISONE 1 % EX CREA: 1.0000 "application " | TOPICAL_CREAM | Freq: Three times a day (TID) | CUTANEOUS | Status: DC | PRN

## 2017-07-21 MED ORDER — DIPHENHYDRAMINE HCL 25 MG PO CAPS: 25.0000 mg | ORAL_CAPSULE | Freq: Four times a day (QID) | ORAL | Status: DC | PRN

## 2017-07-21 MED ORDER — IOHEXOL 300 MG/ML  SOLN
100.0000 mL | Freq: Once | INTRAMUSCULAR | Status: AC | PRN
  Administered 2017-07-21: 100 mL via INTRAVENOUS

## 2017-07-21 NOTE — H&P (Signed)
[facility]  [address] [place], [place] [address] Phone: [phone] FAX: [phone]     Corey Hale  04-12-1965 [id number]  CARE TEAM:  PCP: Tresa Garter, MD  Outpatient Care Team: Patient Care Team: Tresa Garter, MD as PCP - General (Internal Medicine)  Inpatient Treatment Team: Treatment Team: Attending Provider: Daleen Bo, MD; Physician Assistant: Robinson, [place] N, PA-C; Registered Nurse: Durward Mallard, RN   This patient is a 52 y.o.male who presents today for surgical evaluation at the request of Dr Eulis Foster, [facility] ED.   Chief complaint / Reason for evaluation: Recurrent perirectal abscess.  Pleasant gentleman originally from the [place] whose had a history of skin infections and perirectal abscesses.  He has had some swelling that improved on antibiotics such as Bactrim.  However, he did have a worsening abscess that required incision and drainage in the emergency room last year.  Seem to close up.  Not followed up with [place] afterwards.  Patient with recurrent pain and swelling over the past 3 days.  Much more painful.  Came in for evaluation.  Recurrent perirectal abscess suspected and confirmed by CAT scan.  Surgical consultation requested.  Patient does smoke.  Is not diabetic.  Does not recall any history of MRSA or HIV.  Normally moves his bowels every day.  No personal nor family history of GI/colon cancer, inflammatory bowel disease, irritable bowel syndrome, allergy such as Celiac Sprue, dietary/dairy problems, colitis, ulcers nor gastritis.  No recent sick contacts/gastroenteritis.  No travel outside the country.  No changes in diet.  No dysphagia to solids or liquids.  No significant heartburn or reflux.  No hematochezia, hematemesis, coffee ground emesis.  No evidence of prior gastric/peptic ulceration.    Assessment  Corey Hale  52 y.o. male       Problem List:  Principal  Problem:   Perirectal abscess Active Problems:   History of abscess of skin and subcutaneous tissue   Tobacco abuse   Chronic back pain   Recurrent perirectal abscess of moderate size.  Plan:  I think this requires incision and drainage in the operating room to rule a chronic fistula.  Plan tomorrow morning Dr [name].  Lithotomy positioning.  The anatomy and physiology of the region was discussed. The pathophysiology of subcutaneous abscess formation with progression to fasciitis & sepsis was discussed.  Need for incision, drainage, debridement discussed.  I stressed good hygiene & need for repeated wound care.  Possible redebridement / reoperation was discussed as well. Possibility of recurrence was discussed.   Risks of bleeding, infection, abscess, leak, injury to other organs, need for repair of tissues / organs, need for further treatment, heart attack, death, and other risks were discussed.  Benefits, alternatives were discussed. I noted a good likelihood this will help address the problem.  Questions answered.  The patient agrees to proceed.   IV antibiotics.  Rule out HIV.  Quit smoking.  STOP SMOKING! We talked to the patient about the dangers of smoking.  We stressed that tobacco use dramatically increases the risk of peri-operative complications such as infection, tissue necrosis leaving to problems with incision/wound and organ healing, hernia, chronic pain, heart attack, stroke, DVT, pulmonary embolism, and death.  We noted there are programs in our community to help stop smoking.  Information was available.   -VTE prophylaxis- SCDs, etc -mobilize as tolerated to help recovery  35 minutes spent in review, evaluation, examination, counseling,  and coordination of care.  More than 50% of that time was spent in counseling.  Adin Hector, M.D., F.A.C.S. Gastrointestinal and Minimally Invasive Surgery [facility] [address] [place], [place] [address] [phone] Main / Paging   07/21/2017      Past Medical History:  Diagnosis Date  . Chronic back pain   . Hypercholesteremia   . Hypertension   . Thyroid disease     Past Surgical History:  Procedure Laterality Date  . ARM HARDWARE REMOVAL Right   . NECK SURGERY      Social History   Socioeconomic History  . Marital status: Married    Spouse name: Not on file  . Number of children: Not on file  . Years of education: Not on file  . Highest education level: Not on file  Occupational History  . Not on file  Social Needs  . Financial resource strain: Not on file  . Food insecurity:    Worry: Not on file    Inability: Not on file  . Transportation needs:    Medical: Not on file    Non-medical: Not on file  Tobacco Use  . Smoking status: Current Every Day Smoker    Packs/day: 0.25    Last attempt to quit: 03/23/2015    Years since quitting: 2.3  . Smokeless tobacco: Never Used  Substance and Sexual Activity  . Alcohol use: No    Alcohol/week: 0.0 oz  . Drug use: No  . Sexual activity: Not on file  Lifestyle  . Physical activity:    Days per week: Not on file    Minutes per session: Not on file  . Stress: Not on file  Relationships  . Social connections:    Talks on phone: Not on file    Gets together: Not on file    Attends religious service: Not on file    Active member of club or organization: Not on file    Attends meetings of clubs or organizations: Not on file    Relationship status: Not on file  . Intimate partner violence:    Fear of current or ex partner: Not on file    Emotionally abused: Not on file    Physically abused: Not on file    Forced sexual activity: Not on file  Other Topics Concern  . Not on file  Social History Narrative   ** Merged History Encounter **        Family History  Problem Relation Age of Onset  . Stroke Mother   . Stroke Father     Current Facility-Administered Medications   Medication Dose Route Frequency Provider Last Rate Last Dose  . 0.9 %  sodium chloride infusion   Intravenous Once Quentin Cornwall, [place] N, PA-C      . piperacillin-tazobactam (ZOSYN) IVPB 3.375 g  3.375 g Intravenous Q8H Wynell Balloon, RPH 12.5 mL/hr at 07/21/17 1910 3.375 g at 07/21/17 1910   Current Outpatient Medications  Medication Sig Dispense Refill  . aspirin 81 MG tablet Take 1 tablet (81 mg total) by mouth daily. 90 tablet 3  . atenolol-chlorthalidone (TENORETIC) 50-25 MG tablet Take 1 tablet by mouth daily. 90 tablet 3  . carbamide peroxide (DEBROX) 6.5 % OTIC solution Place 5 drops into both ears 2 (two) times daily. 15 mL 0  . doxycycline (VIBRAMYCIN) 100 MG capsule Take 1 capsule (100 mg total) by mouth 2 (two) times daily. 20 capsule 0  .  hydrocortisone cream 1 % Apply 1 application topically 2 (two) times daily. 30 g 2  . ibuprofen (ADVIL,MOTRIN) 800 MG tablet Take 1 tablet (800 mg total) by mouth every 8 (eight) hours as needed. 21 tablet 0  . pravastatin (PRAVACHOL) 20 MG tablet Take 0.5 tablets (10 mg total) by mouth daily. 30 tablet 2  . sildenafil (VIAGRA) 100 MG tablet Take 0.5-1 tablets (50-100 mg total) by mouth daily as needed for erectile dysfunction. 30 tablet 3  . Talc-Starch (LAMISIL AT) POWD Apply to affected area (left Foot) twice a day. 100 g 1  . triamcinolone cream (KENALOG) 0.1 % Apply 1 application topically 2 (two) times daily. 30 g 1  . valACYclovir (VALTREX) 1000 MG tablet TAKE 1 TABLET BY MOUTH DAILY 30 tablet 12     No Known Allergies  ROS:   All other systems reviewed & are negative except per HPI or as noted below: Constitutional:  No fevers, chills, sweats.  Weight stable Eyes:  No vision changes, No discharge HENT:  No sore throats, nasal drainage Lymph: No neck swelling, No bruising easily Pulmonary:  No cough, productive sputum CV: No orthopnea, PND  Patient walks 30 minutes for about 1 miles without difficulty.  No exertional  chest/neck/shoulder/arm pain. GI: No personal nor family history of GI/colon cancer, inflammatory bowel disease, irritable bowel syndrome, allergy such as Celiac Sprue, dietary/dairy problems, colitis, ulcers nor gastritis.  No recent sick contacts/gastroenteritis.  No travel outside the country.  No changes in diet. Renal: No UTIs, No hematuria Genital:  No drainage, bleeding, masses Musculoskeletal: No severe joint pain.  Good ROM major joints Skin:  No sores or lesions.  No rashes Heme/Lymph:  No easy bleeding.  No swollen lymph nodes Neuro: No focal weakness/numbness.  No seizures Psych: No suicidal ideation.  No hallucinations  BP 122/84 (BP Location: Left Arm)   Pulse 91   Temp (!) 100.7 F (38.2 C) (Oral)   Resp 16   SpO2 100%   Physical Exam: General: Pt awake/alert/oriented x4 in no major acute distress Eyes: PERRL, normal EOM. Sclera nonicteric Neuro: CN II-XII intact w/o focal sensory/motor deficits. Lymph: No head/neck/groin lymphadenopathy Psych:  No delerium/psychosis/paranoia HENT: Normocephalic, Mucus membranes moist.  No thrush Neck: Supple, No tracheal deviation Chest: No pain.  Good respiratory excursion. CV:  Pulses intact.  Regular rhythm Abdomen: Soft, Nondistended.  Nontender.  No incarcerated hernias. Gen:  No inguinal hernias.  No inguinal lymphadenopathy.    Rectal 7 x 6 cm area of induration and swelling in left buttock with old scarring.  Very tender and sensitive to palpation.  Held off on digital exam.  No right-sided fissure.  No obvious thrombosed hemorrhoid.  Mildly increased sphincter tone.  Ext:  SCDs BLE.  No significant edema.  No cyanosis Skin: No petechiae / purpurea.  No major sores Musculoskeletal: No severe joint pain.  Good ROM major joints   Results:   Labs: Results for orders placed or performed during the hospital encounter of 07/21/17 (from the past 48 hour(s))  CBC with Differential     Status: Abnormal   Collection Time:  07/21/17  4:20 PM  Result Value Ref Range   WBC 13.4 (H) 4.0 - 10.5 K/uL   RBC 4.86 4.22 - 5.81 MIL/uL   Hemoglobin 14.5 13.0 - 17.0 g/dL   HCT 43.1 39.0 - 52.0 %   MCV 88.7 78.0 - 100.0 fL   MCH 29.8 26.0 - 34.0 pg   MCHC 33.6 30.0 - 36.0  g/dL   RDW 13.8 11.5 - 15.5 %   Platelets 235 150 - 400 K/uL   Neutrophils Relative % 74 %   Neutro Abs 9.9 (H) 1.7 - 7.7 K/uL   Lymphocytes Relative 14 %   Lymphs Abs 1.9 0.7 - 4.0 K/uL   Monocytes Relative 10 %   Monocytes Absolute 1.3 (H) 0.1 - 1.0 K/uL   Eosinophils Relative 2 %   Eosinophils Absolute 0.2 0.0 - 0.7 K/uL   Basophils Relative 0 %   Basophils Absolute 0.0 0.0 - 0.1 K/uL    Comment: Performed at [facility] [address], [place], [place] [address]  Basic metabolic panel     Status: Abnormal   Collection Time: 07/21/17  4:20 PM  Result Value Ref Range   Sodium 139 135 - 145 mmol/L   Potassium 3.0 (L) 3.5 - 5.1 mmol/L   Chloride 98 (L) 101 - 111 mmol/L   CO2 31 22 - 32 mmol/L   Glucose, Bld 85 65 - 99 mg/dL   BUN 10 6 - 20 mg/dL   Creatinine, Ser 0.78 0.61 - 1.24 mg/dL   Calcium 9.3 8.9 - 10.3 mg/dL   GFR calc non Af Amer >60 >60 mL/min   GFR calc Af Amer >60 >60 mL/min    Comment: (NOTE) The eGFR has been calculated using the CKD EPI equation. This calculation has not been validated in all clinical situations. eGFR's persistently <60 mL/min signify possible Chronic Kidney Disease.    Anion gap 10 5 - 15    Comment: Performed at [facility] [address], [place], [place] [address]    Imaging / Studies: Dg Knee 2 Views Left  Result Date: 07/13/2017 CLINICAL DATA:  Pt. States swelling around total left knee x one month; states pain anteriorly and posteriorly EXAM: LEFT KNEE - 1-2 VIEW COMPARISON:  04/17/2017 FINDINGS: No acute fracture. Old healed fracture of the proximal fibular shaft, stable. Mild narrowing of the medial joint space compartment with small associated marginal osteophytes. Remaining  joint space compartments are well preserved. No other arthropathic change. No convincing joint effusion.  Soft tissues are unremarkable. IMPRESSION: 1. No fracture or acute finding. 2. 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 3. No convincing joint effusion. Electronically Signed   By: Lajean Manes M.D.   On: 07/13/2017 20:11   Ct Pelvis W Contrast  Result Date: 07/21/2017 CLINICAL DATA:  Left buttock boil with pain. EXAM: CT PELVIS WITH CONTRAST TECHNIQUE: Multidetector CT imaging of the pelvis was performed using the standard protocol following the bolus administration of intravenous contrast. CONTRAST:  177m OMNIPAQUE IOHEXOL 300 MG/ML  SOLN COMPARISON:  None. FINDINGS: Urinary Tract:  Negative. Bowel: Visualized portions of the small bowel, appendix and colon are unremarkable. Vascular/Lymphatic: Atherosclerotic calcification. No pathologically enlarged lymph nodes. Reproductive:  Prostate is visualized. Other: An irregular low-density fluid collection is seen deep to the left gluteal fold, measuring approximately 3.5 x 3.9 x 6.1 cm. There is a tract to the skin surface. Surrounding inflammatory haziness and stranding. Musculoskeletal: Well-circumscribed sclerotic lesion with lucent center in the right iliac wing is nonspecific but has a benign imaging appearance. IMPRESSION: Fluid collection deep to the left gluteal fold with extensive surrounding inflammatory changes and is worrisome for an abscess. Associated tract to the skin surface. Electronically Signed   By: MLorin PicketM.D.   On: 07/21/2017 18:29   [place]ous Img Lower  Left (dvt Study)  Result Date: 07/13/2017  CLINICAL DATA:  Left knee pain and swelling for 1 month. EXAM: LEFT LOWER EXTREMITY VENOUS DOPPLER ULTRASOUND TECHNIQUE: Gray-scale sonography with graded compression, as well as color Doppler and duplex ultrasound were performed to evaluate the lower extremity deep venous systems from the level of the common  femoral vein and including the common femoral, femoral, profunda femoral, popliteal and calf veins including the posterior tibial, peroneal and gastrocnemius veins when visible. The superficial great saphenous vein was also interrogated. Spectral Doppler was utilized to evaluate flow at rest and with distal augmentation maneuvers in the common femoral, femoral and popliteal veins. COMPARISON:  None. FINDINGS: Contralateral Common Femoral Vein: Respiratory phasicity is normal and symmetric with the symptomatic side. No evidence of thrombus. Normal compressibility. Common Femoral Vein: No evidence of thrombus. Normal compressibility, respiratory phasicity and response to augmentation. Saphenofemoral Junction: No evidence of thrombus. Normal compressibility and flow on color Doppler imaging. Profunda Femoral Vein: No evidence of thrombus. Normal compressibility and flow on color Doppler imaging. Femoral Vein: No evidence of thrombus. Normal compressibility, respiratory phasicity and response to augmentation. Popliteal Vein: No evidence of thrombus. Normal compressibility, respiratory phasicity and response to augmentation. Calf Veins: No evidence of thrombus. Normal compressibility and flow on color Doppler imaging. Superficial Great Saphenous Vein: No evidence of thrombus. Normal compressibility. Venous Reflux:  None. Other Findings: Complex popliteal cyst measuring 6 cm in longest dimension. Small knee joint effusion. This was not apparent on the current radiographs. IMPRESSION: 1. No evidence of deep venous thrombosis. 2. 6 cm complex Baker's cyst.  Small left knee joint effusion. Electronically Signed   By: Lajean Manes M.D.   On: 07/13/2017 20:54    Medications / Allergies: per chart  Antibiotics: Anti-infectives (From admission, onward)   Start     Dose/Rate Route Frequency Ordered Stop   07/21/17 2000  piperacillin-tazobactam (ZOSYN) IVPB 3.375 g     3.375 g 12.5 mL/hr over 240 Minutes Intravenous  Every 8 hours 07/21/17 1854          Note: Portions of this report may have been transcribed using voice recognition software. Every effort was made to ensure accuracy; however, inadvertent computerized transcription errors may be present.   Any transcriptional errors that result from this process are unintentional.    Adin Hector, M.D., F.A.C.S. Gastrointestinal and Minimally Invasive Surgery [facility] [address] [place], [place] [address] [phone] Main / Paging   07/21/2017

## 2017-07-21 NOTE — Progress Notes (Signed)
Pharmacy Antibiotic Note  Corey Hale is a 52 y.o. male admitted on 07/21/2017 with cellulitis.  Pharmacy has been consulted for zosyn dosing. Zosyn 3.375 gm q8 hours ordered  Plan: Cont zosyn as ordered     Temp (24hrs), Avg:99.1 F (37.3 C), Min:98.3 F (36.8 C), Max:100.7 F (38.2 C)  Recent Labs  Lab 07/21/17 1620  WBC 13.4*  CREATININE 0.78    Estimated Creatinine Clearance: 123.5 mL/min (by C-G formula based on SCr of 0.78 mg/dL).    No Known Allergies   Thank you for allowing pharmacy to be a part of this patient's care.  Beverlee Nims 07/21/2017 9:38 PM

## 2017-07-21 NOTE — ED Triage Notes (Signed)
Pt reports he has a boil on his buttocks x3 days, reports it was I&D'ed a few years ago but has since returned. Reports some chills at home.

## 2017-07-21 NOTE — ED Provider Notes (Signed)
[facility] EMERGENCY DEPARTMENT Provider Note   CSN: [id number] Arrival date & time: 07/21/17  1254     History   Chief Complaint Chief Complaint  Patient presents with  . Recurrent Skin Infections    HPI Corey Hale is a 52 y.o. male past medical history of hypertension, presenting to the ED with 3 days of worsening pain to the left buttock.  Patient states pain is worse with palpation and movement.  States it feels similar to previous history of abscess which he has had drained previously in the ED.  Reports associated subjective fever and chills.  States area has not been draining.  Taking ibuprofen without relief.  Denies history of immunocompromise or diabetes, or IBD.  The history is provided by the patient.    Past Medical History:  Diagnosis Date  . Chronic back pain   . Hypercholesteremia   . Hypertension   . Thyroid disease     Patient Active Problem List   Diagnosis Date Noted  . Hyperlipidemia 07/21/2017  . History of abscess of skin and subcutaneous tissue 07/21/2017  . Perirectal abscess 07/21/2017  . Herpes exposure 03/28/2016  . Chronic back pain 05/30/2015  . Mid back pain, chronic 05/26/2015  . Chronic midline low back pain without sciatica 05/26/2015  . Midline thoracic back pain 03/07/2015  . Back pain at L4-L5 level 07/06/2014  . History of vertebral fracture 05/20/2014  . Hypertension 02/23/2014  . 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 Hearing loss 09/07/2013  . Dental caries 06/10/2012  . Erectile dysfunction 06/10/2012  . Tobacco abuse 06/10/2012  . Dyslipidemia 06/10/2012  . Personal history of goiter 06/10/2012  . Genital herpes 03/05/2012  . Thoracic back pain 07/21/2009  . THYROID NODULE, HX OF 07/21/2009    Past Surgical History:  Procedure Laterality Date  . ARM HARDWARE REMOVAL Right   . NECK SURGERY    . PAROTIDECTOMY     Dr Redmond Baseman  . THYROIDECTOMY, PARTIAL  2008   Dr Marlou Starks        Home Medications    Prior  to Admission medications   Medication Sig Start Date End Date Taking? Authorizing Provider  aspirin 81 MG tablet Take 1 tablet (81 mg total) by mouth daily. 03/07/15   Tresa Garter, MD  atenolol-chlorthalidone (TENORETIC) 50-25 MG tablet Take 1 tablet by mouth daily. 04/10/17   Tresa Garter, MD  carbamide peroxide (DEBROX) 6.5 % OTIC solution Place 5 drops into both ears 2 (two) times daily. 04/10/17   Tresa Garter, MD  doxycycline (VIBRAMYCIN) 100 MG capsule Take 1 capsule (100 mg total) by mouth 2 (two) times daily. 06/09/17   Muthersbaugh, Jarrett Soho, PA-C  hydrocortisone cream 1 % Apply 1 application topically 2 (two) times daily. 05/15/17   Tresa Garter, MD  ibuprofen (ADVIL,MOTRIN) 800 MG tablet Take 1 tablet (800 mg total) by mouth every 8 (eight) hours as needed. 07/13/17   Long, Wonda Olds, MD  pravastatin (PRAVACHOL) 20 MG tablet Take 0.5 tablets (10 mg total) by mouth daily. 04/10/17   Tresa Garter, MD  sildenafil (VIAGRA) 100 MG tablet Take 0.5-1 tablets (50-100 mg total) by mouth daily as needed for erectile dysfunction. 04/10/17   Tresa Garter, MD  Talc-Starch (LAMISIL AT) POWD Apply to affected area (left Foot) twice a day. 05/15/17   Tresa Garter, MD  triamcinolone cream (KENALOG) 0.1 % Apply 1 application topically 2 (two) times daily. 04/04/17   Ward, Ozella Almond,  PA-C  valACYclovir (VALTREX) 1000 MG tablet TAKE 1 TABLET BY MOUTH DAILY 05/09/17   Tresa Garter, MD    Family History Family History  Problem Relation Age of Onset  . Stroke Mother   . Stroke Father     Social History Social History   Tobacco Use  . Smoking status: Current Every Day Smoker    Packs/day: 0.50    Types: Cigarettes    Last attempt to quit: 03/23/2015    Years since quitting: 2.3  . Smokeless tobacco: Never Used  Substance Use Topics  . Alcohol use: No    Alcohol/week: 0.0 oz    Frequency: Never  . Drug use: No     Allergies   Patient  has no known allergies.   Review of Systems Review of Systems  Constitutional: Positive for chills and fever (Subjective).  Gastrointestinal: Negative for abdominal pain.  Skin: Positive for color change.  Allergic/Immunologic: Negative for immunocompromised state.     Physical Exam Updated Vital Signs BP 115/74 (BP Location: Left Arm)   Pulse 92   Temp 98.9 F (37.2 C) (Oral)   Resp 16   SpO2 100%   Physical Exam  Constitutional: He appears well-developed and well-nourished. No distress.  HENT:  Head: Normocephalic and atraumatic.  Eyes: Conjunctivae are normal.  Cardiovascular: Normal rate and intact distal pulses.  Pulmonary/Chest: Effort normal.  Abdominal: Soft. Bowel sounds are normal. He exhibits no distension. There is no tenderness.  Genitourinary:  Genitourinary Comments: Exam performed with RN chaperone present.  Left lower gluteal cleft with erythema, induration and tenderness.  No palpable fluctuance noted.  Erythema and induration does extend near anus.  No drainage.  Neurological: He is alert.  Skin: Skin is warm.  Psychiatric: He has a normal mood and affect. His behavior is normal.  Nursing note and vitals reviewed.    ED Treatments / Results  Labs (all labs ordered are listed, but only abnormal results are displayed) Labs Reviewed  CBC WITH DIFFERENTIAL/PLATELET - Abnormal; Notable for the following components:      Result Value   WBC 13.4 (*)    Neutro Abs 9.9 (*)    Monocytes Absolute 1.3 (*)    All other components within normal limits  BASIC METABOLIC PANEL - Abnormal; Notable for the following components:   Potassium 3.0 (*)    Chloride 98 (*)    All other components within normal limits  HIV ANTIBODY (ROUTINE TESTING)    EKG None  Radiology Ct Pelvis W Contrast  Result Date: 07/21/2017 CLINICAL DATA:  Left buttock boil with pain. EXAM: CT PELVIS WITH CONTRAST TECHNIQUE: Multidetector CT imaging of the pelvis was performed using the  standard protocol following the bolus administration of intravenous contrast. CONTRAST:  174mL OMNIPAQUE IOHEXOL 300 MG/ML  SOLN COMPARISON:  None. FINDINGS: Urinary Tract:  Negative. Bowel: Visualized portions of the small bowel, appendix and colon are unremarkable. Vascular/Lymphatic: Atherosclerotic calcification. No pathologically enlarged lymph nodes. Reproductive:  Prostate is visualized. Other: An irregular low-density fluid collection is seen deep to the left gluteal fold, measuring approximately 3.5 x 3.9 x 6.1 cm. There is a tract to the skin surface. Surrounding inflammatory haziness and stranding. Musculoskeletal: Well-circumscribed sclerotic lesion with lucent center in the right iliac wing is nonspecific but has a benign imaging appearance. IMPRESSION: Fluid collection deep to the left gluteal fold with extensive surrounding inflammatory changes and is worrisome for an abscess. Associated tract to the skin surface. Electronically Signed   By: Rip Harbour  Blietz M.D.   On: 07/21/2017 18:29    Procedures Procedures (including critical care time) EMERGENCY DEPARTMENT US SOFT TISSUE INTERPRETATION "Study: Limited Soft Tissue Ultrasound"  INDICATIONS: Pain and Soft tissue infection Multiple views of the body part were obtained in real-time with a multi-frequency linear probe  PERFORMED BY: Myself IMAGES ARCHIVED?: Yes SIDE:Midline BODY PART:peri-anal INTERPRETATION:  Abcess present and Cellulitis present     Medications Ordered in ED Medications  piperacillin-tazobactam (ZOSYN) IVPB 3.375 g (0 g Intravenous Stopped 07/21/17 2120)  Chlorhexidine Gluconate Cloth 2 % PADS 6 each (has no administration in time range)    And  Chlorhexidine Gluconate Cloth 2 % PADS 6 each (has no administration in time range)  cefTRIAXone (ROCEPHIN) 2 g in sodium chloride 0.9 % 100 mL IVPB (has no administration in time range)    And  metroNIDAZOLE (FLAGYL) IVPB 500 mg (has no administration in time range)    gabapentin (NEURONTIN) capsule 300 mg (has no administration in time range)  acetaminophen (TYLENOL) tablet 1,000 mg (has no administration in time range)  celecoxib (CELEBREX) capsule 200 mg (has no administration in time range)  enoxaparin (LOVENOX) injection 40 mg (has no administration in time range)  lactated ringers infusion (has no administration in time range)  acetaminophen (TYLENOL) tablet 650 mg (has no administration in time range)    Or  acetaminophen (TYLENOL) suppository 650 mg (has no administration in time range)  diphenhydrAMINE (BENADRYL) 12.5 MG/5ML elixir 12.5 mg (has no administration in time range)    Or  diphenhydrAMINE (BENADRYL) injection 12.5 mg (has no administration in time range)  ondansetron (ZOFRAN-ODT) disintegrating tablet 4 mg (has no administration in time range)    Or  ondansetron (ZOFRAN) injection 4 mg (has no administration in time range)  simethicone (MYLICON) chewable tablet 40 mg (has no administration in time range)  bupivacaine liposome (EXPAREL) 1.3 % injection 266 mg (has no administration in time range)  lactated ringers bolus 1,000 mL (has no administration in time range)  lactated ringers bolus 1,000 mL (has no administration in time range)  HYDROmorphone (DILAUDID) injection 0.5-2 mg (has no administration in time range)  oxyCODONE (Oxy IR/ROXICODONE) immediate release tablet 5-10 mg (has no administration in time range)  ibuprofen (ADVIL,MOTRIN) tablet 400-800 mg (has no administration in time range)  methocarbamol (ROBAXIN) 1,000 mg in dextrose 5 % 50 mL IVPB (has no administration in time range)  methocarbamol (ROBAXIN) tablet 1,000 mg (has no administration in time range)  ondansetron (ZOFRAN) injection 4 mg (has no administration in time range)    Or  ondansetron (ZOFRAN) 8 mg in sodium chloride 0.9 % 50 mL IVPB (has no administration in time range)  prochlorperazine (COMPAZINE) injection 5-10 mg (has no administration in time range)   lip balm (CARMEX) ointment 1 application (has no administration in time range)  magic mouthwash (has no administration in time range)  witch hazel-glycerin (TUCKS) pad 1 application (has no administration in time range)  psyllium (HYDROCIL/METAMUCIL) packet 1 packet (has no administration in time range)  feeding supplement (ENSURE SURGERY) liquid 237 mL (has no administration in time range)  nicotine (NICODERM CQ - dosed in mg/24 hours) patch 14 mg (has no administration in time range)  guaiFENesin-dextromethorphan (ROBITUSSIN DM) 100-10 MG/5ML syrup 10 mL (has no administration in time range)  hydrocortisone (ANUSOL-HC) 2.5 % rectal cream 1 application (has no administration in time range)  alum & mag hydroxide-simeth (MAALOX/MYLANTA) 200-200-20 MG/5ML suspension 30 mL (has no administration in time range)  hydrocortisone  cream 1 % 1 application (has no administration in time range)  menthol-cetylpyridinium (CEPACOL) lozenge 3 mg (has no administration in time range)  phenol (CHLORASEPTIC) mouth spray 1-2 spray (has no administration in time range)  diphenhydrAMINE (BENADRYL) capsule 25 mg (has no administration in time range)  diphenhydrAMINE (BENADRYL) injection 12.5-25 mg (has no administration in time range)  metoprolol tartrate (LOPRESSOR) injection 5 mg (has no administration in time range)  hydrALAZINE (APRESOLINE) injection 5-10 mg (has no administration in time range)  oxyCODONE-acetaminophen (PERCOCET/ROXICET) 5-325 MG per tablet 1 tablet (1 tablet Oral Given 07/21/17 1609)  iohexol (OMNIPAQUE) 300 MG/ML solution 100 mL (100 mLs Intravenous Contrast Given 07/21/17 1733)  morphine 4 MG/ML injection 4 mg (4 mg Intravenous Given 07/21/17 1948)  acetaminophen (TYLENOL) tablet 650 mg (650 mg Oral Given 07/21/17 1948)  0.9 %  sodium chloride infusion ( Intravenous Stopped 07/21/17 2131)     Initial Impression / Assessment and Plan / ED Course  I have reviewed the triage vital signs and the  nursing notes.  Pertinent labs & imaging results that were available during my care of the patient were reviewed by me and considered in my medical decision making (see chart for details).  Clinical Course as of Jul 21 2129  Nancy Fetter Jul 21, 2017  1924 Spoke with Dr. Donne Hazel with surgery. To evaluate patient when available.   [JR]    Clinical Course User Index [JR] Ahman Dugdale, [place] N, PA-C    Patient presenting to the ED with 3 days of pain and swelling to buttock, near anus on the left side.  Reports subjective fevers and chills at home.  History of abscess to buttock.  No history of IBD or immunocompromise.  Exam with induration and erythema post proximity to anus.  No palpable fluctuance on exam.  Bedside ultrasound performed, with 2cm deep fluid collection which appears to extend towards anus.  For this reason, labs and CT pelvis ordered to evaluate for deep space infection.  CT revealing deep fluid collection surrounding inflammation in close proximity to rectum. CBC with leukocytosis. Discussed these results with radiologist, who is unable to confirm if fluid collection involves rectum or tracks just next to.  Patient discussed with Dr. Eulis Foster.  Zosyn ordered and recommend admission for surgery intervention.  Surgery to evaluate patient once available.  In interim, patient did develop a low-grade fever 100.7 F. HR and BP remain normal.  Tylenol ordered and another dose of pain medicine.  Patient aware of plan for surgery evaluation, and aware of delay. Agreeable to admission at this time. Instructed to remain NPO.  Temp improved with tylenol.  Dr. Johney Maine evaluated pt and and will admit with plan for OR tomorrow. Pt allowed to drink liquids until midnight, then NPO at midnight. Pt agreeable to plan.  The patient appears reasonably stabilized for admission considering the current resources, flow, and capabilities available in the ED at this time, and I doubt any other [facility] requiring further  screening and/or treatment in the ED prior to admission.  Final Clinical Impressions(s) / ED Diagnoses   Final diagnoses:  Perirectal abscess    ED Discharge Orders    None       Kyrra Prada, [place] N, PA-C 07/21/17 2132    Daleen Bo, MD 07/21/17 2153

## 2017-07-21 NOTE — ED Notes (Signed)
Dr. Johney Maine evaluated pt. and explained admission/plan of care to pt.

## 2017-07-22 ENCOUNTER — Inpatient Hospital Stay (HOSPITAL_COMMUNITY): Payer: Medicaid Other | Admitting: Anesthesiology

## 2017-07-22 ENCOUNTER — Encounter (HOSPITAL_COMMUNITY): Admission: EM | Disposition: A | Discharge status: Home / Self Care | Payer: Self-pay | Source: Home / Self Care

## 2017-07-22 HISTORY — PX: INCISION AND DRAINAGE PERIRECTAL ABSCESS: SHX1804

## 2017-07-22 LAB — HIV ANTIBODY (ROUTINE TESTING W REFLEX): HIV Screen 4th Generation wRfx: NONREACTIVE

## 2017-07-22 SURGERY — INCISION AND DRAINAGE, ABSCESS, PERIRECTAL
Anesthesia: General | Site: Rectum

## 2017-07-22 MED ORDER — MIDAZOLAM HCL 5 MG/5ML IJ SOLN
INTRAMUSCULAR | Status: DC | PRN
  Administered 2017-07-22: 2 mg via INTRAVENOUS

## 2017-07-22 MED ORDER — DEXAMETHASONE SODIUM PHOSPHATE 10 MG/ML IJ SOLN
INTRAMUSCULAR | Status: DC | PRN
  Administered 2017-07-22: 8 mg via INTRAVENOUS

## 2017-07-22 MED ORDER — METHOCARBAMOL 500 MG PO TABS
500.0000 mg | ORAL_TABLET | Freq: Four times a day (QID) | ORAL | Status: DC | PRN
  Filled 2017-07-22: qty 1

## 2017-07-22 MED ORDER — HYDROMORPHONE HCL 2 MG/ML IJ SOLN
INTRAMUSCULAR | Status: AC
  Filled 2017-07-22: qty 1

## 2017-07-22 MED ORDER — PIPERACILLIN-TAZOBACTAM 3.375 G IVPB
3.3750 g | Freq: Three times a day (TID) | INTRAVENOUS | Status: DC
Start: 2017-07-22 — End: 2017-07-24
  Administered 2017-07-22 – 2017-07-24 (×5): 3.375 g via INTRAVENOUS
  Filled 2017-07-22 (×6): qty 50

## 2017-07-22 MED ORDER — 0.9 % SODIUM CHLORIDE (POUR BTL) OPTIME
TOPICAL | Status: DC | PRN
  Administered 2017-07-22: 1000 mL

## 2017-07-22 MED ORDER — MEPERIDINE HCL 50 MG/ML IJ SOLN: 6.2500 mg | INTRAMUSCULAR | Status: DC | PRN

## 2017-07-22 MED ORDER — PROMETHAZINE HCL 25 MG/ML IJ SOLN: 12.5000 mg | Freq: Four times a day (QID) | INTRAMUSCULAR | Status: DC | PRN

## 2017-07-22 MED ORDER — DIPHENHYDRAMINE HCL 50 MG/ML IJ SOLN: 12.5000 mg | Freq: Four times a day (QID) | INTRAMUSCULAR | Status: DC | PRN

## 2017-07-22 MED ORDER — ATENOLOL-CHLORTHALIDONE 50-25 MG PO TABS: 1.0000 | ORAL_TABLET | Freq: Every day | ORAL | Status: DC

## 2017-07-22 MED ORDER — SODIUM CHLORIDE 0.9 % IV SOLN
INTRAVENOUS | Status: DC
  Administered 2017-07-22: 09:00:00 via INTRAVENOUS

## 2017-07-22 MED ORDER — PHENYLEPHRINE HCL 10 MG/ML IJ SOLN
INTRAMUSCULAR | Status: DC | PRN
  Administered 2017-07-22: 80 ug via INTRAVENOUS

## 2017-07-22 MED ORDER — LIDOCAINE HCL (CARDIAC) PF 100 MG/5ML IV SOSY
PREFILLED_SYRINGE | INTRAVENOUS | Status: DC | PRN
  Administered 2017-07-22: 100 mg via INTRATRACHEAL

## 2017-07-22 MED ORDER — ACETAMINOPHEN 500 MG PO TABS
1000.0000 mg | ORAL_TABLET | Freq: Four times a day (QID) | ORAL | Status: DC
  Administered 2017-07-22 – 2017-07-24 (×7): 1000 mg via ORAL
  Filled 2017-07-22 (×8): qty 2

## 2017-07-22 MED ORDER — FENTANYL CITRATE (PF) 250 MCG/5ML IJ SOLN
INTRAMUSCULAR | Status: AC
  Filled 2017-07-22: qty 5

## 2017-07-22 MED ORDER — MIDAZOLAM HCL 2 MG/2ML IJ SOLN
INTRAMUSCULAR | Status: AC
  Filled 2017-07-22: qty 2

## 2017-07-22 MED ORDER — ONDANSETRON HCL 4 MG/2ML IJ SOLN: 4.0000 mg | Freq: Once | INTRAMUSCULAR | Status: DC | PRN

## 2017-07-22 MED ORDER — ENOXAPARIN SODIUM 40 MG/0.4ML ~~LOC~~ SOLN
40.0000 mg | SUBCUTANEOUS | Status: DC
  Filled 2017-07-22 (×2): qty 0.4

## 2017-07-22 MED ORDER — GABAPENTIN 100 MG PO CAPS
200.0000 mg | ORAL_CAPSULE | Freq: Two times a day (BID) | ORAL | Status: DC
  Administered 2017-07-22 – 2017-07-24 (×4): 200 mg via ORAL
  Filled 2017-07-22 (×5): qty 2

## 2017-07-22 MED ORDER — HYDROMORPHONE HCL 2 MG/ML IJ SOLN
0.2500 mg | INTRAMUSCULAR | Status: DC | PRN
  Administered 2017-07-22 (×4): 0.5 mg via INTRAVENOUS

## 2017-07-22 MED ORDER — DOCUSATE SODIUM 100 MG PO CAPS
100.0000 mg | ORAL_CAPSULE | Freq: Two times a day (BID) | ORAL | Status: DC
  Administered 2017-07-22 – 2017-07-24 (×3): 100 mg via ORAL
  Filled 2017-07-22 (×4): qty 1

## 2017-07-22 MED ORDER — CHLORTHALIDONE 25 MG PO TABS
25.0000 mg | ORAL_TABLET | Freq: Every day | ORAL | Status: DC
  Administered 2017-07-22 – 2017-07-24 (×3): 25 mg via ORAL
  Filled 2017-07-22 (×3): qty 1

## 2017-07-22 MED ORDER — KETOROLAC TROMETHAMINE 30 MG/ML IJ SOLN
30.0000 mg | Freq: Three times a day (TID) | INTRAMUSCULAR | Status: AC
Start: 2017-07-22 — End: 2017-07-24
  Administered 2017-07-22 – 2017-07-24 (×6): 30 mg via INTRAVENOUS
  Filled 2017-07-22 (×6): qty 1

## 2017-07-22 MED ORDER — ONDANSETRON 4 MG PO TBDP: 4.0000 mg | ORAL_TABLET | Freq: Four times a day (QID) | ORAL | Status: DC | PRN

## 2017-07-22 MED ORDER — ONDANSETRON HCL 4 MG/2ML IJ SOLN: 4.0000 mg | Freq: Four times a day (QID) | INTRAMUSCULAR | Status: DC | PRN

## 2017-07-22 MED ORDER — ONDANSETRON HCL 4 MG/2ML IJ SOLN
INTRAMUSCULAR | Status: DC | PRN
  Administered 2017-07-22: 4 mg via INTRAVENOUS

## 2017-07-22 MED ORDER — DIPHENHYDRAMINE HCL 12.5 MG/5ML PO ELIX: 12.5000 mg | ORAL_SOLUTION | Freq: Four times a day (QID) | ORAL | Status: DC | PRN

## 2017-07-22 MED ORDER — ZOLPIDEM TARTRATE 5 MG PO TABS
5.0000 mg | ORAL_TABLET | Freq: Every evening | ORAL | Status: DC | PRN
  Administered 2017-07-24: 5 mg via ORAL
  Filled 2017-07-22: qty 1

## 2017-07-22 MED ORDER — PANTOPRAZOLE SODIUM 40 MG PO TBEC
40.0000 mg | DELAYED_RELEASE_TABLET | Freq: Every day | ORAL | Status: DC
  Administered 2017-07-22 – 2017-07-24 (×3): 40 mg via ORAL
  Filled 2017-07-22 (×3): qty 1

## 2017-07-22 MED ORDER — SUCCINYLCHOLINE CHLORIDE 20 MG/ML IJ SOLN
INTRAMUSCULAR | Status: DC | PRN
  Administered 2017-07-22: 100 mg via INTRAVENOUS

## 2017-07-22 MED ORDER — KCL IN DEXTROSE-NACL 20-5-0.45 MEQ/L-%-% IV SOLN
INTRAVENOUS | Status: DC
  Administered 2017-07-22 – 2017-07-24 (×3): via INTRAVENOUS
  Filled 2017-07-22 (×3): qty 1000

## 2017-07-22 MED ORDER — ROCURONIUM BROMIDE 100 MG/10ML IV SOLN
INTRAVENOUS | Status: DC | PRN
  Administered 2017-07-22: 30 mg via INTRAVENOUS

## 2017-07-22 MED ORDER — FENTANYL CITRATE (PF) 250 MCG/5ML IJ SOLN
INTRAMUSCULAR | Status: DC | PRN
  Administered 2017-07-22: 50 ug via INTRAVENOUS
  Administered 2017-07-22: 100 ug via INTRAVENOUS

## 2017-07-22 MED ORDER — OXYCODONE HCL 5 MG PO TABS
5.0000 mg | ORAL_TABLET | ORAL | Status: DC | PRN
  Administered 2017-07-22: 5 mg via ORAL
  Administered 2017-07-23 – 2017-07-24 (×4): 10 mg via ORAL
  Filled 2017-07-22 (×4): qty 2

## 2017-07-22 MED ORDER — ATENOLOL 50 MG PO TABS
50.0000 mg | ORAL_TABLET | Freq: Every day | ORAL | Status: DC
  Administered 2017-07-22 – 2017-07-24 (×3): 50 mg via ORAL
  Filled 2017-07-22 (×3): qty 1

## 2017-07-22 MED ORDER — PROPOFOL 10 MG/ML IV BOLUS
INTRAVENOUS | Status: AC
  Filled 2017-07-22: qty 20

## 2017-07-22 SURGICAL SUPPLY — 38 items
BLADE SURG 15 STRL LF DISP TIS (BLADE) ×1 IMPLANT
BLADE SURG 15 STRL SS (BLADE) ×3
BRIEF STRETCH FOR OB PAD LRG (UNDERPADS AND DIAPERS) ×3 IMPLANT
CANISTER SUCT 3000ML PPV (MISCELLANEOUS) ×3 IMPLANT
COVER SURGICAL LIGHT HANDLE (MISCELLANEOUS) ×3 IMPLANT
DRAIN PENROSE 1/4X12 LTX STRL (WOUND CARE) ×2 IMPLANT
DRAPE LAPAROTOMY 100X72 PEDS (DRAPES) IMPLANT
DRAPE PROXIMA HALF (DRAPES) IMPLANT
DRSG PAD ABDOMINAL 8X10 ST (GAUZE/BANDAGES/DRESSINGS) ×3 IMPLANT
ELECT CAUTERY BLADE 6.4 (BLADE) ×3 IMPLANT
ELECT REM PT RETURN 9FT ADLT (ELECTROSURGICAL) ×3
ELECTRODE REM PT RTRN 9FT ADLT (ELECTROSURGICAL) ×1 IMPLANT
GAUZE IODOFORM PACK 1/2 7832 (GAUZE/BANDAGES/DRESSINGS) IMPLANT
GAUZE PACKING IODOFORM 1/4X15 (GAUZE/BANDAGES/DRESSINGS) ×2 IMPLANT
GAUZE SPONGE 4X4 12PLY STRL (GAUZE/BANDAGES/DRESSINGS) ×3 IMPLANT
GLOVE BIOGEL M STRL SZ7.5 (GLOVE) ×3 IMPLANT
GLOVE BIOGEL PI IND STRL 8 (GLOVE) ×2 IMPLANT
GLOVE BIOGEL PI INDICATOR 8 (GLOVE) ×4
GOWN STRL REUS W/ TWL LRG LVL3 (GOWN DISPOSABLE) ×1 IMPLANT
GOWN STRL REUS W/TWL 2XL LVL3 (GOWN DISPOSABLE) ×3 IMPLANT
GOWN STRL REUS W/TWL LRG LVL3 (GOWN DISPOSABLE) ×3
KIT BASIN OR (CUSTOM PROCEDURE TRAY) ×3 IMPLANT
KIT TURNOVER KIT B (KITS) ×3 IMPLANT
NS IRRIG 1000ML POUR BTL (IV SOLUTION) ×3 IMPLANT
PACK LITHOTOMY IV (CUSTOM PROCEDURE TRAY) IMPLANT
PACK SURGICAL SETUP 50X90 (CUSTOM PROCEDURE TRAY) IMPLANT
PAD ARMBOARD 7.5X6 YLW CONV (MISCELLANEOUS) ×3 IMPLANT
PENCIL BUTTON HOLSTER BLD 10FT (ELECTRODE) ×3 IMPLANT
SPONGE LAP 18X18 X RAY DECT (DISPOSABLE) ×3 IMPLANT
SUT ETHILON 3 0 FSL (SUTURE) ×2 IMPLANT
SWAB COLLECTION DEVICE MRSA (MISCELLANEOUS) IMPLANT
SWAB CULTURE ESWAB REG 1ML (MISCELLANEOUS) IMPLANT
TOWEL OR 17X24 6PK STRL BLUE (TOWEL DISPOSABLE) ×3 IMPLANT
TOWEL OR 17X26 10 PK STRL BLUE (TOWEL DISPOSABLE) ×3 IMPLANT
TUBE CONNECTING 12'X1/4 (SUCTIONS) ×1
TUBE CONNECTING 12X1/4 (SUCTIONS) ×2 IMPLANT
UNDERPAD 30X30 INCONTINENT (UNDERPADS AND DIAPERS) ×3 IMPLANT
YANKAUER SUCT BULB TIP NO VENT (SUCTIONS) ×3 IMPLANT

## 2017-07-22 NOTE — Anesthesia Postprocedure Evaluation (Signed)
Anesthesia Post Note  Patient: Corey Hale  Procedure(s) Performed: IRRIGATION, DEBRIDEMENT AND DRAINAGE PERIRECTAL ABSCESS (N/A Rectum) EXAM UNDER ANESTHESIA (N/A Rectum)     Patient location during evaluation: PACU Anesthesia Type: General Level of consciousness: awake and alert Pain management: pain level controlled Vital Signs Assessment: post-procedure vital signs reviewed and stable Respiratory status: spontaneous breathing, nonlabored ventilation, respiratory function stable and patient connected to nasal cannula oxygen Cardiovascular status: blood pressure returned to baseline and stable Postop Assessment: no apparent nausea or vomiting Anesthetic complications: no    Last Vitals:  Vitals:   07/22/17 1245 07/22/17 1300  BP: 95/67 102/66  Pulse: 87 84  Resp: 15 12  Temp:  36.6 C  SpO2: 92% 93%    Last Pain:  Vitals:   07/22/17 1300  TempSrc:   PainSc: Asleep                 Jemmie Rhinehart DAVID

## 2017-07-22 NOTE — Transfer of Care (Signed)
Immediate Anesthesia Transfer of Care Note  Patient: Corey Hale  Procedure(s) Performed: IRRIGATION, DEBRIDEMENT AND DRAINAGE PERIRECTAL ABSCESS (N/A Rectum) EXAM UNDER ANESTHESIA (N/A Rectum)  Patient Location: PACU  Anesthesia Type:General  Level of Consciousness: awake, alert  and oriented  Airway & Oxygen Therapy: Patient Spontanous Breathing and Patient connected to nasal cannula oxygen  Post-op Assessment: Report given to RN, Post -op Vital signs reviewed and stable and Patient moving all extremities X 4  Post vital signs: Reviewed and stable  Last Vitals:  Vitals Value Taken Time  BP 130/74 07/22/2017 11:41 AM  Temp 36.7 C 07/22/2017 11:40 AM  Pulse 91 07/22/2017 11:44 AM  Resp 18 07/22/2017 11:44 AM  SpO2 96 % 07/22/2017 11:44 AM  Vitals shown include unvalidated device data.  Last Pain:  Vitals:   07/22/17 0305  TempSrc: Oral  PainSc:          Complications: No apparent anesthesia complications

## 2017-07-22 NOTE — Interval H&P Note (Signed)
History and Physical Interval Note:  07/22/2017 8:12 AM  Corey Hale  has presented today for surgery, with the diagnosis of Perirectal Abscess  The various methods of treatment have been discussed with the patient and family. After consideration of risks, benefits and other options for treatment, the patient has consented to  Procedure(s): IRRIGATION AND DEBRIDEMENT PERIRECTAL ABSCESS (N/A) as a surgical intervention .  The patient's history has been reviewed, patient examined, no change in status, stable for surgery.  I have reviewed the patient's chart and labs.  Questions were answered to the patient's satisfaction.    Exam under anesthesia, Incision, drainage, possible debridement of perirectal abscess, possible seton placement  Given h/o of perirectal abscesses this is concerning for possible fistula. If fistula found, will place seton.   Risks of bleeding, infection, abscess, leak, injury to other organs, need for repair of tissues / organs, need for further treatment, heart attack, death, and blood clots other risks were discussed.  Benefits, alternatives were discussed. I noted a good likelihood this will help address the problem.  Questions answered.  The patient agrees to proceed.  [name]. [name], MD, FACS General, Bariatric, & Minimally Invasive Surgery [facility] [place]  [name]

## 2017-07-22 NOTE — Op Note (Signed)
07/21/2017 - 07/22/2017  11:32 AM  PATIENT:  Corey Hale  52 y.o. male  PRE-OPERATIVE DIAGNOSIS:  Large Left Perirectal Abscess  POST-OPERATIVE DIAGNOSIS:  same  PROCEDURE:  Procedure(s): IRRIGATION, DEBRIDEMENT AND DRAINAGE PERIRECTAL ABSCESS EXAM UNDER ANESTHESIA  SURGEON:  Surgeon(s): [name], MD  ASSISTANTS: none   ANESTHESIA:   general  DRAINS: Penrose drain in the thru the abscess cavity   LOCAL MEDICATIONS USED:  NONE  SPECIMEN:  Aspirate  DISPOSITION OF SPECIMEN:  micro  COUNTS:  YES  INDICATION FOR PROCEDURE: 52 year old gentleman who presented to the emergency room with 3 days of recurrent left medial buttock pain and swelling.  He had a prior history of incision and drainage for perirectal abscesses in the past.  His most recent one was last year in the emergency room.  A CT scan demonstrated a large perirectal abscess.  We discussed risk and benefits of the procedure.  Please see chart for additional details regarding that discussion  PROCEDURE: After obtaining consent the patient was taken to operating room 1 at [facility] placed upon the operating room table.  General endotracheal anesthesia was established.  Sequential compression devices were placed.  Patient was placed in lithotomy position with the appropriate padding.  His buttocks and perineum were prepped with Betadine.  A surgical timeout was performed.  He was on scheduled therapeutic antibiotics.  Visual inspection of his perirectal area with him being in the lithotomy position revealed a large amount of induration in the left perirectal area extending from the 3:00 to 6 o'clock position.  There is an area of fluctuance at around 5:00.  I aspirated the maximal area of induration was able to get out 15 cc of frank pus.  This was sent for analysis.  I then did a digital rectal exam.  There is no palpable masses.  Anoscopy was then performed which revealed mild grade 1 almost circumferential internal  hemorrhoids.  There is no  evidence of an abscess within the anorectal region.  Then using a 15 blade I elliptically excised an area of indurated skin and soft tissue in the left perirectal skin.  This was approximately 3 cm x 1/2 cm.  There was frank drainage of purulent material.  With the anoscope in the anus I then  Injected hydrogen peroxide with an Angiocath tip into the abscess cavity.  There is no evidence of hydroperoxide coming into the anorectal region.  I could not demonstrate a fistula.  The abscess cavity extended posteriorly and inferiorly toward the 6 o'clock position.  I made a separate stab incision in this location and also there was drainage of pus in this area.  Both the cavities connected underneath the skin.  A tonsil was used to bring out through the small stab incision.  A Penrose drain was then threaded through it.  The Penrose was secured to itself with 2 interrupted 2-0 nylon sutures.  The abscess cavity was irrigated.  Hemostasis was achieved with electrocautery.  Quarter inch endoform gauze was placed into the abscess cavity for packing.  Patient taken out of lithotomy and put back in supine position.  Fluffs and mesh underwear were applied.  Patient was extubated and taken to the recovery room in stable condition.  There were no immediate complications.    Tool used for debridement (curette, scapel, etc.) scalpel   Frequency of surgical debridement.   Initial   Measurement of total devitalized tissue (wound surface) before and after surgical debridement.   3  cm x 1/2 cm of skin and soft tissue   Area and depth of devitalized tissue removed from wound.  See above  Blood loss and description of tissue removed.  20 to 30 cc of blood loss: skin, soft tissue   Was there any viable tissue removed (measurements): No   PLAN OF CARE: Admit to inpatient   PATIENT DISPOSITION:  PACU - hemodynamically stable.   Delay start of Pharmacological VTE agent (>24hrs) due to surgical  blood loss or risk of bleeding:  no  [name]. [name], MD, FACS General, Bariatric, & Minimally Invasive Surgery [facility], [place]

## 2017-07-22 NOTE — Anesthesia Preprocedure Evaluation (Signed)
Anesthesia Evaluation  Patient identified by MRN, date of birth, ID band Patient awake    Reviewed: Allergy & Precautions, NPO status , Patient's Chart, lab work & pertinent test results  Airway Mallampati: I  TM Distance: >3 FB Neck ROM: Full    Dental   Pulmonary Current Smoker,    Pulmonary exam normal        Cardiovascular hypertension, Pt. on medications Normal cardiovascular exam     Neuro/Psych    GI/Hepatic   Endo/Other    Renal/GU      Musculoskeletal   Abdominal   Peds  Hematology   Anesthesia Other Findings   Reproductive/Obstetrics                             Anesthesia Physical Anesthesia Plan  ASA: II  Anesthesia Plan: General   Post-op Pain Management:    Induction: Intravenous  PONV Risk Score and Plan: 1 and Ondansetron and Treatment may vary due to age or medical condition  Airway Management Planned: LMA  Additional Equipment:   Intra-op Plan:   Post-operative Plan: Extubation in OR  Informed Consent: I have reviewed the patients History and Physical, chart, labs and discussed the procedure including the risks, benefits and alternatives for the proposed anesthesia with the patient or authorized representative who has indicated his/her understanding and acceptance.     Plan Discussed with: CRNA and Surgeon  Anesthesia Plan Comments:         Anesthesia Quick Evaluation

## 2017-07-22 NOTE — Anesthesia Procedure Notes (Signed)
Procedure Name: Intubation Date/Time: 07/22/2017 10:39 AM Performed by: Mariea Clonts, CRNA Pre-anesthesia Checklist: Patient identified, Emergency Drugs available, Suction available and Patient being monitored Patient Re-evaluated:Patient Re-evaluated prior to induction Oxygen Delivery Method: Circle System Utilized Preoxygenation: Pre-oxygenation with 100% oxygen Induction Type: IV induction Ventilation: Mask ventilation without difficulty Laryngoscope Size: Miller and 3 Grade View: Grade I Tube type: Oral Tube size: 7.5 mm Number of attempts: 1 Airway Equipment and Method: Stylet and Oral airway Placement Confirmation: ETT inserted through vocal cords under direct vision,  positive ETCO2 and breath sounds checked- equal and bilateral Tube secured with: Tape Dental Injury: Teeth and Oropharynx as per pre-operative assessment

## 2017-07-23 ENCOUNTER — Encounter (HOSPITAL_COMMUNITY): Payer: Self-pay | Admitting: General Surgery

## 2017-07-23 LAB — BASIC METABOLIC PANEL
ANION GAP: 10 (ref 5–15)
BUN: 17 mg/dL (ref 6–20)
CHLORIDE: 99 mmol/L — AB (ref 101–111)
CO2: 29 mmol/L (ref 22–32)
Calcium: 9 mg/dL (ref 8.9–10.3)
Creatinine, Ser: 0.85 mg/dL (ref 0.61–1.24)
GFR calc Af Amer: >60 mL/min (ref >60)
GFR calc non Af Amer: >60 mL/min (ref >60)
GLUCOSE: 140 mg/dL — AB (ref 65–99)
POTASSIUM: 4.1 mmol/L (ref 3.5–5.1)
Sodium: 138 mmol/L (ref 135–145)

## 2017-07-23 LAB — CBC
HEMATOCRIT: 38.2 % — AB (ref 39.0–52.0)
HEMOGLOBIN: 12.5 g/dL — AB (ref 13.0–17.0)
MCH: 28.8 pg (ref 26.0–34.0)
MCHC: 32.7 g/dL (ref 30.0–36.0)
MCV: 88 fL (ref 78.0–100.0)
Platelets: 278 10*3/uL (ref 150–400)
RBC: 4.34 MIL/uL (ref 4.22–5.81)
RDW: 13.5 % (ref 11.5–15.5)
WBC: 14.9 10*3/uL — AB (ref 4.0–10.5)

## 2017-07-23 MED ORDER — PRAVASTATIN SODIUM 10 MG PO TABS
10.0000 mg | ORAL_TABLET | Freq: Every day | ORAL | Status: DC
  Administered 2017-07-23: 10 mg via ORAL
  Filled 2017-07-23: qty 1

## 2017-07-23 NOTE — Discharge Instructions (Addendum)
Disposable Sitz Bath °A disposable sitz bath is a plastic basin that fits over the toilet. A bag is hung above the toilet, and the bag is connected to a tube that opens into the basin. The bag is filled with warm water that flows into the basin through the tube. A sitz bath can be used to help relieve symptoms, clean, and promote healing in the genital and anal areas, as well as in the lower abdomen and buttocks. °What are the risks? °Sitz baths are generally very safe. It is possible for the skin between the genitals and the anus (perineum) to become infected, but this is rare. You can avoid this by cleaning your sitz bath supplies thoroughly. °How to use a disposable sitz bath °1. Close the clamp on the tube. Make sure the clamp is closed tightly to prevent leakage. °2. Fill the sitz bath basin and the plastic bag with warm water. The water should be warm enough to be comfortable, but not hot. °3. Raise the toilet seat and place the filled basin on the toilet. Make sure the overflow opening is facing toward the back of the toilet. °? If you prefer, you may place the empty basin on the toilet first, and then use the plastic bag to fill the basin with warm water. °4. Hang the filled plastic bag overhead on a hook or towel rack close to the toilet. The bag should be higher than the toilet so that the water will flow down through the tube. °5. Attach the tube to the opening on the basin. Make sure that the tube is attached to the basin tightly to prevent leakage. °6. Sit on the basin and release the clamp. This will allow warm water to flow into the basin and flush the area around your genitals and anus. °7. Remain sitting on the basin for about 15-20 minutes, or as long as told by your health care provider. °8. Stand up and gently pat your skin dry. If directed, apply clean bandages (dressings) to the affected area as told by your health care provider. °9. Carefully remove the basin from the toilet seat and tip the  basin into the toilet to empty any remaining water. Empty any remaining water from the plastic bag into the toilet. Then, flush the toilet. °10. Wash the basin with warm water and soap. Let the basin air dry in the sink. You should also let the plastic bag and the tubing air dry. °11. Store the basin, tubing, and plastic bag in a clean, dry area. °12. Wash your hands with soap and water. If soap and water are not available, use hand sanitizer. °Contact a health care provider if: °· You have symptoms that get worse instead of better. °· You develop new skin irritation, redness, or swelling around your genitals or anus. °This information is not intended to replace advice given to you by your health care provider. Make sure you discuss any questions you have with your health care provider. °Document Released: 09/04/2011 Document Revised: 08/11/2015 Document Reviewed: 01/23/2015 °Elsevier Interactive Patient Education © 2018 [employer] ° °

## 2017-07-23 NOTE — Care Management Note (Signed)
Case Management Note  Patient Details  Name: Corey Hale MRN: [id number] Date of Birth: 1965/09/13  Subjective/Objective:                    Action/Plan:  Perirectal abscess S/p I&D and EUA 5/6 Dr. [name]   Expected Discharge Date:                  Expected Discharge Plan:  Home/Self Care  In-House Referral:  Financial Counselor  Discharge planning Services  [place] [facility], Medication Assistance  Post Acute Care Choice:    Choice offered to:     DME Arranged:  N/A DME Agency:  NA  HH Arranged:  NA HH Agency:  NA  Status of Service:  In process, will continue to follow  If discussed at Long Length of Stay Meetings, dates discussed:    Additional Comments:  Marilu Favre, RN 07/23/2017, 11:14 AM

## 2017-07-23 NOTE — Progress Notes (Signed)
[facility] Progress Note  1 Day Post-Op  Subjective: CC- perirectal abscess Overall doing well. Patient states that he is sore from surgery but pain well controlled. Did not sleep much last night.  Asking when he can go home. WBC slightly up today 14.9 from 13.4, afebrile.  Objective: Vital signs in last 24 hours: Temp:  [97.7 F (36.5 C)-98.5 F (36.9 C)] 98.3 F (36.8 C) (05/07 0643) Pulse Rate:  [66-93] 68 (05/07 0643) Resp:  [11-20] 20 (05/06 1500) BP: (95-130)/(60-79) 96/60 (05/07 0643) SpO2:  [92 %-95 %] 93 % (05/07 0643)    Intake/Output from previous day: 05/06 0701 - 05/07 0700 In: 3035 [P.O.:840; I.V.:2195] Out: 10 [Blood:10] Intake/Output this shift: No intake/output data recorded.  PE: Gen:  Alert, NAD, pleasant HEENT: EOM's intact, pupils equal and round Pulm: effort normal Abd: Soft, NT/ND GU: perirectal abscess s/p I&D with penrose drain in place, trace purulent drainage, no fluctuance or erythema noted >> packing removed and repacked loosely with 2x2 dampened with sterile water  Lab Results:  Recent Labs    07/21/17 1620 07/23/17 0429  WBC 13.4* 14.9*  HGB 14.5 12.5*  HCT 43.1 38.2*  PLT 235 278   BMET Recent Labs    07/21/17 1620 07/23/17 0429  NA 139 138  K 3.0* 4.1  CL 98* 99*  CO2 31 29  GLUCOSE 85 140*  BUN 10 17  CREATININE 0.78 0.85  CALCIUM 9.3 9.0   PT/INR No results for input(s): LABPROT, INR in the last 72 hours. CMP     Component Value Date/Time   NA 138 07/23/2017 0429   NA 137 06/27/2016 1142   K 4.1 07/23/2017 0429   CL 99 (L) 07/23/2017 0429   CO2 29 07/23/2017 0429   GLUCOSE 140 (H) 07/23/2017 0429   BUN 17 07/23/2017 0429   BUN 11 06/27/2016 1142   CREATININE 0.85 07/23/2017 0429   CREATININE 0.89 02/23/2014 0935   CALCIUM 9.0 07/23/2017 0429   PROT 7.8 06/27/2016 1142   ALBUMIN 4.7 06/27/2016 1142   AST 32 06/27/2016 1142   ALT 58 (H) 06/27/2016 1142   ALKPHOS 72 06/27/2016 1142   BILITOT 0.4 06/27/2016 1142   GFRNONAA >60 07/23/2017 0429   GFRNONAA >89 02/23/2014 0935   GFRAA >60 07/23/2017 0429   GFRAA >89 02/23/2014 0935   Lipase  No results found for: LIPASE     Studies/Results: Ct Pelvis W Contrast  Result Date: 07/21/2017 CLINICAL DATA:  Left buttock boil with pain. EXAM: CT PELVIS WITH CONTRAST TECHNIQUE: Multidetector CT imaging of the pelvis was performed using the standard protocol following the bolus administration of intravenous contrast. CONTRAST:  111mL OMNIPAQUE IOHEXOL 300 MG/ML  SOLN COMPARISON:  None. FINDINGS: Urinary Tract:  Negative. Bowel: Visualized portions of the small bowel, appendix and colon are unremarkable. Vascular/Lymphatic: Atherosclerotic calcification. No pathologically enlarged lymph nodes. Reproductive:  Prostate is visualized. Other: An irregular low-density fluid collection is seen deep to the left gluteal fold, measuring approximately 3.5 x 3.9 x 6.1 cm. There is a tract to the skin surface. Surrounding inflammatory haziness and stranding. Musculoskeletal: Well-circumscribed sclerotic lesion with lucent center in the right iliac wing is nonspecific but has a benign imaging appearance. IMPRESSION: Fluid collection deep to the left gluteal fold with extensive surrounding inflammatory changes and is worrisome for an abscess. Associated tract to the skin surface. Electronically Signed   By: Lorin Picket M.D.   On: 07/21/2017 18:29    Anti-infectives: Anti-infectives (From admission, onward)  Start     Dose/Rate Route Frequency Ordered Stop   07/22/17 2200  piperacillin-tazobactam (ZOSYN) IVPB 3.375 g     3.375 g 12.5 mL/hr over 240 Minutes Intravenous Every 8 hours 07/22/17 2156     07/22/17 0600  cefTRIAXone (ROCEPHIN) 2 g in sodium chloride 0.9 % 100 mL IVPB     2 g 200 mL/hr over 30 Minutes Intravenous On call to O.R. 07/21/17 2129 07/22/17 1042   07/22/17 0600  metroNIDAZOLE (FLAGYL) IVPB 500 mg     500 mg 100 mL/hr over  60 Minutes Intravenous On call to O.R. 07/21/17 2129 07/22/17 1051   07/21/17 2000  piperacillin-tazobactam (ZOSYN) IVPB 3.375 g  Status:  Discontinued     3.375 g 12.5 mL/hr over 240 Minutes Intravenous Every 8 hours 07/21/17 1854 07/22/17 1439       Assessment/Plan HTN - hold home meds due to hypotension, RN to recheck BP HLD - pravastatin  Perirectal abscess S/p I&D and EUA 5/6 Dr. [name] - POD 1 - culture pending  ID - zosyn 5/6>> FEN - regular diet, colace VTE - SCDs, lovenox Foley - none Follow up - Dr. [name]  Plan - First dressing change performed. Leave penrose drain in place. Remove packing to allow patient to shower then repack loosely. Continue IV abx and follow culture. Labs in AM.   LOS: 2 days    Wellington Hampshire , [facility] 07/23/2017, 8:57 AM Pager: [phone] Consults: [phone] Mon-Fri 7:00 am-4:30 pm Sat-Sun 7:00 am-11:30 am

## 2017-07-23 NOTE — Progress Notes (Signed)
Patient refused Lovenox.  Nurse given report that patient refused to wear SCD.  Educated nurse on both being ordered to prevent blood clots.  Encouraged patient to ambulate as much as possible if not going to accept meds ordered for prevention.  Will continue to monitor.

## 2017-07-24 LAB — CBC
HCT: 34.3 % — ABNORMAL LOW (ref 39.0–52.0)
HEMOGLOBIN: 11.1 g/dL — AB (ref 13.0–17.0)
MCH: 28.3 pg (ref 26.0–34.0)
MCHC: 32.4 g/dL (ref 30.0–36.0)
MCV: 87.5 fL (ref 78.0–100.0)
Platelets: 237 10*3/uL (ref 150–400)
RBC: 3.92 MIL/uL — AB (ref 4.22–5.81)
RDW: 13.6 % (ref 11.5–15.5)
WBC: 12 10*3/uL — ABNORMAL HIGH (ref 4.0–10.5)

## 2017-07-24 LAB — BASIC METABOLIC PANEL
Anion gap: 8 (ref 5–15)
BUN: 13 mg/dL (ref 6–20)
CHLORIDE: 103 mmol/L (ref 101–111)
CO2: 28 mmol/L (ref 22–32)
Calcium: 8.5 mg/dL — ABNORMAL LOW (ref 8.9–10.3)
Creatinine, Ser: 0.87 mg/dL (ref 0.61–1.24)
GFR calc Af Amer: >60 mL/min (ref >60)
GFR calc non Af Amer: >60 mL/min (ref >60)
GLUCOSE: 104 mg/dL — AB (ref 65–99)
POTASSIUM: 3.3 mmol/L — AB (ref 3.5–5.1)
SODIUM: 139 mmol/L (ref 135–145)

## 2017-07-24 MED ORDER — AMOXICILLIN-POT CLAVULANATE 875-125 MG PO TABS: 1.0000 | ORAL_TABLET | Freq: Two times a day (BID) | ORAL | 0 refills | Status: AC

## 2017-07-24 MED ORDER — DOCUSATE SODIUM 100 MG PO CAPS: 100.0000 mg | ORAL_CAPSULE | Freq: Two times a day (BID) | ORAL | 0 refills | Status: DC

## 2017-07-24 MED FILL — AMOX-CLAV 875-125 MG TABLET: 875-125 | 7 days supply | Qty: 14 | Fill #0

## 2017-07-24 NOTE — Discharge Summary (Signed)
[facility] Discharge Summary   Patient ID: Corey Hale MRN: [id number] DOB/AGE: 11-28-1965 53 y.o.  Admit date: 07/21/2017 Discharge date: 07/24/2017  Admitting Diagnosis: Perirectal abscess  Discharge Diagnosis Patient Active Problem List   Diagnosis Date Noted  . Hyperlipidemia 07/21/2017  . History of abscess of skin and subcutaneous tissue 07/21/2017  . 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 Herpes exposure 03/28/2016  . Chronic back pain 05/30/2015  . Mid back pain, chronic 05/26/2015  . Chronic midline low back pain without sciatica 05/26/2015  . Midline thoracic back pain 03/07/2015  . Back pain at L4-L5 level 07/06/2014  . History of vertebral fracture 05/20/2014  . Hypertension 02/23/2014  . 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 Hearing loss 09/07/2013  . Dental caries 06/10/2012  . Erectile dysfunction 06/10/2012  . Tobacco abuse 06/10/2012  . Dyslipidemia 06/10/2012  . Personal history of goiter 06/10/2012  . Genital herpes 03/05/2012  . Thoracic back pain 07/21/2009  . THYROID NODULE, HX OF 07/21/2009    Consultants None  Imaging: No results found.  Procedures Dr. [name] (07/22/17) - IRRIGATION, DEBRIDEMENT AND DRAINAGE PERIRECTAL ABSCESS EXAM UNDER ANESTHESIA   Hospital Course:  Corey Hale is a 52yo male who presented to [facility] 5/5 with 3 days of perirectal pain and swelling. Patient has a h/o perirectal abscesses, most recently last year requiring I&D in the ED.  Workup showed recurrent perirectal abscess.  Patient was admitted and underwent procedure listed above.  Tolerated procedure well and was transferred to the floor.  He was kept on IV zosyn during his admission. On POD2, the patient was voiding well, tolerating diet, ambulating well, pain well controlled, vital signs stable and felt stable for discharge home.  He will go home with 7 days of augmentin and perform sitz baths. Intraoperative cultures still pending at time of discharge.  Patient will follow up as below and knows to call with questions or concerns.    I have personally reviewed the patients medication history on the [place] controlled substance database.    Physical Exam: Gen:  Alert, NAD, pleasant HEENT: EOM's intact, pupils equal and round Pulm: effort normal Abd: Soft, NT/ND GU: perirectal abscess s/p I&D with penrose drain in place, trace purulent drainage, no fluctuance or erythema noted  Allergies as of 07/24/2017   No Known Allergies     Medication List    STOP taking these medications   carbamide peroxide 6.5 % OTIC solution Commonly known as:  DEBROX   doxycycline 100 MG capsule Commonly known as:  VIBRAMYCIN   hydrocortisone cream 1 %   triamcinolone cream 0.1 % Commonly known as:  KENALOG     TAKE these medications   amoxicillin-clavulanate 875-125 MG tablet Commonly known as:  AUGMENTIN Take 1 tablet by mouth 2 (two) times daily for 7 days.   aspirin 81 MG tablet Take 1 tablet (81 mg total) by mouth daily.   atenolol-chlorthalidone 50-25 MG tablet Commonly known as:  TENORETIC Take 1 tablet by mouth daily.   docusate sodium 100 MG capsule Commonly known as:  COLACE Take 1 capsule (100 mg total) by mouth 2 (two) times daily.   ibuprofen 800 MG tablet Commonly known as:  ADVIL,MOTRIN Take 1 tablet (800 mg total) by mouth every 8 (eight) hours as needed.   Lamisil AT Powd Apply to affected area (left Foot) twice a day. What changed:    how much to take  how to take this  when to take this  reasons  to take this  additional instructions   oxyCODONE 15 MG immediate release tablet Commonly known as:  ROXICODONE Take 15 mg by mouth as needed.   pravastatin 20 MG tablet Commonly known as:  PRAVACHOL Take 0.5 tablets (10 mg total) by mouth daily.   sildenafil 100 MG tablet Commonly known as:  VIAGRA Take 0.5-1 tablets (50-100 mg total) by mouth daily as needed for erectile dysfunction.   valACYclovir 1000 MG  tablet Commonly known as:  VALTREX TAKE 1 TABLET BY MOUTH DAILY        Follow-up Information    [name], MD. Call.   Specialty:  General Surgery Why:  We are working on your appointment, please call to confirm. Please arrive 30 minutes prior to your appointment to check in and fill out paperwork. Bring photo ID and insurance information. Contact information: [place] [address] [phone]           Signed: Wellington Hampshire, [facility] 07/24/2017, 8:51 AM Pager: [phone] Consults: [phone] Mon-Fri 7:00 am-4:30 pm Sat-Sun 7:00 am-11:30 am

## 2017-07-24 NOTE — Progress Notes (Signed)
Pt discharged home in stable condition after going over discharge teaching with no concerns voiced. AVS given before leaving unit 

## 2017-07-24 NOTE — Care Management Note (Signed)
Case Management Note  Patient Details  Name: Corey Hale MRN: [id number] Date of Birth: 20-May-1965  Subjective/Objective:                    Action/Plan: Patient active with [facility] and can have his prescription sent there. [facility] PA aware.  Expected Discharge Date:  07/24/17               Expected Discharge Plan:  Home/Self Care  In-House Referral:  Financial Counselor  Discharge planning Services     Post Acute Care Choice:  NA Choice offered to:  Patient  DME Arranged:  N/A DME Agency:  NA  HH Arranged:  NA HH Agency:  NA  Status of Service:  Completed, signed off  If discussed at [place] of Stay Meetings, dates discussed:    Additional Comments:  Marilu Favre, RN 07/24/2017, 9:52 AM

## 2017-07-24 NOTE — Progress Notes (Signed)
Pharmacy Antibiotic Note  Corey Hale is a 52 y.o. male admitted on 07/21/2017 with perirectal abscess, s/p I&D on 07/22/17.   Pharmacy has been consulted for zosyn dosing.   Renal function stable.  Afebrile and WBC improving.   Plan: Zosyn EID 3.375gm IV Q8H Monitor renal fxn, micro data F/U KCL supplementation     Temp (24hrs), Avg:97.8 F (36.6 C), Min:97.7 F (36.5 C), Max:98.1 F (36.7 C)  Recent Labs  Lab 07/21/17 1620 07/23/17 0429 07/24/17 0434  WBC 13.4* 14.9* 12.0*  CREATININE 0.78 0.85 0.87    Estimated Creatinine Clearance: 113.5 mL/min (by C-G formula based on SCr of 0.87 mg/dL).    No Known Allergies   Zosyn 5/5 >>  5/5 HIV - negative 5/6 abscess (deep) - GPC, GNR, GPR on Gram stain (reincubated)   Corey Hale D. Mina Marble, PharmD, BCPS, BCCCP Pager:  [phone] 07/24/2017, 12:12 PM

## 2017-07-27 LAB — AEROBIC/ANAEROBIC CULTURE W GRAM STAIN (SURGICAL/DEEP WOUND)

## 2017-07-27 LAB — AEROBIC/ANAEROBIC CULTURE (SURGICAL/DEEP WOUND)

## 2017-07-30 ENCOUNTER — Encounter

## 2017-07-30 ENCOUNTER — Ambulatory Visit: Payer: Medicaid Other | Admitting: Family Medicine

## 2017-08-19 MED FILL — PRAVASTATIN SODIUM 20 MG TA: 20 | 30 days supply | Qty: 15 | Fill #4

## 2017-08-19 MED FILL — ATENOLOL/CHLORTHAL 50/25: 50-25 | 30 days supply | Qty: 30 | Fill #4

## 2017-09-01 ENCOUNTER — Encounter (HOSPITAL_BASED_OUTPATIENT_CLINIC_OR_DEPARTMENT_OTHER): Payer: Self-pay | Admitting: Emergency Medicine

## 2017-09-01 ENCOUNTER — Emergency Department (HOSPITAL_BASED_OUTPATIENT_CLINIC_OR_DEPARTMENT_OTHER)
Admission: EM | Admit: 2017-09-01 | Discharge: 2017-09-01 | Disposition: A | Discharge status: Home / Self Care | Payer: Medicaid Other | Attending: Emergency Medicine | Admitting: Emergency Medicine

## 2017-09-01 ENCOUNTER — Other Ambulatory Visit: Payer: Self-pay

## 2017-09-01 DIAGNOSIS — L02219 Cutaneous abscess of trunk, unspecified: Secondary | ICD-10-CM

## 2017-09-01 DIAGNOSIS — L03319 Cellulitis of trunk, unspecified: Secondary | ICD-10-CM | POA: Insufficient documentation

## 2017-09-01 DIAGNOSIS — F17210 Nicotine dependence, cigarettes, uncomplicated: Secondary | ICD-10-CM | POA: Insufficient documentation

## 2017-09-01 DIAGNOSIS — I10 Essential (primary) hypertension: Secondary | ICD-10-CM | POA: Insufficient documentation

## 2017-09-01 DIAGNOSIS — Z79899 Other long term (current) drug therapy: Secondary | ICD-10-CM | POA: Insufficient documentation

## 2017-09-01 DIAGNOSIS — Z7982 Long term (current) use of aspirin: Secondary | ICD-10-CM | POA: Insufficient documentation

## 2017-09-01 MED ORDER — LIDOCAINE-EPINEPHRINE (PF) 2 %-1:200000 IJ SOLN
INTRAMUSCULAR | Status: AC
  Administered 2017-09-01: 20 mL via INTRADERMAL
  Filled 2017-09-01: qty 10

## 2017-09-01 MED ORDER — OXYCODONE-ACETAMINOPHEN 5-325 MG PO TABS
1.0000 | ORAL_TABLET | Freq: Once | ORAL | Status: AC
  Administered 2017-09-01: 1 via ORAL
  Filled 2017-09-01: qty 1

## 2017-09-01 MED ORDER — LIDOCAINE-EPINEPHRINE (PF) 2 %-1:200000 IJ SOLN
20.0000 mL | Freq: Once | INTRAMUSCULAR | Status: AC
  Administered 2017-09-01: 20 mL via INTRADERMAL
  Filled 2017-09-01: qty 20

## 2017-09-01 MED ORDER — SULFAMETHOXAZOLE-TRIMETHOPRIM 800-160 MG PO TABS: 1.0000 | ORAL_TABLET | Freq: Two times a day (BID) | ORAL | 0 refills | Status: AC

## 2017-09-01 NOTE — Discharge Instructions (Addendum)
Soak with warm soapy water daily starting tomorrow. The incision will continue to drain for a few days.   Take antibiotic twice a day for a week.   Return to the ER for any new or concerning symptoms like worsening redness, swelling or fever.

## 2017-09-01 NOTE — ED Provider Notes (Signed)
[facility] EMERGENCY DEPARTMENT Provider Note   CSN: [id number] Arrival date & time: 09/01/17  1453     History   Chief Complaint Chief Complaint  Patient presents with  . Abscess    HPI Corey Hale is a 52 y.o. male.  HPI   Patient is a 52yo male with a history of hypertension, hyperlipidemia and perirectal abscess (I&D under general anesthesia 07/22/2017) who presents to the emergency department for evaluation of right sided flank swelling and erythema.  Patient states that the area has been gradually worsening over the past week.  He has overlying sharp pain which is worsened when he lays on the right side or with palpation.  He tried to get the area to drain on its own by soaking in a warm bath, but this did not improve his symptoms.  He denies fevers, chills, rash or erythema elsewhere, abdominal pain, nausea/vomiting.  States that his perirectal abscess has completely resolved and he has no rectal pain or painful bowel movements.  Denies IV drug use.  Past Medical History:  Diagnosis Date  . Chronic back pain   . Hypercholesteremia   . Hypertension   . Thyroid disease     Patient Active Problem List   Diagnosis Date Noted  . Hyperlipidemia 07/21/2017  . History of abscess of skin and subcutaneous tissue 07/21/2017  . 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 Herpes exposure 03/28/2016  . Chronic back pain 05/30/2015  . Mid back pain, chronic 05/26/2015  . Chronic midline low back pain without sciatica 05/26/2015  . Midline thoracic back pain 03/07/2015  . Back pain at L4-L5 level 07/06/2014  . History of vertebral fracture 05/20/2014  . Hypertension 02/23/2014  . 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 Hearing loss 09/07/2013  . Dental caries 06/10/2012  . Erectile dysfunction 06/10/2012  . Tobacco abuse 06/10/2012  . Dyslipidemia 06/10/2012  . Personal history of goiter 06/10/2012  . Genital herpes 03/05/2012  . Thoracic back pain 07/21/2009  . THYROID  NODULE, HX OF 07/21/2009    Past Surgical History:  Procedure Laterality Date  . ARM HARDWARE REMOVAL Right   . INCISION AND DRAINAGE PERIRECTAL ABSCESS N/A 07/22/2017   Procedure: IRRIGATION, DEBRIDEMENT AND DRAINAGE PERIRECTAL ABSCESS;  Surgeon: [name], MD;  Location: [facility];  Service: General;  Laterality: N/A;  . NECK SURGERY    . PAROTIDECTOMY     Dr Redmond Baseman  . THYROIDECTOMY, PARTIAL  2008   Dr Marlou Starks        Home Medications    Prior to Admission medications   Medication Sig Start Date End Date Taking? Authorizing Provider  aspirin 81 MG tablet Take 1 tablet (81 mg total) by mouth daily. 03/07/15   Tresa Garter, MD  atenolol-chlorthalidone (TENORETIC) 50-25 MG tablet Take 1 tablet by mouth daily. 04/10/17   Tresa Garter, MD  docusate sodium (COLACE) 100 MG capsule Take 1 capsule (100 mg total) by mouth 2 (two) times daily. 07/24/17   Meuth, Brooke A, PA-C  ibuprofen (ADVIL,MOTRIN) 800 MG tablet Take 1 tablet (800 mg total) by mouth every 8 (eight) hours as needed. 07/13/17   Long, Wonda Olds, MD  oxyCODONE (ROXICODONE) 15 MG immediate release tablet Take 15 mg by mouth as needed. 06/26/17   [provider]  pravastatin (PRAVACHOL) 20 MG tablet Take 0.5 tablets (10 mg total) by mouth daily. 04/10/17   Tresa Garter, MD  sildenafil (VIAGRA) 100 MG tablet Take 0.5-1 tablets (50-100 mg total) by  mouth daily as needed for erectile dysfunction. Patient not taking: Reported on 07/21/2017 04/10/17   Tresa Garter, MD  Talc-Starch (LAMISIL AT) POWD Apply to affected area (left Foot) twice a day. Patient taking differently: Apply 1 application topically as needed. Apply to affected area (left Foot) twice a day. 05/15/17   Tresa Garter, MD  valACYclovir (VALTREX) 1000 MG tablet TAKE 1 TABLET BY MOUTH DAILY 05/09/17   Tresa Garter, MD    Family History Family History  Problem Relation Age of Onset  . Stroke Mother   . Stroke Father      Social History Social History   Tobacco Use  . Smoking status: Current Every Day Smoker    Packs/day: 0.50    Types: Cigarettes    Last attempt to quit: 03/23/2015    Years since quitting: 2.4  . Smokeless tobacco: Never Used  Substance Use Topics  . Alcohol use: No    Alcohol/week: 0.0 oz    Frequency: Never  . Drug use: No     Allergies   Patient has no known allergies.   Review of Systems Review of Systems  Constitutional: Negative for chills and fever.  Respiratory: Negative for shortness of breath.   Cardiovascular: Negative for chest pain.  Gastrointestinal: Negative for abdominal pain, nausea and vomiting.  Genitourinary: Negative for difficulty urinating and dysuria.  Musculoskeletal: Negative for gait problem.  Skin: Positive for color change (erythema right flank).  Neurological: Negative for headaches.  Psychiatric/Behavioral: Negative for agitation.  All other systems reviewed and are negative.    Physical Exam Updated Vital Signs BP (!) 117/92 (BP Location: Left Arm)   Pulse 71   Temp 98.5 F (36.9 C) (Oral)   Resp 18   Ht 6\' 1"  (1.854 m)   Wt 93.4 kg (206 lb)   SpO2 100%   BMI 27.18 kg/m   Physical Exam  Constitutional: He appears well-developed and well-nourished. No distress.  Sitting at bedside in no apparent distress, non-toxic appearing.   HENT:  Head: Normocephalic and atraumatic.  Mouth/Throat: Oropharynx is clear and moist. No oropharyngeal exudate.  Eyes: Pupils are equal, round, and reactive to light. Conjunctivae are normal. Right eye exhibits no discharge. Left eye exhibits no discharge.  Neck: Normal range of motion. Neck supple.  Cardiovascular: Normal rate, regular rhythm and intact distal pulses.  Pulmonary/Chest: Effort normal and breath sounds normal. No stridor. No respiratory distress. He has no wheezes. He has no rales.  Abdominal: Soft. There is no tenderness.  Musculoskeletal:  Right flank with 5cm x 3cm area of  erythema, inuration and warmth. There is a central area of fluctuance which is tender to the touch. No active drainage.   Neurological: He is alert. Coordination normal.  Skin: He is not diaphoretic.  Psychiatric: He has a normal mood and affect. His behavior is normal.  Nursing note and vitals reviewed.      ED Treatments / Results  Labs (all labs ordered are listed, but only abnormal results are displayed) Labs Reviewed - No data to display  EKG None  Radiology No results found.  Procedures .Marland KitchenIncision and Drainage Date/Time: 09/01/2017 4:25 PM Performed by: Glyn Ade, PA-C Authorized by: Glyn Ade, PA-C   Consent:    Consent obtained:  Verbal   Consent given by:  Patient   Risks discussed:  Bleeding, incomplete drainage, infection and pain   Alternatives discussed:  No treatment and delayed treatment Location:    Type:  Abscess  Size:  3cm   Location:  Trunk   Trunk location: right flank. Pre-procedure details:    Skin preparation:  Betadine Anesthesia (see MAR for exact dosages):    Anesthesia method:  Local infiltration   Local anesthetic:  Lidocaine 2% WITH epi Procedure type:    Complexity:  Complex Procedure details:    Incision types:  Single straight   Scalpel blade:  11   Wound management:  Probed and deloculated and irrigated with saline   Drainage:  Purulent   Drainage amount:  Moderate   Wound treatment:  Wound left open   Packing materials:  None Post-procedure details:    Patient tolerance of procedure:  Tolerated well, no immediate complications   (including critical care time)  EMERGENCY DEPARTMENT US SOFT TISSUE INTERPRETATION "Study: Limited Soft Tissue Ultrasound"  INDICATIONS: Pain and Soft tissue infection Multiple views of the body part were obtained in real-time with a multi-frequency linear probe  PERFORMED BY: Myself IMAGES ARCHIVED?: Yes SIDE:Right  BODY PART:right flank INTERPRETATION:  Abcess present and  Cellulitis present  Medications Ordered in ED Medications  oxyCODONE-acetaminophen (PERCOCET/ROXICET) 5-325 MG per tablet 1 tablet (has no administration in time range)  lidocaine-EPINEPHrine (XYLOCAINE W/EPI) 2 %-1:200000 (PF) injection 20 mL (20 mLs Intradermal Given 09/01/17 1600)     Initial Impression / Assessment and Plan / ED Course  I have reviewed the triage vital signs and the nursing notes.  Pertinent labs & imaging results that were available during my care of the patient were reviewed by me and considered in my medical decision making (see chart for details).     Patient with skin abscess amenable to incision and drainage.  Abscess was not large enough to warrant packing or drain, wound recheck in 2 days. Encouraged home warm soaks and flushing. Moderate signs of cellulitis is surrounding skin, will d/c with bactrim.   Final Clinical Impressions(s) / ED Diagnoses   Final diagnoses:  Cellulitis and abscess of trunk    ED Discharge Orders        Ordered    sulfamethoxazole-trimethoprim (BACTRIM DS,SEPTRA DS) 800-160 MG tablet  2 times daily     09/01/17 1628       Glyn Ade, PA-C 09/01/17 1631    Sherwood Gambler, MD 09/02/17 0004

## 2017-09-01 NOTE — ED Triage Notes (Signed)
Patient states that he has had pain to his right side x 5 -6 days - patient has a noted abscess and cellulitis to his right side

## 2017-09-02 MED FILL — SULFAMETHOXAZOLE-TMP DS TAB: 800-160 | 7 days supply | Qty: 14 | Fill #0

## 2017-09-16 MED FILL — valACYclovir HCL 1 GM TABS: 1 | 30 days supply | Qty: 30 | Fill #2

## 2017-09-16 MED FILL — ATENOLOL/CHLORTHAL 50/25: 50-25 | 30 days supply | Qty: 30 | Fill #5

## 2017-09-16 MED FILL — !VIAGRA 100MG TABLET: 100 | 30 days supply | Qty: 10 | Fill #2

## 2017-09-16 MED FILL — PRAVASTATIN SODIUM 20 MG TA: 20 | 30 days supply | Qty: 15 | Fill #5

## 2017-09-25 ENCOUNTER — Ambulatory Visit: Payer: Medicaid Other | Admitting: Nurse Practitioner

## 2017-10-04 MED FILL — AMOX-CLAV 875-125 MG TABLET: 875-125 | 7 days supply | Qty: 14 | Fill #0

## 2017-10-10 MED FILL — AMOX-CLAV 875-125 MG TABLET: 875-125 | 7 days supply | Qty: 14 | Fill #0

## 2017-10-10 NOTE — Telephone Encounter (Signed)
closing due to length of time and patient dismissed from clinic

## 2017-10-14 MED FILL — valACYclovir HCL 1 GM TABS: 1 | 30 days supply | Qty: 30 | Fill #3

## 2017-10-14 MED FILL — ATENOLOL/CHLORTHAL 50/25: 50-25 | 30 days supply | Qty: 30 | Fill #6

## 2017-10-15 ENCOUNTER — Other Ambulatory Visit: Payer: Self-pay | Admitting: Internal Medicine

## 2017-10-15 DIAGNOSIS — E785 Hyperlipidemia, unspecified: Secondary | ICD-10-CM

## 2017-10-15 MED FILL — PRAVASTATIN SODIUM 20 MG TA: 20 | 30 days supply | Qty: 15 | Fill #0

## 2017-10-21 ENCOUNTER — Ambulatory Visit: Payer: Self-pay | Admitting: General Surgery

## 2017-10-21 NOTE — H&P (Signed)
History of Present Illness [name] MD; 10/21/2776 4:34 PM) The patient is a 52 year old male who presents with anal pain. PMHx of left perirectal abscess drained in the OR on Jul 22, 2017 by Dr. [name], who presented to urgent office with c/o anal pain x 5 days proximally 2 weeks ago. He states that it continued to drain for several weeks and then stopped. He has had perirectal abscesses in 2014 and 2018 in addition to the one in May 2019. He is otherwise healthy and denies hx of IBD and DM. Non-smoker. Not on blood thinners. He denies any chronic drainage in between abscesses. Upon review of the operative note by Dr. [name] it appears he had a large left lateral perianal cavity. Hydrogen peroxide did not demonstrate a fistulous connection at that time. A Penrose drain was placed in the anterior superior portions of the cavity and left in place for approximately 2 weeks while the area healed and drained.   Problem List/Past Medical [name], MD; 04/23/2351 4:27 PM) Georgetta Haber CHECK (Z09) ABSCESS, PERIRECTAL (K61.1)  Diagnostic Studies History [name], MD; 08/17/4429 4:27 PM) Colonoscopy never  Allergies Mammie Lorenzo, LPN; 07/20/84 7:61 PM) No Known Drug Allergies [08/08/2017]:  Medication History Mammie Lorenzo, LPN; 11/22/930 6:71 PM) OxyCODONE HCl (15MG  Tablet, Oral) Active. ValACYclovir HCl (1GM Tablet, Oral) Active. Atenolol-Chlorthalidone (50-25MG  Tablet, Oral) Active. Ibuprofen (800MG  Tablet, Oral) Active. Pravastatin Sodium (20MG  Tablet, Oral) Active. Medications Reconciled  Family History [name], MD; 04/23/5807 4:27 PM) Diabetes Mellitus Father. Hypertension Father.  Other Problems [name], MD; 11/24/3380 4:27 PM) Back Pain High blood pressure Hypercholesterolemia     Review of Systems [name] MD; 5/0/5397 4:31 PM) General Not Present- Appetite Loss, Chills, Fatigue, Fever, Night Sweats, Weight Gain and Weight Loss. Skin  Not Present- Change in Wart/Mole, Dryness, Hives, Jaundice, New Lesions, Non-Healing Wounds, Rash and Ulcer. HEENT Not Present- Earache, Hearing Loss, Hoarseness, Nose Bleed, Oral Ulcers, Ringing in the Ears, Seasonal Allergies, Sinus Pain, Sore Throat, Visual Disturbances, Wears glasses/contact lenses and Yellow Eyes. Respiratory Not Present- Bloody sputum, Chronic Cough, Difficulty Breathing, Snoring and Wheezing. Breast Not Present- Breast Mass, Breast Pain, Nipple Discharge and Skin Changes. Cardiovascular Not Present- Chest Pain, Difficulty Breathing Lying Down, Leg Cramps, Palpitations, Rapid Heart Rate, Shortness of Breath and Swelling of Extremities. Gastrointestinal Not Present- Abdominal Pain, Bloating, Bloody Stool, Change in Bowel Habits, Chronic diarrhea, Constipation, Difficulty Swallowing, Excessive gas, Gets full quickly at meals, Hemorrhoids, Indigestion, Nausea, Rectal Pain and Vomiting. Male Genitourinary Not Present- Blood in Urine, Change in Urinary Stream, Frequency, Impotence, Nocturia, Painful Urination, Urgency and Urine Leakage. Musculoskeletal Not Present- Back Pain, Joint Pain, Joint Stiffness, Muscle Pain, Muscle Weakness and Swelling of Extremities. Neurological Not Present- Decreased Memory, Fainting, Headaches, Numbness, Seizures, Tingling, Tremor, Trouble walking and Weakness. Psychiatric Not Present- Anxiety, Bipolar, Change in Sleep Pattern, Depression, Fearful and Frequent crying.  Vitals Claiborne Billings Dockery LPN; 08/23/3417 3:79 PM) 10/21/2017 4:18 PM Weight: 196.2 lb Height: 73in Body Surface Area: 2.13 m Body Mass Index: 25.89 kg/m  Temp.: 97.25F(Temporal)  Pulse: 73 (Regular)  BP: 118/70 (Sitting, Left Arm, Standard)      Physical Exam [name] MD; 0/04/4095 4:31 PM)  The physical exam findings are as follows: Note:GENERAL: Well-developed, well nourished male in no acute distress  EYES: No scleral icterus Pupils equal, lids  normal  EXTERNAL EARS: Intact, no masses or lesions EXTERNAL NOSE: Intact, no masses or lesions MOUTH: Lips - no lesions Dentition - normal for age  RESPIRATORY:  Normal effort, no use of accessory muscles  MUSCULOSKELETAL: Normal gait Grossly normal ROM upper extremities Grossly normal ROM lower extremities  SKIN: Warm and dry Not diaphoretic  PSYCHIATRIC: Normal judgement and insight Normal mood and affect Alert, oriented x 3  Rectal Note: Left lateral perianal region: There are scars at the left superior and inferior gluteal crease from prior I&Ds There is an area of firmness between the scars - NO overlying erythema, No drainage could be expressed    Assessment & Plan [name] MD; 08/24/6718 4:33 PM)  ABSCESS, PERIRECTAL (K61.1) Impression: 52 year 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 On exam he has significant induration of the left lateral perianal region. There are several open wounds in this area which did not drain any purulence. I am concerned that he either has a perianal fistula that has formed or he has an undrained perirectal abscess. I have recommended an exam under anesthesia with possible incision and drainage. If the fistula is found we will either perform a fistulotomy or place a seton. He understands there is a small risk of incontinence with a fistulotomy and if a seton is placed he will most likely need an additional surgery in approximately 3 months. All questions were answered. I believe he understands all this and wishes to proceed with surgery.

## 2017-10-29 ENCOUNTER — Encounter: Payer: Self-pay | Admitting: Nurse Practitioner

## 2017-10-29 ENCOUNTER — Ambulatory Visit: Payer: Self-pay | Attending: Nurse Practitioner | Admitting: Nurse Practitioner

## 2017-10-29 VITALS — BP 128/91 | HR 62 | Temp 98.5°F | Ht 72.0 in | Wt 198.8 lb

## 2017-10-29 DIAGNOSIS — E785 Hyperlipidemia, unspecified: Secondary | ICD-10-CM | POA: Insufficient documentation

## 2017-10-29 DIAGNOSIS — R7303 Prediabetes: Secondary | ICD-10-CM | POA: Insufficient documentation

## 2017-10-29 DIAGNOSIS — E890 Postprocedural hypothyroidism: Secondary | ICD-10-CM | POA: Insufficient documentation

## 2017-10-29 DIAGNOSIS — Z79899 Other long term (current) drug therapy: Secondary | ICD-10-CM | POA: Insufficient documentation

## 2017-10-29 DIAGNOSIS — Z823 Family history of stroke: Secondary | ICD-10-CM | POA: Insufficient documentation

## 2017-10-29 DIAGNOSIS — N529 Male erectile dysfunction, unspecified: Secondary | ICD-10-CM | POA: Insufficient documentation

## 2017-10-29 DIAGNOSIS — Z7982 Long term (current) use of aspirin: Secondary | ICD-10-CM | POA: Insufficient documentation

## 2017-10-29 DIAGNOSIS — M549 Dorsalgia, unspecified: Secondary | ICD-10-CM | POA: Insufficient documentation

## 2017-10-29 DIAGNOSIS — I10 Essential (primary) hypertension: Secondary | ICD-10-CM | POA: Insufficient documentation

## 2017-10-29 DIAGNOSIS — G8929 Other chronic pain: Secondary | ICD-10-CM | POA: Insufficient documentation

## 2017-10-29 LAB — POCT GLYCOSYLATED HEMOGLOBIN (HGB A1C): Hemoglobin A1C: 5.3 % (ref 4.0–5.6)

## 2017-10-29 LAB — GLUCOSE, POCT (MANUAL RESULT ENTRY): POC GLUCOSE: 106 mg/dL — AB (ref 70–99)

## 2017-10-29 NOTE — Progress Notes (Signed)
Assessment & Plan:  [name] was seen today for establish care.  Diagnoses and all orders for this visit:  Prediabetes -     Glucose (CBG) -     HgB A1c Continue blood sugar control as discussed in office today, low carbohydrate diet, and regular physical exercise as tolerated, 150 minutes per week (30 min each day,  Essential hypertension Continue all antihypertensives as prescribed.  Remember to bring in your blood pressure log with you for your follow up appointment.  DASH/Mediterranean Diets are healthier choices for HTN.   Hyperlipidemia Fasting lipid panel pending INSTRUCTIONS: Work on a low fat, heart healthy diet and participate in regular aerobic exercise program by working out at least 150 minutes per week; 5 days a week-30 minutes per day. Avoid red meat, fried foods. junk foods, sodas, sugary drinks, unhealthy snacking, alcohol and smoking.  Drink at least 48oz of water per day and monitor your carbohydrate intake daily.   Patient has been counseled on age-appropriate routine health concerns for screening and prevention. These are reviewed and up-to-date. Referrals have been placed accordingly. Immunizations are up-to-date or declined.    Subjective:   Chief Complaint  Patient presents with  . Establish Care    Pt. is here to establish care for hypertension.    HPI Corey Hale 52 y.o. male presents to office today to establish care. He has history of perirectal abscess (I&D under [place] 07-22-2017), Goiter with thyroidectomy, HSV, HTN, Prediabetes and HPL. He also has chronic back pain and is currently prescribed Oxycodone. If he would like to continue Oxy he will need to see a pain specialist.   CHRONIC HYPERTENSION Disease Monitoring  Blood pressure range BP Readings from Last 3 Encounters:  10/29/17 (!) 128/91  09/01/17 (!) 117/92  07/24/17 136/82   Chest pain: no   Dyspnea: no   Claudication: no  Medication compliance: yes, taking Tenoretic 50-25 mg  daily. Medication Side Effects  Lightheadedness: no   Urinary frequency: no   Edema: no   Impotence: yes  Preventitive Healthcare:  Exercise: no   Diet Pattern: diet: general  Salt Restriction:  no  BP Readings from Last 3 Encounters:  10/29/17 (!) 128/91  09/01/17 (!) 117/92  07/24/17 136/82    Prediabetes Chronic. Well controlled with diet. Denies any hypo or hyperglycemic symptoms.  Lab Results  Component Value Date   HGBA1C 5.3 10/29/2017   Lab Results  Component Value Date   HGBA1C 5.7 (H) 06/10/2012    Hyperlipidemia Patient presents for follow up to hyperlipidemia.  He is medication compliant taking pravastatin 20mg  daily. He is diet compliant and denies skin xanthelasma or statin intolerance including myalgias.  Lab Results  Component Value Date   CHOL 210 (H) 06/27/2016   Lab Results  Component Value Date   HDL 37 (L) 06/27/2016   Lab Results  Component Value Date   LDLCALC 132 (H) 06/27/2016   Lab Results  Component Value Date   TRIG 204 (H) 06/27/2016   Lab Results  Component Value Date   CHOLHDL 5.7 (H) 06/27/2016    Review of Systems  Constitutional: Negative for fever, malaise/fatigue and weight loss.  HENT: Negative.  Negative for nosebleeds.   Eyes: Negative.  Negative for blurred vision, double vision and photophobia.  Respiratory: Negative.  Negative for cough and shortness of breath.   Cardiovascular: Negative.  Negative for chest pain, palpitations and leg swelling.  Gastrointestinal: Negative.  Negative for heartburn, nausea and vomiting.  Genitourinary:  ED  Musculoskeletal: Negative.  Negative for myalgias.  Neurological: Negative.  Negative for dizziness, focal weakness, seizures and headaches.  Psychiatric/Behavioral: Negative.  Negative for suicidal ideas.    Past Medical History:  Diagnosis Date  . Chronic back pain   . Erectile dysfunction   . Goiter   . Herpes simplex   . Hypercholesteremia   . Hypertension   .  Perirectal abscess   . Skin abscess   . Thyroid disease   . Tobacco dependence     Past Surgical History:  Procedure Laterality Date  . ARM HARDWARE REMOVAL Right   . INCISION AND DRAINAGE PERIRECTAL ABSCESS N/A 07/22/2017   Procedure: IRRIGATION, DEBRIDEMENT AND DRAINAGE PERIRECTAL ABSCESS;  Surgeon: [name], MD;  Location: [facility];  Service: General;  Laterality: N/A;  . NECK SURGERY    . PAROTIDECTOMY     Dr Redmond Baseman  . THYROIDECTOMY, PARTIAL  2008   Dr Marlou Starks    Family History  Problem Relation Age of Onset  . Stroke Mother   . Stroke Father     Social History Reviewed with no changes to be made today.   Outpatient Medications Prior to Visit  Medication Sig Dispense Refill  . aspirin 81 MG tablet Take 1 tablet (81 mg total) by mouth daily. 90 tablet 3  . atenolol-chlorthalidone (TENORETIC) 50-25 MG tablet Take 1 tablet by mouth daily. 90 tablet 3  . pravastatin (PRAVACHOL) 20 MG tablet TAKE 1/2 TABLET BY MOUTH DAILY. 15 tablet 0  . valACYclovir (VALTREX) 1000 MG tablet TAKE 1 TABLET BY MOUTH DAILY 30 tablet 12  . oxyCODONE (ROXICODONE) 15 MG immediate release tablet Take 15 mg by mouth as needed.  0  . docusate sodium (COLACE) 100 MG capsule Take 1 capsule (100 mg total) by mouth 2 (two) times daily. (Patient not taking: Reported on 10/29/2017) 10 capsule 0  . ibuprofen (ADVIL,MOTRIN) 800 MG tablet Take 1 tablet (800 mg total) by mouth every 8 (eight) hours as needed. (Patient not taking: Reported on 10/29/2017) 21 tablet 0  . sildenafil (VIAGRA) 100 MG tablet Take 0.5-1 tablets (50-100 mg total) by mouth daily as needed for erectile dysfunction. (Patient not taking: Reported on 07/21/2017) 30 tablet 3  . Talc-Starch (LAMISIL AT) POWD Apply to affected area (left Foot) twice a day. (Patient not taking: Reported on 10/29/2017) 100 g 1   No facility-administered medications prior to visit.     No Known Allergies     Objective:    BP (!) 128/91 (BP Location: Left Arm, Patient  Position: Sitting, Cuff Size: Normal)   Pulse 62   Temp 98.5 F (36.9 C) (Oral)   Ht 6' (1.829 m)   Wt 198 lb 12.8 oz (90.2 kg)   SpO2 98%   BMI 26.96 kg/m  Wt Readings from Last 3 Encounters:  10/29/17 198 lb 12.8 oz (90.2 kg)  09/01/17 206 lb (93.4 kg)  07/13/17 205 lb (93 kg)    Physical Exam  Constitutional: He is oriented to person, place, and time. He appears well-developed and well-nourished. He is cooperative.  HENT:  Head: Normocephalic and atraumatic.  Eyes: EOM are normal.  Neck: Normal range of motion.  Cardiovascular: Normal rate, regular rhythm and normal heart sounds. Exam reveals no gallop and no friction rub.  No murmur heard. Pulmonary/Chest: Effort normal and breath sounds normal. No tachypnea. No respiratory distress. He has no decreased breath sounds. He has no wheezes. He has no rhonchi. He has no rales. He exhibits no  tenderness.  Abdominal: Bowel sounds are normal.  Musculoskeletal: Normal range of motion. He exhibits no edema.  Neurological: He is alert and oriented to person, place, and time. Coordination normal.  Skin: Skin is warm and dry.  Psychiatric: He has a normal mood and affect. His behavior is normal. Judgment and thought content normal.  Nursing note and vitals reviewed.      Patient has been counseled extensively about nutrition and exercise as well as the importance of adherence with medications and regular follow-up. The patient was given clear instructions to go to ER or return to medical center if symptoms don't improve, worsen or new problems develop. The patient verbalized understanding.   Follow-up: Return for physical and fasting lab.   Gildardo Pounds, FNP-BC [facility] [place], [place]   10/29/2017, 2:38 PM

## 2017-11-14 MED FILL — valACYclovir HCL 1 GM TABS: 1 | 30 days supply | Qty: 30 | Fill #4

## 2017-11-14 MED FILL — ATENOLOL/CHLORTHAL 50/25: 50-25 | 30 days supply | Qty: 30 | Fill #7

## 2017-11-15 ENCOUNTER — Other Ambulatory Visit: Payer: Self-pay | Admitting: Family Medicine

## 2017-11-15 DIAGNOSIS — E785 Hyperlipidemia, unspecified: Secondary | ICD-10-CM

## 2017-11-15 MED FILL — PRAVASTATIN SODIUM 20 MG TA: 20 | 30 days supply | Qty: 15 | Fill #0

## 2017-12-02 ENCOUNTER — Encounter: Payer: Medicaid Other | Admitting: Nurse Practitioner

## 2017-12-19 MED FILL — ATENOLOL/CHLORTHAL 50/25: 50-25 | 30 days supply | Qty: 30 | Fill #8

## 2017-12-19 MED FILL — valACYclovir HCL 1 GM TABS: 1 | 30 days supply | Qty: 30 | Fill #5

## 2017-12-20 ENCOUNTER — Other Ambulatory Visit: Payer: Self-pay | Admitting: Nurse Practitioner

## 2017-12-20 DIAGNOSIS — E785 Hyperlipidemia, unspecified: Secondary | ICD-10-CM

## 2017-12-20 MED FILL — PRAVASTATIN SODIUM 20 MG TA: 20 | 30 days supply | Qty: 15 | Fill #0

## 2017-12-27 ENCOUNTER — Ambulatory Visit: Payer: Medicaid Other | Attending: Nurse Practitioner | Admitting: Nurse Practitioner

## 2017-12-27 ENCOUNTER — Encounter: Payer: Self-pay | Admitting: Nurse Practitioner

## 2017-12-27 VITALS — BP 123/85 | HR 61 | Temp 98.2°F | Ht 72.0 in | Wt 196.2 lb

## 2017-12-27 DIAGNOSIS — N529 Male erectile dysfunction, unspecified: Secondary | ICD-10-CM | POA: Insufficient documentation

## 2017-12-27 DIAGNOSIS — L989 Disorder of the skin and subcutaneous tissue, unspecified: Secondary | ICD-10-CM | POA: Insufficient documentation

## 2017-12-27 DIAGNOSIS — E7800 Pure hypercholesterolemia, unspecified: Secondary | ICD-10-CM | POA: Insufficient documentation

## 2017-12-27 DIAGNOSIS — E041 Nontoxic single thyroid nodule: Secondary | ICD-10-CM | POA: Insufficient documentation

## 2017-12-27 DIAGNOSIS — E876 Hypokalemia: Secondary | ICD-10-CM

## 2017-12-27 DIAGNOSIS — I10 Essential (primary) hypertension: Secondary | ICD-10-CM | POA: Insufficient documentation

## 2017-12-27 DIAGNOSIS — Z0000 Encounter for general adult medical examination without abnormal findings: Secondary | ICD-10-CM | POA: Insufficient documentation

## 2017-12-27 DIAGNOSIS — Z79899 Other long term (current) drug therapy: Secondary | ICD-10-CM | POA: Insufficient documentation

## 2017-12-27 DIAGNOSIS — B009 Herpesviral infection, unspecified: Secondary | ICD-10-CM | POA: Insufficient documentation

## 2017-12-27 DIAGNOSIS — D729 Disorder of white blood cells, unspecified: Secondary | ICD-10-CM

## 2017-12-27 DIAGNOSIS — Z1211 Encounter for screening for malignant neoplasm of colon: Secondary | ICD-10-CM | POA: Insufficient documentation

## 2017-12-27 DIAGNOSIS — G8929 Other chronic pain: Secondary | ICD-10-CM | POA: Insufficient documentation

## 2017-12-27 DIAGNOSIS — E079 Disorder of thyroid, unspecified: Secondary | ICD-10-CM | POA: Insufficient documentation

## 2017-12-27 DIAGNOSIS — F17200 Nicotine dependence, unspecified, uncomplicated: Secondary | ICD-10-CM | POA: Insufficient documentation

## 2017-12-27 DIAGNOSIS — M549 Dorsalgia, unspecified: Secondary | ICD-10-CM | POA: Insufficient documentation

## 2017-12-27 DIAGNOSIS — Z7982 Long term (current) use of aspirin: Secondary | ICD-10-CM | POA: Insufficient documentation

## 2017-12-27 MED ORDER — MUPIROCIN 2 % EX OINT: TOPICAL_OINTMENT | CUTANEOUS | 0 refills | Status: DC

## 2017-12-27 MED FILL — MUPIROCIN 2% OINTMENT: 2 | 10 days supply | Qty: 22 | Fill #0

## 2017-12-27 NOTE — Progress Notes (Signed)
Assessment & Plan:  Corey Hale was seen today for annual exam.  Diagnoses and all orders for this visit:  Encounter for routine history and physical exam for male -     Lipid panel  Colon cancer screening -     Fecal occult blood, imunochemical([facility])  Abnormal WBC count -     CBC  Hypokalemia -     CMP14+EGFR  Skin lesion -     mupirocin ointment (BACTROBAN) 2 %; Apply to affected area twice per day    Patient has been counseled on age-appropriate routine health concerns for screening and prevention. These are reviewed and up-to-date. Referrals have been placed accordingly. Immunizations are up-to-date or declined.    Subjective:   Chief Complaint  Patient presents with  . Annual Exam    Pt. is here for a physical and only took his medication this morning.    HPI Corey Hale 52 y.o. male presents to office today   Review of Systems  Constitutional: Negative for fever, malaise/fatigue and weight loss.  HENT: Negative.  Negative for nosebleeds.   Eyes: Negative.  Negative for blurred vision, double vision and photophobia.  Respiratory: Negative.  Negative for cough and shortness of breath.   Cardiovascular: Negative.  Negative for chest pain, palpitations and leg swelling.  Gastrointestinal: Negative.  Negative for heartburn, nausea and vomiting.  Genitourinary: Negative.   Musculoskeletal: Negative.  Negative for myalgias.  Skin: Negative.   Neurological: Negative.  Negative for dizziness, focal weakness, seizures and headaches.  Endo/Heme/Allergies: Negative.   Psychiatric/Behavioral: Negative.  Negative for suicidal ideas.    Past Medical History:  Diagnosis Date  . Chronic back pain   . Erectile dysfunction   . Goiter   . Herpes simplex   . Hypercholesteremia   . Hypertension   . Perirectal abscess   . Skin abscess   . Thyroid disease   . Tobacco dependence     Past Surgical History:  Procedure Laterality Date  . ARM HARDWARE REMOVAL  Right   . INCISION AND DRAINAGE PERIRECTAL ABSCESS N/A 07/22/2017   Procedure: IRRIGATION, DEBRIDEMENT AND DRAINAGE PERIRECTAL ABSCESS;  Surgeon: [name], MD;  Location: [place];  Service: General;  Laterality: N/A;  . NECK SURGERY    . PAROTIDECTOMY     Dr Redmond Baseman  . THYROIDECTOMY, PARTIAL  2008   Dr Marlou Starks    Family History  Problem Relation Age of Onset  . Stroke Mother   . Stroke Father     Social History Reviewed with no changes to be made today.   Outpatient Medications Prior to Visit  Medication Sig Dispense Refill  . aspirin 81 MG tablet Take 1 tablet (81 mg total) by mouth daily. 90 tablet 3  . atenolol-chlorthalidone (TENORETIC) 50-25 MG tablet Take 1 tablet by mouth daily. 90 tablet 3  . oxyCODONE (ROXICODONE) 15 MG immediate release tablet Take 15 mg by mouth as needed.  0  . pravastatin (PRAVACHOL) 20 MG tablet TAKE 1/2 TABLET BY MOUTH DAILY. 15 tablet 0  . valACYclovir (VALTREX) 1000 MG tablet TAKE 1 TABLET BY MOUTH DAILY 30 tablet 12   No facility-administered medications prior to visit.     No Known Allergies     Objective:    BP 123/85 (BP Location: Left Arm, Patient Position: Sitting, Cuff Size: Normal)   Pulse 61   Temp 98.2 F (36.8 C) (Oral)   Ht 6' (1.829 m)   Wt 196 lb 3.2 oz (89 kg)   SpO2  99%   BMI 26.61 kg/m  Wt Readings from Last 3 Encounters:  12/27/17 196 lb 3.2 oz (89 kg)  10/29/17 198 lb 12.8 oz (90.2 kg)  09/01/17 206 lb (93.4 kg)    Physical Exam  Constitutional: He is oriented to person, place, and time. He appears well-developed and well-nourished.  HENT:  Head: Normocephalic and atraumatic.  Right Ear: External ear and ear canal normal. Decreased hearing is noted.  Left Ear: External ear and ear canal normal. Decreased hearing is noted.  Nose: Nose normal. No mucosal edema or rhinorrhea.  Mouth/Throat: Uvula is midline, oropharynx is clear and moist and mucous membranes are normal. Tonsils are 1+ on the right. Tonsils are 1+ on  the left. No tonsillar exudate.  Unable to visualize bilateral TM due to excessive cerumen production  Eyes: Pupils are equal, round, and reactive to light. Conjunctivae, EOM and lids are normal. No scleral icterus.  Neck: Normal range of motion. Neck supple. No tracheal deviation present. No thyromegaly present.  Cardiovascular: Normal rate, regular rhythm, normal heart sounds and intact distal pulses. Exam reveals no gallop and no friction rub.  No murmur heard. Pulmonary/Chest: Effort normal and breath sounds normal. No respiratory distress. He has no wheezes. He has no rales. He exhibits no mass and no tenderness. Right breast exhibits no inverted nipple, no mass, no nipple discharge, no skin change and no tenderness. Left breast exhibits no inverted nipple, no mass, no nipple discharge, no skin change and no tenderness.  Abdominal: Soft. Bowel sounds are normal. He exhibits no distension and no mass. There is no tenderness. There is no rebound and no guarding. Hernia confirmed negative in the right inguinal area and confirmed negative in the left inguinal area.  Genitourinary: Penis normal. Right testis shows no mass, no swelling and no tenderness. Cremasteric reflex is not absent on the right side. Left testis shows no mass, no swelling and no tenderness. Cremasteric reflex is not absent on the left side. Circumcised.  Musculoskeletal: Normal range of motion. He exhibits no edema, tenderness or deformity.  Lymphadenopathy:    He has no cervical adenopathy. No inguinal adenopathy noted on the right or left side.       Right: No inguinal adenopathy present.       Left: No inguinal adenopathy present.  Neurological: He is alert and oriented to person, place, and time. He has normal strength. He displays no tremor and normal reflexes. No cranial nerve deficit or sensory deficit. He exhibits normal muscle tone. He displays a negative Romberg sign. Coordination and gait normal.  Reflex Scores:       Patellar reflexes are 2+ on the right side and 2+ on the left side. Skin: Skin is warm and dry. Capillary refill takes less than 2 seconds. Lesion noted. No rash noted. Rash is not macular. There is erythema.     Psychiatric: He has a normal mood and affect. His behavior is normal. Judgment and thought content normal.       Patient has been counseled extensively about nutrition and exercise as well as the importance of adherence with medications and regular follow-up. The patient was given clear instructions to go to ER or return to medical center if symptoms don't improve, worsen or new problems develop. The patient verbalized understanding.   Follow-up: Return if symptoms worsen or fail to improve, for make appt for ear wax removal .   Gildardo Pounds, FNP-BC [facility] and [facility] [place], [place]  12/27/2017, 10:05 AM

## 2017-12-27 NOTE — Patient Instructions (Signed)
Coping with Quitting Smoking Quitting smoking is a physical and mental challenge. You will face cravings, withdrawal symptoms, and temptation. Before quitting, work with your health care provider to make a plan that can help you cope. Preparation can help you quit and keep you from giving in. How can I cope with cravings? Cravings usually last for 5-10 minutes. If you get through it, the craving will pass. Consider taking the following actions to help you cope with cravings:  Keep your mouth busy: ? Chew sugar-free gum. ? Suck on hard candies or a straw. ? Brush your teeth.  Keep your hands and body busy: ? Immediately change to a different activity when you feel a craving. ? Squeeze or play with a ball. ? Do an activity or a hobby, like making bead jewelry, practicing needlepoint, or working with wood. ? Mix up your normal routine. ? Take a short exercise break. Go for a quick walk or run up and down stairs. ? Spend time in public places where smoking is not allowed.  Focus on doing something kind or helpful for someone else.  Call a friend or family member to talk during a craving.  Join a support group.  Call a quit line, such as 1-800-QUIT-NOW.  Talk with your health care provider about medicines that might help you cope with cravings and make quitting easier for you.  How can I deal with withdrawal symptoms? Your body may experience negative effects as it tries to get used to not having nicotine in the system. These effects are called withdrawal symptoms. They may include:  Feeling hungrier than normal.  Trouble concentrating.  Irritability.  Trouble sleeping.  Feeling depressed.  Restlessness and agitation.  Craving a cigarette.  To manage withdrawal symptoms:  Avoid places, people, and activities that trigger your cravings.  Remember why you want to quit.  Get plenty of sleep.  Avoid coffee and other caffeinated drinks. These may worsen some of your  symptoms.  How can I handle social situations? Social situations can be difficult when you are quitting smoking, especially in the first few weeks. To manage this, you can:  Avoid parties, bars, and other social situations where people might be smoking.  Avoid alcohol.  Leave right away if you have the urge to smoke.  Explain to your family and friends that you are quitting smoking. Ask for understanding and support.  Plan activities with friends or family where smoking is not an option.  What are some ways I can cope with stress? Wanting to smoke may cause stress, and stress can make you want to smoke. Find ways to manage your stress. Relaxation techniques can help. For example:  Breathe slowly and deeply, in through your nose and out through your mouth.  Listen to soothing, relaxing music.  Talk with a family member or friend about your stress.  Light a candle.  Soak in a bath or take a shower.  Think about a peaceful place.  What are some ways I can prevent weight gain? Be aware that many people gain weight after they quit smoking. However, not everyone does. To keep from gaining weight, have a plan in place before you quit and stick to the plan after you quit. Your plan should include:  Having healthy snacks. When you have a craving, it may help to: ? Eat plain popcorn, crunchy carrots, celery, or other cut vegetables. ? Chew sugar-free gum.  Changing how you eat: ? Eat small portion sizes at meals. ?   Eat 4-6 small meals throughout the day instead of 1-2 large meals a day. ? Be mindful when you eat. Do not watch television or do other things that might distract you as you eat.  Exercising regularly: ? Make time to exercise each day. If you do not have time for a long workout, do short bouts of exercise for 5-10 minutes several times a day. ? Do some form of strengthening exercise, like weight lifting, and some form of aerobic exercise, like running or  swimming.  Drinking plenty of water or other low-calorie or no-calorie drinks. Drink 6-8 glasses of water daily, or as much as instructed by your health care provider.  Summary  Quitting smoking is a physical and mental challenge. You will face cravings, withdrawal symptoms, and temptation to smoke again. Preparation can help you as you go through these challenges.  You can cope with cravings by keeping your mouth busy (such as by chewing gum), keeping your body and hands busy, and making calls to family, friends, or a helpline for people who want to quit smoking.  You can cope with withdrawal symptoms by avoiding places where people smoke, avoiding drinks with caffeine, and getting plenty of rest.  Ask your health care provider about the different ways to prevent weight gain, avoid stress, and handle social situations. This information is not intended to replace advice given to you by your health care provider. Make sure you discuss any questions you have with your health care provider. Document Released: 03/02/2016 Document Revised: 03/02/2016 Document Reviewed: 03/02/2016 Elsevier Interactive Patient Education  2018 [employer].  Steps to Quit Smoking Smoking tobacco can be bad for your health. It can also affect almost every organ in your body. Smoking puts you and people around you at risk for many serious long-lasting (chronic) diseases. Quitting smoking is hard, but it is one of the best things that you can do for your health. It is never too late to quit. What are the benefits of quitting smoking? When you quit smoking, you lower your risk for getting serious diseases and conditions. They can include:  Lung cancer or lung disease.  Heart disease.  Stroke.  Heart attack.  Not being able to have children (infertility).  Weak bones (osteoporosis) and broken bones (fractures).  If you have coughing, wheezing, and shortness of breath, those symptoms may get better when you  quit. You may also get sick less often. If you are pregnant, quitting smoking can help to lower your chances of having a baby of low birth weight. What can I do to help me quit smoking? Talk with your doctor about what can help you quit smoking. Some things you can do (strategies) include:  Quitting smoking totally, instead of slowly cutting back how much you smoke over a period of time.  Going to in-person counseling. You are more likely to quit if you go to many counseling sessions.  Using resources and support systems, such as: ? Database administrator with a Social worker. ? Phone quitlines. ? Careers information officer. ? Support groups or group counseling. ? Text messaging programs. ? Mobile phone apps or applications.  Taking medicines. Some of these medicines may have nicotine in them. If you are pregnant or breastfeeding, do not take any medicines to quit smoking unless your doctor says it is okay. Talk with your doctor about counseling or other things that can help you.  Talk with your doctor about using more than one strategy at the same time, such as  taking medicines while you are also going to in-person counseling. This can help make quitting easier. What things can I do to make it easier to quit? Quitting smoking might feel very hard at first, but there is a lot that you can do to make it easier. Take these steps:  Talk to your family and friends. Ask them to support and encourage you.  Call phone quitlines, reach out to support groups, or work with a Social worker.  Ask people who smoke to not smoke around you.  Avoid places that make you want (trigger) to smoke, such as: ? Bars. ? Parties. ? Smoke-break areas at work.  Spend time with people who do not smoke.  Lower the stress in your life. Stress can make you want to smoke. Try these things to help your stress: ? Getting regular exercise. ? Deep-breathing exercises. ? Yoga. ? Meditating. ? Doing a body scan. To do this, close  your eyes, focus on one area of your body at a time from head to toe, and notice which parts of your body are tense. Try to relax the muscles in those areas.  Download or buy apps on your mobile phone or tablet that can help you stick to your quit plan. There are many free apps, such as QuitGuide from the [employer] Office manager for Disease Control and Prevention). You can find more support from smokefree.gov and other websites.  This information is not intended to replace advice given to you by your health care provider. Make sure you discuss any questions you have with your health care provider. Document Released: 12/30/2008 Document Revised: 11/01/2015 Document Reviewed: 07/20/2014 Elsevier Interactive Patient Education  2018 [employer].

## 2017-12-28 LAB — CBC
HEMATOCRIT: 42.6 % (ref 37.5–51.0)
Hemoglobin: 14.5 g/dL (ref 13.0–17.7)
MCH: 29.5 pg (ref 26.6–33.0)
MCHC: 34 g/dL (ref 31.5–35.7)
MCV: 87 fL (ref 79–97)
PLATELETS: 248 10*3/uL (ref 150–450)
RBC: 4.91 x10E6/uL (ref 4.14–5.80)
RDW: 14.1 % (ref 12.3–15.4)
WBC: 7.1 10*3/uL (ref 3.4–10.8)

## 2017-12-28 LAB — LIPID PANEL
CHOL/HDL RATIO: 5 ratio (ref 0.0–5.0)
Cholesterol, Total: 199 mg/dL (ref 100–199)
HDL: 40 mg/dL (ref >39)
LDL Calculated: 140 mg/dL — ABNORMAL HIGH (ref 0–99)
TRIGLYCERIDES: 97 mg/dL (ref 0–149)
VLDL Cholesterol Cal: 19 mg/dL (ref 5–40)

## 2017-12-28 LAB — CMP14+EGFR
A/G RATIO: 1.5 (ref 1.2–2.2)
ALT: 13 IU/L (ref 0–44)
AST: 14 IU/L (ref 0–40)
Albumin: 4.4 g/dL (ref 3.5–5.5)
Alkaline Phosphatase: 74 IU/L (ref 39–117)
BUN/Creatinine Ratio: 12 (ref 9–20)
BUN: 11 mg/dL (ref 6–24)
Bilirubin Total: 0.4 mg/dL (ref 0.0–1.2)
CALCIUM: 9.8 mg/dL (ref 8.7–10.2)
CO2: 27 mmol/L (ref 20–29)
Chloride: 96 mmol/L (ref 96–106)
Creatinine, Ser: 0.91 mg/dL (ref 0.76–1.27)
GFR, EST AFRICAN AMERICAN: 112 mL/min/{1.73_m2} (ref >59)
GFR, EST NON AFRICAN AMERICAN: 97 mL/min/{1.73_m2} (ref >59)
GLOBULIN, TOTAL: 3 g/dL (ref 1.5–4.5)
Glucose: 81 mg/dL (ref 65–99)
POTASSIUM: 3.7 mmol/L (ref 3.5–5.2)
SODIUM: 140 mmol/L (ref 134–144)
Total Protein: 7.4 g/dL (ref 6.0–8.5)

## 2017-12-30 ENCOUNTER — Encounter: Payer: Medicaid Other | Admitting: Nurse Practitioner

## 2018-01-01 ENCOUNTER — Telehealth: Payer: Self-pay

## 2018-01-01 NOTE — Telephone Encounter (Signed)
CMA spoke to patient to inform on results.  Pt. Verified DOB. Pt. Understood.  

## 2018-01-01 NOTE — Telephone Encounter (Signed)
-----   Message from Corey Pounds, NP sent at 12/31/2017 10:40 PM EDT ----- Your labs are essentially normal. You do not have anemia, your kidney and liver function are normal. Your LDL cholesterol is elevated. Continue taking your cholesterol medication daily. INSTRUCTIONS: Work on a low fat, heart healthy diet and participate in regular aerobic exercise program by working out at least 150 minutes per week; 5 days a week-30 minutes per day. Avoid red meat, fried foods. junk foods, sodas, sugary drinks, unhealthy snacking, alcohol and smoking.  Drink at least 48oz of water per day and monitor your carbohydrate intake daily.

## 2018-01-21 ENCOUNTER — Other Ambulatory Visit: Payer: Self-pay | Admitting: Nurse Practitioner

## 2018-01-21 DIAGNOSIS — E785 Hyperlipidemia, unspecified: Secondary | ICD-10-CM

## 2018-01-21 MED FILL — valACYclovir HCL 1 GM TABS: 1 | 30 days supply | Qty: 30 | Fill #6

## 2018-01-21 MED FILL — ATENOLOL/CHLORTHAL 50/25: 50-25 | 30 days supply | Qty: 30 | Fill #9

## 2018-01-22 MED FILL — PRAVASTATIN SODIUM 20 MG TA: 20 | 30 days supply | Qty: 15 | Fill #0

## 2018-01-30 ENCOUNTER — Ambulatory Visit: Payer: Self-pay | Attending: Family Medicine

## 2018-02-20 MED FILL — ATENOLOL/CHLORTHAL 50/25: 50-25 | 30 days supply | Qty: 30 | Fill #10

## 2018-02-20 MED FILL — PRAVASTATIN SODIUM 20 MG TA: 20 | 30 days supply | Qty: 15 | Fill #1

## 2018-02-20 MED FILL — valACYclovir HCL 1 GM TABS: 1 | 30 days supply | Qty: 30 | Fill #7

## 2018-03-21 MED FILL — ATENOLOL/CHLORTHAL 50/25: 50-25 | 30 days supply | Qty: 30 | Fill #11

## 2018-03-21 MED FILL — valACYclovir HCL 1 GM TABS: 1 | 30 days supply | Qty: 30 | Fill #8

## 2018-03-21 MED FILL — PRAVASTATIN SODIUM 20 MG TA: 20 | 30 days supply | Qty: 15 | Fill #2

## 2018-04-21 ENCOUNTER — Other Ambulatory Visit: Payer: Self-pay | Admitting: Internal Medicine

## 2018-04-21 ENCOUNTER — Other Ambulatory Visit: Payer: Self-pay | Admitting: Family Medicine

## 2018-04-21 DIAGNOSIS — E785 Hyperlipidemia, unspecified: Secondary | ICD-10-CM

## 2018-04-21 DIAGNOSIS — I10 Essential (primary) hypertension: Secondary | ICD-10-CM

## 2018-04-21 MED FILL — valACYclovir HCL 1 GM TABS: 1 | 30 days supply | Qty: 30 | Fill #9

## 2018-04-22 MED FILL — ?PRAVASTATIN SODIUM 20MG TA: 20 | 30 days supply | Qty: 15 | Fill #0

## 2018-04-22 MED FILL — ATENOLOL/CHLORTHAL 50/25: 50-25 | 30 days supply | Qty: 30 | Fill #0

## 2018-05-21 ENCOUNTER — Other Ambulatory Visit: Payer: Self-pay | Admitting: Internal Medicine

## 2018-05-21 ENCOUNTER — Other Ambulatory Visit: Payer: Self-pay | Admitting: Nurse Practitioner

## 2018-05-21 DIAGNOSIS — I10 Essential (primary) hypertension: Secondary | ICD-10-CM

## 2018-05-21 MED FILL — ?PRAVASTATIN SODIUM 20MG TA: 20 | 30 days supply | Qty: 15 | Fill #1

## 2018-05-27 ENCOUNTER — Other Ambulatory Visit: Payer: Self-pay | Admitting: Nurse Practitioner

## 2018-05-27 DIAGNOSIS — I10 Essential (primary) hypertension: Secondary | ICD-10-CM

## 2018-05-30 MED FILL — ATENOLOL/CHLORTHAL 50/25: 50-25 | 30 days supply | Qty: 30 | Fill #0

## 2018-06-20 ENCOUNTER — Ambulatory Visit: Payer: Self-pay | Attending: Nurse Practitioner | Admitting: Nurse Practitioner

## 2018-06-20 ENCOUNTER — Encounter: Payer: Self-pay | Admitting: Nurse Practitioner

## 2018-06-20 DIAGNOSIS — L853 Xerosis cutis: Secondary | ICD-10-CM

## 2018-06-20 DIAGNOSIS — I10 Essential (primary) hypertension: Secondary | ICD-10-CM

## 2018-06-20 DIAGNOSIS — R0981 Nasal congestion: Secondary | ICD-10-CM

## 2018-06-20 DIAGNOSIS — Z760 Encounter for issue of repeat prescription: Secondary | ICD-10-CM

## 2018-06-20 DIAGNOSIS — E785 Hyperlipidemia, unspecified: Secondary | ICD-10-CM

## 2018-06-20 DIAGNOSIS — A6000 Herpesviral infection of urogenital system, unspecified: Secondary | ICD-10-CM

## 2018-06-20 MED ORDER — ATENOLOL-CHLORTHALIDONE 50-25 MG PO TABS: 1.0000 | ORAL_TABLET | Freq: Every day | ORAL | 0 refills | Status: DC

## 2018-06-20 MED ORDER — VALACYCLOVIR HCL 1 G PO TABS: 1000.0000 mg | ORAL_TABLET | Freq: Every day | ORAL | 3 refills | Status: DC

## 2018-06-20 MED ORDER — FLUTICASONE PROPIONATE 50 MCG/ACT NA SUSP: 2.0000 | Freq: Every day | NASAL | 6 refills | Status: DC

## 2018-06-20 MED ORDER — PRAVASTATIN SODIUM 20 MG PO TABS: 20.0000 mg | ORAL_TABLET | Freq: Every day | ORAL | 2 refills | Status: DC

## 2018-06-20 NOTE — Progress Notes (Signed)
Assessment & Plan:  [name] was seen today for medication refill.  Diagnoses and all orders for this visit:  Essential hypertension -     atenolol-chlorthalidone (TENORETIC) 50-25 MG tablet; Take 1 tablet by mouth daily. Continue all antihypertensives as prescribed.  Remember to bring in your blood pressure log with you for your follow up appointment.  DASH/Mediterranean Diets are healthier choices for HTN.    Dyslipidemia -     pravastatin (PRAVACHOL) 20 MG tablet; Take 1 tablet (20 mg total) by mouth daily. INSTRUCTIONS: Work on a low fat, heart healthy diet and participate in regular aerobic exercise program by working out at least 150 minutes per week; 5 days a week-30 minutes per day. Avoid red meat, fried foods. junk foods, sodas, sugary drinks, unhealthy snacking, alcohol and smoking.  Drink at least 48oz of water per day and monitor your carbohydrate intake daily.    Dry skin Recommended petroleum based lubricant immediately after showering.   Nasal congestion -     fluticasone (FLONASE) 50 MCG/ACT nasal spray; Place 2 sprays into both nostrils daily. Likely allergy related as just started over the past few weeks. He has been taking afrin which I have recommended he stop at this time.  There are no other Upper respiratory symptoms present at this time aside from nasal congestion  Genital herpes simplex, unspecified site -     valACYclovir (VALTREX) 1000 MG tablet; Take 1 tablet (1,000 mg total) by mouth daily. No current flares. Takes daily for preventative   Patient has been counseled on age-appropriate routine health concerns for screening and prevention. These are reviewed and up-to-date. Referrals have been placed accordingly. Immunizations are up-to-date or declined.    Subjective:   Chief Complaint  Patient presents with  . Medication Refill    Pt. want Medication refill and want to PCP to regarding his itchiness on his legs and arm.    HPI Corey Hale 53  y.o. male presents for telehealth call requesting medication refills. He has a history of HTN, mixed hyperlipidemia and has complaints today of dry skin and nasal congestion.    Essential Hypertension Chronic. Well controlled. Monitoring blood pressure at home with average readings 120/80s. Denies chest pain, shortness of breath, palpitations, lightheadedness, dizziness, headaches or BLE edema. Endorses medication compliance taking atenolol-chlorthalidone 50-25 mg daily. He is diet compliant.  BP Readings from Last 3 Encounters:  12/27/17 123/85  10/29/17 (!) 128/91  09/01/17 (!) 117/92   Dry Skin Chronic. Mostly on legs and arms. Other symptoms include pruritis. He denies any scaling, patches, rash or lesions. He does not use a moisturizer, lotion or petroleum based gel on his skin. Does take hot showers. I have instructed him to try a 24hr moisturizer or petroleum based lubricant for his skin immediately after showering to see if this will help with his dry skin.   Dyslipidemia Taking pravastatin 10mg . Unclear why he has been taking half a tablet. I have instructed him to take pravastatin 20mg  daily as instructed. He denies any myalgias or statin intolerance.  Lab Results  Component Value Date   LDLCALC 140 (H) 12/27/2017      Review of Systems  Constitutional: Negative for fever, malaise/fatigue and weight loss.  HENT: Positive for congestion. Negative for nosebleeds.   Eyes: Negative.  Negative for blurred vision, double vision and photophobia.  Respiratory: Negative.  Negative for cough and shortness of breath.   Cardiovascular: Negative.  Negative for chest pain, palpitations and leg swelling.  Gastrointestinal: Negative.  Negative for heartburn, nausea and vomiting.  Musculoskeletal: Negative.  Negative for myalgias.  Skin: Positive for itching.       Dry skin   Neurological: Negative.  Negative for dizziness, focal weakness, seizures and headaches.  Psychiatric/Behavioral:  Negative.  Negative for suicidal ideas.    Past Medical History:  Diagnosis Date  . Chronic back pain   . Erectile dysfunction   . Goiter   . Herpes simplex   . Hypercholesteremia   . Hypertension   . Perirectal abscess   . Skin abscess   . Thyroid disease   . Tobacco dependence     Past Surgical History:  Procedure Laterality Date  . ARM HARDWARE REMOVAL Right   . INCISION AND DRAINAGE PERIRECTAL ABSCESS N/A 07/22/2017   Procedure: IRRIGATION, DEBRIDEMENT AND DRAINAGE PERIRECTAL ABSCESS;  Surgeon: [name], MD;  Location: [place];  Service: General;  Laterality: N/A;  . NECK SURGERY    . PAROTIDECTOMY     Dr Redmond Baseman  . THYROIDECTOMY, PARTIAL  2008   Dr Marlou Starks    Family History  Problem Relation Age of Onset  . Stroke Mother   . Stroke Father     Social History Reviewed with no changes to be made today.   Outpatient Medications Prior to Visit  Medication Sig Dispense Refill  . aspirin 81 MG tablet Take 1 tablet (81 mg total) by mouth daily. 90 tablet 3  . atenolol-chlorthalidone (TENORETIC) 50-25 MG tablet TAKE 1 TABLET BY MOUTH DAILY. MUST MAKE APPT FOR FURTHER REFILLS 30 tablet 0  . oxyCODONE (ROXICODONE) 15 MG immediate release tablet Take 15 mg by mouth as needed.  0  . pravastatin (PRAVACHOL) 20 MG tablet TAKE 1/2 TABLET BY MOUTH DAILY. 15 tablet 2  . valACYclovir (VALTREX) 1000 MG tablet TAKE 1 TABLET BY MOUTH DAILY 30 tablet 12  . mupirocin ointment (BACTROBAN) 2 % Apply to affected area twice per day (Patient not taking: Reported on 06/20/2018) 30 g 0   No facility-administered medications prior to visit.     No Known Allergies     Objective:    There were no vitals taken for this visit. Wt Readings from Last 3 Encounters:  12/27/17 196 lb 3.2 oz (89 kg)  10/29/17 198 lb 12.8 oz (90.2 kg)  09/01/17 206 lb (93.4 kg)        Patient has been counseled extensively about nutrition and exercise as well as the importance of adherence with medications and  regular follow-up. The patient was given clear instructions to go to ER or return to medical center if symptoms don't improve, worsen or new problems develop. The patient verbalized understanding.   Follow-up: Return in about 3 months (around 09/19/2018).   Gildardo Pounds, FNP-BC [facility] and [facility], [place]   06/20/2018, 3:37 PM

## 2018-06-21 MED FILL — FLUTICASONE PROP 50 MCG SPR: 50 | 30 days supply | Qty: 16 | Fill #0

## 2018-06-21 MED FILL — ?PRAVASTATIN SODIUM 20MG TA: 20 | 30 days supply | Qty: 30 | Fill #0

## 2018-06-21 MED FILL — ?VALACYCLOVIR 1 GRAM TAB: 1000 MG | 30 days supply | Qty: 30 | Fill #0

## 2018-06-30 MED FILL — ATENOLOL/CHLORTHAL 50/25: 50-25 | 30 days supply | Qty: 30 | Fill #0

## 2018-07-25 MED FILL — ?PRAVASTATIN NA 20MG TABL: 20 | 30 days supply | Qty: 30 | Fill #1

## 2018-07-25 MED FILL — ?VALACYCLOVIR 1 GRAM TAB: 1000 MG | 30 days supply | Qty: 30 | Fill #1

## 2018-07-25 MED FILL — ATENOLOL/CHLORTHAL 50/25: 50-25 | 30 days supply | Qty: 30 | Fill #1

## 2018-08-26 MED FILL — ATENOLOL/CHLORTHAL 50/25: 50-25 | 30 days supply | Qty: 30 | Fill #2

## 2018-08-26 MED FILL — ?PRAVASTATIN NA 20MG TABL: 20 | 30 days supply | Qty: 30 | Fill #2

## 2018-08-26 MED FILL — ?VALACYCLOVIR 1 GRAM TAB: 1000 MG | 30 days supply | Qty: 30 | Fill #2

## 2018-09-22 ENCOUNTER — Other Ambulatory Visit: Payer: Self-pay | Admitting: Nurse Practitioner

## 2018-09-22 DIAGNOSIS — I10 Essential (primary) hypertension: Secondary | ICD-10-CM

## 2018-09-22 MED FILL — ?PRAVASTATIN NA 20MG TABL: 20 | 30 days supply | Qty: 30 | Fill #3

## 2018-09-22 MED FILL — valACYclovir HCL 1 GM TABS: 1 | 30 days supply | Qty: 30 | Fill #3

## 2018-09-23 MED FILL — ATENOLOL/CHLORTHAL 50/25: 50-25 | 30 days supply | Qty: 30 | Fill #0

## 2018-09-29 ENCOUNTER — Ambulatory Visit: Payer: Medicaid Other | Admitting: Nurse Practitioner

## 2018-10-01 ENCOUNTER — Encounter: Payer: Self-pay | Admitting: Nurse Practitioner

## 2018-10-01 ENCOUNTER — Ambulatory Visit: Payer: Self-pay | Attending: Nurse Practitioner | Admitting: Nurse Practitioner

## 2018-10-01 ENCOUNTER — Other Ambulatory Visit: Payer: Self-pay

## 2018-10-01 VITALS — BP 125/82 | HR 62 | Temp 98.5°F | Ht 72.0 in | Wt 196.0 lb

## 2018-10-01 DIAGNOSIS — H6123 Impacted cerumen, bilateral: Secondary | ICD-10-CM

## 2018-10-01 DIAGNOSIS — I10 Essential (primary) hypertension: Secondary | ICD-10-CM

## 2018-10-01 NOTE — Progress Notes (Signed)
Assessment & Plan:  Corey Hale was seen today for follow-up.  Diagnoses and all orders for this visit:  Essential hypertension  Bilateral impacted cerumen    Patient has been counseled on age-appropriate routine health concerns for screening and prevention. These are reviewed and up-to-date. Referrals have been placed accordingly. Immunizations are up-to-date or declined.    Subjective:   Chief Complaint  Patient presents with  . Follow-up    Pt. is here for a follow up on HTN.    HPI Corey Hale 53 y.o. male presents to office today for HTN. He also has complaints of bilateral decreased hearing.    Essential Hypertension Blood pressure is well controlled. He does not monitor his blood pressure at home. Taking atenolol-chlorthalidone 50-25 mg daily as prescribed. Denies chest pain, shortness of breath, palpitations, lightheadedness, dizziness, headaches or BLE edema.  BP Readings from Last 3 Encounters:  10/01/18 125/82  12/27/17 123/85  10/29/17 (!) 128/91   Cerumen Impaction: Patient presents with diminished hearing for the past several months.  There is no prior history of cerumen impaction.  The patient was not using ear drops to loosen wax immediately prior to this visit.  Review of Systems  Constitutional: Negative for fever, malaise/fatigue and weight loss.  HENT: Positive for hearing loss. Negative for ear discharge, ear pain, nosebleeds and tinnitus.   Eyes: Negative.  Negative for blurred vision, double vision and photophobia.  Respiratory: Negative.  Negative for cough and shortness of breath.   Cardiovascular: Negative.  Negative for chest pain, palpitations and leg swelling.  Gastrointestinal: Negative.  Negative for heartburn, nausea and vomiting.  Musculoskeletal: Negative.  Negative for myalgias.  Neurological: Negative.  Negative for dizziness, focal weakness, seizures and headaches.  Psychiatric/Behavioral: Negative.  Negative for suicidal ideas.     Past Medical History:  Diagnosis Date  . Chronic back pain   . Erectile dysfunction   . Goiter   . Herpes simplex   . Hypercholesteremia   . Hypertension   . Perirectal abscess   . Skin abscess   . Thyroid disease   . Tobacco dependence     Past Surgical History:  Procedure Laterality Date  . ARM HARDWARE REMOVAL Right   . INCISION AND DRAINAGE PERIRECTAL ABSCESS N/A 07/22/2017   Procedure: IRRIGATION, DEBRIDEMENT AND DRAINAGE PERIRECTAL ABSCESS;  Surgeon: [name], MD;  Location: [facility];  Service: General;  Laterality: N/A;  . NECK SURGERY    . PAROTIDECTOMY     Dr Redmond Baseman  . THYROIDECTOMY, PARTIAL  2008   Dr Marlou Starks    Family History  Problem Relation Age of Onset  . Stroke Mother   . Stroke Father     Social History Reviewed with no changes to be made today.   Outpatient Medications Prior to Visit  Medication Sig Dispense Refill  . aspirin 81 MG tablet Take 1 tablet (81 mg total) by mouth daily. 90 tablet 3  . atenolol-chlorthalidone (TENORETIC) 50-25 MG tablet TAKE 1 TABLET BY MOUTH DAILY. 90 tablet 0  . fluticasone (FLONASE) 50 MCG/ACT nasal spray Place 2 sprays into both nostrils daily. 16 g 6  . pravastatin (PRAVACHOL) 20 MG tablet Take 1 tablet (20 mg total) by mouth daily. 90 tablet 2   No facility-administered medications prior to visit.     No Known Allergies     Objective:    BP 125/82 (BP Location: Left Arm, Patient Position: Sitting, Cuff Size: Normal)   Pulse 62   Temp 98.5 F (36.9  C) (Oral)   Ht 6' (1.829 m)   Wt 196 lb (88.9 kg)   SpO2 98%   BMI 26.58 kg/m  Wt Readings from Last 3 Encounters:  10/01/18 196 lb (88.9 kg)  12/27/17 196 lb 3.2 oz (89 kg)  10/29/17 198 lb 12.8 oz (90.2 kg)    Physical Exam Vitals signs and nursing note reviewed.  Constitutional:      Appearance: He is well-developed.  HENT:     Head: Normocephalic and atraumatic.     Right Ear: Decreased hearing noted. A middle ear effusion is present.     Left Ear:  Decreased hearing noted. A middle ear effusion is present.     Ears:     Comments: Left inner ear canal is very narrow and difficult to navigate with otoscope.  Bilateral ears were irrigated by RN. I was able to then visualize the right tympanic membrane which was normal. Due to the anatomy of patient's left inner ear canal I had difficulty visualizing the entire tympanic membrane. Patient did report hearing greatly improved in bilateral ears after irrigation. Neck:     Musculoskeletal: Normal range of motion.  Cardiovascular:     Rate and Rhythm: Normal rate and regular rhythm.     Heart sounds: Normal heart sounds. No murmur. No friction rub. No gallop.   Pulmonary:     Effort: Pulmonary effort is normal. No tachypnea or respiratory distress.     Breath sounds: Normal breath sounds. No decreased breath sounds, wheezing, rhonchi or rales.  Chest:     Chest wall: No tenderness.  Abdominal:     General: Bowel sounds are normal.     Palpations: Abdomen is soft.  Musculoskeletal: Normal range of motion.  Skin:    General: Skin is warm and dry.  Neurological:     Mental Status: He is alert and oriented to person, place, and time.     Coordination: Coordination normal.  Psychiatric:        Behavior: Behavior normal. Behavior is cooperative.        Thought Content: Thought content normal.        Judgment: Judgment normal.          Patient has been counseled extensively about nutrition and exercise as well as the importance of adherence with medications and regular follow-up. The patient was given clear instructions to go to ER or return to medical center if symptoms don't improve, worsen or new problems develop. The patient verbalized understanding.   Follow-up: Return in about 3 months (around 01/01/2019).   Gildardo Pounds, FNP-BC [facility] and [place], [place]   10/01/2018, 5:23 PM

## 2018-10-10 ENCOUNTER — Ambulatory Visit: Payer: Medicaid Other

## 2018-10-28 MED FILL — ATENOLOL/CHLORTHAL 50/25: 50-25 | 30 days supply | Qty: 30 | Fill #1

## 2018-10-28 MED FILL — ?PRAVASTATIN NA 20MG TABL: 20 | 30 days supply | Qty: 30 | Fill #4

## 2018-10-28 MED FILL — valACYclovir HCL 1 GM TABS: 1 | 30 days supply | Qty: 30 | Fill #4

## 2018-10-30 ENCOUNTER — Other Ambulatory Visit: Payer: Self-pay

## 2018-10-30 ENCOUNTER — Ambulatory Visit: Payer: Self-pay | Attending: Family Medicine

## 2018-11-13 ENCOUNTER — Telehealth: Payer: Self-pay | Admitting: Nurse Practitioner

## 2018-11-13 NOTE — Telephone Encounter (Signed)
Pt was sent a letter from financial dept. Inform them, that the application they submitted was incomplete, since they were missing some documentation at the time of the appointment, Pt need to reschedule and resubmit all new papers and application for [employer] and [facility], P.S. old documents has been sent back to Pt and need to make a new appt °

## 2018-12-02 MED FILL — ATENOLOL/CHLORTHAL 50/25: 50-25 | 30 days supply | Qty: 30 | Fill #2

## 2018-12-02 MED FILL — ?PRAVASTATIN NA 20MG TABL: 20 | 30 days supply | Qty: 30 | Fill #5

## 2018-12-02 MED FILL — valACYclovir HCL 1 GM TABS: 1 | 30 days supply | Qty: 30 | Fill #5

## 2018-12-09 NOTE — Progress Notes (Signed)
Virtual Visit via Telephone Note  I connected with Corey Hale on 12/09/18 at 10:50 AM EDT by telephone and verified that I am speaking with the correct person using two identifiers.   I discussed the limitations, risks, security and privacy concerns of performing an evaluation and management service by telephone and the availability of in person appointments. I also discussed with the patient that there may be a patient responsible charge related to this service. The patient expressed understanding and agreed to proceed.  Patient location:   home My Location:  [place] office Persons on the call:  Me and the patient  History of Present Illness: Patient having sharp pain in L shoulder.  Occurring for about 1 month.  NKI.  Awakens him from sleep during the night with the pain.  No Cp/SOB/dizziness.  He hasn't taken anything for the pain bc it only hurts him at night.    Also wants to get UTD on blood work bc hasn't had any in almost 1 year.  BP OOO have been 120-130/80s   Observations/Objective:  NAD.  A&Ox3   Assessment and Plan: 1. Secondary hypertension Continue current regimen - Comprehensive metabolic panel; Future - CBC with Differential/Platelet; Future  2. Acute pain of left shoulder - cyclobenzaprine (FLEXERIL) 10 MG tablet; Take 1 tablet (10 mg total) by mouth at bedtime.  Dispense: 30 tablet; Refill: 0 - Ambulatory referral to Orthopedic Surgery  3. Hyperlipidemia, unspecified hyperlipidemia type - Lipid panel; Future    Follow Up Instructions: See PCP in 3-4 months   I discussed the assessment and treatment plan with the patient. The patient was provided an opportunity to ask questions and all were answered. The patient agreed with the plan and demonstrated an understanding of the instructions.   The patient was advised to call back or seek an in-person evaluation if the symptoms worsen or if the condition fails to improve as anticipated.  I provided 11 minutes of  non-face-to-face time during this encounter.   Freeman Caldron, PA-C  Patient ID: Corey Hale, male   DOB: 29-Sep-1965, 53 y.o.   MRN: [id number]

## 2018-12-10 ENCOUNTER — Ambulatory Visit: Payer: Self-pay | Attending: Nurse Practitioner | Admitting: Physician Assistant

## 2018-12-10 DIAGNOSIS — M25512 Pain in left shoulder: Secondary | ICD-10-CM

## 2018-12-10 DIAGNOSIS — E785 Hyperlipidemia, unspecified: Secondary | ICD-10-CM

## 2018-12-10 DIAGNOSIS — I159 Secondary hypertension, unspecified: Secondary | ICD-10-CM

## 2018-12-10 MED ORDER — CYCLOBENZAPRINE HCL 10 MG PO TABS: 10.0000 mg | ORAL_TABLET | Freq: Every day | ORAL | 0 refills | Status: DC

## 2018-12-10 MED FILL — CYCLOBENZAPRINE 10 MG TAB: 10 | 30 days supply | Qty: 30 | Fill #0

## 2018-12-11 ENCOUNTER — Other Ambulatory Visit: Payer: Self-pay

## 2018-12-11 ENCOUNTER — Ambulatory Visit: Payer: Self-pay | Attending: Nurse Practitioner

## 2018-12-11 DIAGNOSIS — I159 Secondary hypertension, unspecified: Secondary | ICD-10-CM

## 2018-12-11 DIAGNOSIS — E785 Hyperlipidemia, unspecified: Secondary | ICD-10-CM

## 2018-12-12 LAB — LIPID PANEL
Chol/HDL Ratio: 4.7 ratio (ref 0.0–5.0)
Cholesterol, Total: 187 mg/dL (ref 100–199)
HDL: 40 mg/dL (ref >39)
LDL Chol Calc (NIH): 108 mg/dL — ABNORMAL HIGH (ref 0–99)
Triglycerides: 223 mg/dL — ABNORMAL HIGH (ref 0–149)
VLDL Cholesterol Cal: 39 mg/dL (ref 5–40)

## 2018-12-12 LAB — CBC WITH DIFFERENTIAL/PLATELET
Basophils Absolute: 0.1 10*3/uL (ref 0.0–0.2)
Basos: 1 %
EOS (ABSOLUTE): 0.6 10*3/uL — ABNORMAL HIGH (ref 0.0–0.4)
Eos: 8 %
Hematocrit: 44.8 % (ref 37.5–51.0)
Hemoglobin: 14.7 g/dL (ref 13.0–17.7)
Immature Grans (Abs): 0 10*3/uL (ref 0.0–0.1)
Immature Granulocytes: 0 %
Lymphocytes Absolute: 2.7 10*3/uL (ref 0.7–3.1)
Lymphs: 35 %
MCH: 29.5 pg (ref 26.6–33.0)
MCHC: 32.8 g/dL (ref 31.5–35.7)
MCV: 90 fL (ref 79–97)
Monocytes Absolute: 0.5 10*3/uL (ref 0.1–0.9)
Monocytes: 7 %
Neutrophils Absolute: 3.9 10*3/uL (ref 1.4–7.0)
Neutrophils: 49 %
Platelets: 236 10*3/uL (ref 150–450)
RBC: 4.99 x10E6/uL (ref 4.14–5.80)
RDW: 12.9 % (ref 11.6–15.4)
WBC: 7.9 10*3/uL (ref 3.4–10.8)

## 2018-12-12 LAB — COMPREHENSIVE METABOLIC PANEL
ALT: 16 IU/L (ref 0–44)
AST: 16 IU/L (ref 0–40)
Albumin/Globulin Ratio: 1.6 (ref 1.2–2.2)
Albumin: 4.5 g/dL (ref 3.8–4.9)
Alkaline Phosphatase: 65 IU/L (ref 39–117)
BUN/Creatinine Ratio: 13 (ref 9–20)
BUN: 13 mg/dL (ref 6–24)
Bilirubin Total: 0.3 mg/dL (ref 0.0–1.2)
CO2: 29 mmol/L (ref 20–29)
Calcium: 9.7 mg/dL (ref 8.7–10.2)
Chloride: 99 mmol/L (ref 96–106)
Creatinine, Ser: 0.97 mg/dL (ref 0.76–1.27)
GFR calc Af Amer: 103 mL/min/{1.73_m2} (ref >59)
GFR calc non Af Amer: 89 mL/min/{1.73_m2} (ref >59)
Globulin, Total: 2.9 g/dL (ref 1.5–4.5)
Glucose: 87 mg/dL (ref 65–99)
Potassium: 3.4 mmol/L — ABNORMAL LOW (ref 3.5–5.2)
Sodium: 141 mmol/L (ref 134–144)
Total Protein: 7.4 g/dL (ref 6.0–8.5)

## 2019-01-01 ENCOUNTER — Other Ambulatory Visit: Payer: Self-pay | Admitting: Nurse Practitioner

## 2019-01-01 ENCOUNTER — Telehealth: Payer: Self-pay | Admitting: *Deleted

## 2019-01-01 DIAGNOSIS — I10 Essential (primary) hypertension: Secondary | ICD-10-CM

## 2019-01-01 DIAGNOSIS — J339 Nasal polyp, unspecified: Secondary | ICD-10-CM | POA: Insufficient documentation

## 2019-01-01 DIAGNOSIS — J343 Hypertrophy of nasal turbinates: Secondary | ICD-10-CM | POA: Insufficient documentation

## 2019-01-01 DIAGNOSIS — J342 Deviated nasal septum: Secondary | ICD-10-CM | POA: Insufficient documentation

## 2019-01-01 DIAGNOSIS — H903 Sensorineural hearing loss, bilateral: Secondary | ICD-10-CM | POA: Insufficient documentation

## 2019-01-01 MED ORDER — ATENOLOL-CHLORTHALIDONE 50-25 MG PO TABS: 1.0000 | ORAL_TABLET | Freq: Every day | ORAL | 0 refills | Status: DC

## 2019-01-01 MED FILL — FLUTICASONE PROP 50 MCG SPR: 50 | 16 days supply | Qty: 16 | Fill #0

## 2019-01-01 MED FILL — valACYclovir HCL 1 GM TABS: 1 | 30 days supply | Qty: 30 | Fill #6

## 2019-01-01 MED FILL — ?METHYLPREDNISOLONE 4 MG TA: 4 | 6 days supply | Qty: 21 | Fill #0

## 2019-01-01 MED FILL — ATENOLOL/CHLORTHAL 50/25: 50-25 | 30 days supply | Qty: 30 | Fill #0

## 2019-01-01 MED FILL — ?PRAVASTATIN NA 20MG TABL: 20 | 30 days supply | Qty: 30 | Fill #6

## 2019-01-01 NOTE — Telephone Encounter (Signed)
Patient was seen via tele visit on 12/10/2018 and advised to continue current HTN regimen, MA refilled BP medication for 90 days.

## 2019-01-02 ENCOUNTER — Encounter: Payer: Self-pay | Admitting: Nurse Practitioner

## 2019-01-02 ENCOUNTER — Other Ambulatory Visit: Payer: Self-pay

## 2019-01-02 ENCOUNTER — Ambulatory Visit: Payer: Self-pay | Attending: Nurse Practitioner | Admitting: Nurse Practitioner

## 2019-01-02 DIAGNOSIS — I10 Essential (primary) hypertension: Secondary | ICD-10-CM

## 2019-01-02 DIAGNOSIS — M25512 Pain in left shoulder: Secondary | ICD-10-CM

## 2019-01-02 DIAGNOSIS — E785 Hyperlipidemia, unspecified: Secondary | ICD-10-CM

## 2019-01-02 DIAGNOSIS — R0981 Nasal congestion: Secondary | ICD-10-CM

## 2019-01-02 MED ORDER — ATENOLOL-CHLORTHALIDONE 50-25 MG PO TABS: 1.0000 | ORAL_TABLET | Freq: Every day | ORAL | 0 refills | Status: DC

## 2019-01-02 MED ORDER — CYCLOBENZAPRINE HCL 10 MG PO TABS: 10.0000 mg | ORAL_TABLET | Freq: Every day | ORAL | 0 refills | Status: DC

## 2019-01-02 MED ORDER — PRAVASTATIN SODIUM 20 MG PO TABS: 20.0000 mg | ORAL_TABLET | Freq: Every day | ORAL | 2 refills | Status: DC

## 2019-01-02 MED ORDER — FLUTICASONE PROPIONATE 50 MCG/ACT NA SUSP: 2.0000 | Freq: Every day | NASAL | 6 refills | Status: DC

## 2019-01-02 MED FILL — CYCLOBENZAPRINE 10 MG TAB: 10 | 30 days supply | Qty: 30 | Fill #0

## 2019-01-02 NOTE — Progress Notes (Signed)
Virtual Visit via Telephone Note Due to national recommendations of social distancing due to [place] 19, telehealth visit is felt to be most appropriate for this patient at this time.  I discussed the limitations, risks, security and privacy concerns of performing an evaluation and management service by telephone and the availability of in person appointments. I also discussed with the patient that there may be a patient responsible charge related to this service. The patient expressed understanding and agreed to proceed.    I connected with SAFAREE BERTE on 01/02/19  at   3:10 PM EDT  EDT by telephone and verified that I am speaking with the correct person using two identifiers.   Consent I discussed the limitations, risks, security and privacy concerns of performing an evaluation and management service by telephone and the availability of in person appointments. I also discussed with the patient that there may be a patient responsible charge related to this service. The patient expressed understanding and agreed to proceed.   Location of Patient: Private  Residence   Location of Provider: [place] [facility] [employer] [facility]    Persons participating in Telemedicine visit: Geryl Rankins FNP-BC [place]    History of Present Illness: Telemedicine visit for: Essential Hypertension  has a past medical history of Chronic back pain, Erectile dysfunction, Goiter, Herpes simplex, Hypercholesteremia, Hypertension, Perirectal abscess, Skin abscess, Thyroid disease, and Tobacco dependence.   Essential Hypertension Monitoring blood pressure at home. Well controlled. He is diet (DASH) and exercise compliant. Most recent BP reading yesterday 124/84. Denies chest pain, shortness of breath, palpitations, lightheadedness, dizziness, headaches or BLE edema.  He endorses medication compliance taking Tenoretic 50-25 mg daily. BP Readings from Last 3 Encounters:  10/01/18 125/82   12/27/17 123/85  10/29/17 (!) 128/91    Hyperlipidemia Patient presents for follow up to hyperlipidemia.  He is medication compliant taking pravastatin 20 mg daily. He is not consistently diet compliant and denies  statin intolerance including myalgias.  Lab Results  Component Value Date   CHOL 187 12/11/2018   Lab Results  Component Value Date   HDL 40 12/11/2018   Lab Results  Component Value Date   LDLCALC 108 (H) 12/11/2018   Lab Results  Component Value Date   TRIG 223 (H) 12/11/2018   Lab Results  Component Value Date   CHOLHDL 4.7 12/11/2018    Past Medical History:  Diagnosis Date  . Chronic back pain   . Erectile dysfunction   . Goiter   . Herpes simplex   . Hypercholesteremia   . Hypertension   . Perirectal abscess   . Skin abscess   . Thyroid disease   . Tobacco dependence     Past Surgical History:  Procedure Laterality Date  . ARM HARDWARE REMOVAL Right   . INCISION AND DRAINAGE PERIRECTAL ABSCESS N/A 07/22/2017   Procedure: IRRIGATION, DEBRIDEMENT AND DRAINAGE PERIRECTAL ABSCESS;  Surgeon: [name], MD;  Location: [facility];  Service: General;  Laterality: N/A;  . NECK SURGERY    . PAROTIDECTOMY     Dr Redmond Baseman  . THYROIDECTOMY, PARTIAL  2008   Dr Marlou Starks    Family History  Problem Relation Age of Onset  . Stroke Mother   . Stroke Father     Social History   Socioeconomic History  . Marital status: Married    Spouse name: Not on file  . Number of children: Not on file  . Years of education: Not on file  .  Highest education level: Not on file  Occupational History  . Not on file  Social Needs  . Financial resource strain: Not on file  . Food insecurity    Worry: Not on file    Inability: Not on file  . Transportation needs    Medical: Not on file    Non-medical: Not on file  Tobacco Use  . Smoking status: Current Every Day Smoker    Packs/day: 0.50    Types: Cigarettes  . Smokeless tobacco: Never Used  Substance and Sexual Activity   . Alcohol use: No    Alcohol/week: 0.0 standard drinks    Frequency: Never  . Drug use: No  . Sexual activity: Yes  Lifestyle  . Physical activity    Days per week: Not on file    Minutes per session: Not on file  . Stress: Not on file  Relationships  . Social Herbalist on phone: Not on file    Gets together: Not on file    Attends religious service: Not on file    Active member of club or organization: Not on file    Attends meetings of clubs or organizations: Not on file    Relationship status: Not on file  Other Topics Concern  . Not on file  Social History Narrative   ** Merged History Encounter **         Observations/Objective: Awake, alert and oriented x 3   Review of Systems  Constitutional: Negative for fever, malaise/fatigue and weight loss.  HENT: Negative.  Negative for nosebleeds.   Eyes: Negative.  Negative for blurred vision, double vision and photophobia.  Respiratory: Negative.  Negative for cough and shortness of breath.   Cardiovascular: Negative.  Negative for chest pain, palpitations and leg swelling.  Gastrointestinal: Negative.  Negative for heartburn, nausea and vomiting.  Musculoskeletal: Negative.  Negative for myalgias.  Neurological: Negative.  Negative for dizziness, focal weakness, seizures and headaches.  Psychiatric/Behavioral: Negative.  Negative for suicidal ideas.    Assessment and Plan: Corey Hale was seen today for follow-up.  Diagnoses and all orders for this visit:  Essential hypertension -     atenolol-chlorthalidone (TENORETIC) 50-25 MG tablet; Take 1 tablet by mouth daily. Continue all antihypertensives as prescribed.  Remember to bring in your blood pressure log with you for your follow up appointment.  DASH/Mediterranean Diets are healthier choices for HTN.   Dyslipidemia -     pravastatin (PRAVACHOL) 20 MG tablet; Take 1 tablet (20 mg total) by mouth daily. INSTRUCTIONS: Work on a low fat, heart healthy diet  and participate in regular aerobic exercise program by working out at least 150 minutes per week; 5 days a week-30 minutes per day. Avoid red meat, fried foods. junk foods, sodas, sugary drinks, unhealthy snacking, alcohol and smoking.  Drink at least 48oz of water per day and monitor your carbohydrate intake daily.   Acute pain of left shoulder -     cyclobenzaprine (FLEXERIL) 10 MG tablet; Take 1 tablet (10 mg total) by mouth at bedtime. Work on losing weight to help reduce joint pain. May alternate with heat and ice application for pain relief. May also alternate with acetaminophen and Ibuprofen as prescribed pain relief. Other alternatives include massage, acupuncture and water aerobics.  You must stay active and avoid a sedentary lifestyle.  Nasal congestion -     fluticasone (FLONASE) 50 MCG/ACT nasal spray; Place 2 sprays into both nostrils daily.     Follow Up  Instructions Return in about 3 months (around 04/04/2019) for Fasting labs.     I discussed the assessment and treatment plan with the patient. The patient was provided an opportunity to ask questions and all were answered. The patient agreed with the plan and demonstrated an understanding of the instructions.   The patient was advised to call back or seek an in-person evaluation if the symptoms worsen or if the condition fails to improve as anticipated.  I provided 16 minutes of non-face-to-face time during this encounter including median intraservice time, reviewing previous notes, labs, imaging, medications and explaining diagnosis and management.  Gildardo Pounds, FNP-BC

## 2019-02-04 MED FILL — ATENOLOL/CHLORTHAL 50/25: 50-25 | 30 days supply | Qty: 30 | Fill #0

## 2019-02-04 MED FILL — valACYclovir HCL 1 GM TABS: 1 | 30 days supply | Qty: 30 | Fill #7

## 2019-02-04 MED FILL — PRAVASTATIN SODIUM 20 MG TA: 20 | 30 days supply | Qty: 30 | Fill #0

## 2019-03-06 MED FILL — FLUTICASONE PROP 50 MCG SPR: 50 | 16 days supply | Qty: 16 | Fill #1

## 2019-03-06 MED FILL — ATENOLOL/CHLORTHAL 50/25: 50-25 | 30 days supply | Qty: 30 | Fill #1

## 2019-03-06 MED FILL — PRAVASTATIN SODIUM 20 MG TA: 20 | 30 days supply | Qty: 30 | Fill #1

## 2019-03-06 MED FILL — valACYclovir HCL 1 GM TABS: 1 | 30 days supply | Qty: 30 | Fill #8

## 2019-04-06 ENCOUNTER — Other Ambulatory Visit: Payer: Self-pay

## 2019-04-06 ENCOUNTER — Encounter: Payer: Self-pay | Admitting: Nurse Practitioner

## 2019-04-06 ENCOUNTER — Ambulatory Visit: Payer: Self-pay | Attending: Nurse Practitioner | Admitting: Nurse Practitioner

## 2019-04-06 VITALS — BP 120/85 | HR 79 | Temp 97.7°F | Ht 72.0 in | Wt 201.0 lb

## 2019-04-06 DIAGNOSIS — I10 Essential (primary) hypertension: Secondary | ICD-10-CM

## 2019-04-06 DIAGNOSIS — G8929 Other chronic pain: Secondary | ICD-10-CM

## 2019-04-06 DIAGNOSIS — F17200 Nicotine dependence, unspecified, uncomplicated: Secondary | ICD-10-CM

## 2019-04-06 DIAGNOSIS — R7303 Prediabetes: Secondary | ICD-10-CM

## 2019-04-06 DIAGNOSIS — Z8639 Personal history of other endocrine, nutritional and metabolic disease: Secondary | ICD-10-CM

## 2019-04-06 DIAGNOSIS — Z1211 Encounter for screening for malignant neoplasm of colon: Secondary | ICD-10-CM

## 2019-04-06 DIAGNOSIS — E785 Hyperlipidemia, unspecified: Secondary | ICD-10-CM

## 2019-04-06 DIAGNOSIS — M545 Low back pain, unspecified: Secondary | ICD-10-CM

## 2019-04-06 DIAGNOSIS — H9313 Tinnitus, bilateral: Secondary | ICD-10-CM

## 2019-04-06 LAB — GLUCOSE, POCT (MANUAL RESULT ENTRY): POC Glucose: 169 mg/dl — AB (ref 70–99)

## 2019-04-06 LAB — POCT GLYCOSYLATED HEMOGLOBIN (HGB A1C): Hemoglobin A1C: 5.5 % (ref 4.0–5.6)

## 2019-04-06 MED ORDER — CYCLOBENZAPRINE HCL 10 MG PO TABS: 10.0000 mg | ORAL_TABLET | Freq: Every day | ORAL | 1 refills | Status: DC

## 2019-04-06 MED ORDER — PRAVASTATIN SODIUM 20 MG PO TABS: 20.0000 mg | ORAL_TABLET | Freq: Every day | ORAL | 2 refills | Status: DC

## 2019-04-06 MED ORDER — ATENOLOL-CHLORTHALIDONE 50-25 MG PO TABS: 1.0000 | ORAL_TABLET | Freq: Every day | ORAL | 0 refills | Status: DC

## 2019-04-06 MED FILL — CYCLOBENZAPRINE 10 MG TAB: 10 | 30 days supply | Qty: 30 | Fill #0

## 2019-04-06 MED FILL — valACYclovir HCL 1 GM TABS: 1 | 30 days supply | Qty: 30 | Fill #9

## 2019-04-06 MED FILL — ATENOLOL/CHLORTHAL 50/25: 50-25 | 30 days supply | Qty: 30 | Fill #2

## 2019-04-06 MED FILL — PRAVASTATIN SODIUM 20 MG TA: 20 | 30 days supply | Qty: 30 | Fill #2

## 2019-04-06 NOTE — Progress Notes (Signed)
Assessment & Plan:  Corey Hale was seen today for follow-up.  Diagnoses and all orders for this visit:  Essential hypertension -     Basic metabolic panel -     atenolol-chlorthalidone (TENORETIC) 50-25 MG tablet; Take 1 tablet by mouth daily. Please fill as a 90 day supply Continue all antihypertensives as prescribed.  Remember to bring in your blood pressure log with you for your follow up appointment.  DASH/Mediterranean Diets are healthier choices for HTN.    Prediabetes -     Glucose (CBG) -     HgB A1c Continue blood sugar control as discussed in office today, low carbohydrate diet, and regular physical exercise as tolerated, 150 minutes per week (30 min each day, 5 days per week, or 50 min 3 days per week).  Tobacco dependence Corey Hale was counseled on the dangers of tobacco use, and was advised to quit. Reviewed strategies to maximize success, including removing cigarettes and smoking materials from environment, stress management and support of family/friends as well as pharmacological alternatives including: Wellbutrin, Chantix, Nicotine patch, Nicotine gum or lozenges. Smoking cessation support: smoking cessation hotline: 1-800-QUIT-NOW.  Smoking cessation classes are also available through [facility]. Call [phone] or visit our website at [email or web address]/.   A total of 3 minutes was spent on counseling for smoking cessation and Corey Hale is not ready to quit.   Tinnitus of both ears Needs ENT evaluation.  Patient states Corey Hale was told Corey Hale needed hearing aids many years ago and at that time the hearing aids were estimated the cost of $3000 and Corey Hale could not afford them at that time.  Corey Hale declines ENT referral today.   Chronic bilateral low back pain without sciatica -     cyclobenzaprine (FLEXERIL) 10 MG tablet; Take 1 tablet (10 mg total) by mouth at bedtime. Work on losing weight to help reduce back pain. May alternate with heat and ice application for  pain relief. May also alternate with acetaminophen and Ibuprofen as prescribed for back pain. Other alternatives include massage, acupuncture and water aerobics.  You must stay active and avoid a sedentary lifestyle.    Dyslipidemia -     pravastatin (PRAVACHOL) 20 MG tablet; Take 1 tablet (20 mg total) by mouth daily. Please fill as a 90 day supply INSTRUCTIONS: Work on a low fat, heart healthy diet and participate in regular aerobic exercise program by working out at least 150 minutes per week; 5 days a week-30 minutes per day. Avoid red meat/beef/steak,  fried foods. junk foods, sodas, sugary drinks, unhealthy snacking, alcohol and smoking.  Drink at least 80 oz of water per day and monitor your carbohydrate intake daily.  Lab Results  Component Value Date   LDLCALC 108 (H) 12/11/2018    H/O thyroid nodule -     TSH  Colon cancer screening -     Fecal occult blood, imunochemical([facility])    Patient has been counseled on age-appropriate routine health concerns for screening and prevention. These are reviewed and up-to-date. Referrals have been placed accordingly. Immunizations are up-to-date or declined.    Subjective:   Chief Complaint  Patient presents with  . Follow-up    Pt. is requesting medication refill. Pt. is having ear pain on both side with ringing sound.    HPI Corey Hale 54 y.o. male presents to office today for f/u.   has a past medical history of Chronic back pain, Erectile dysfunction, Goiter, Herpes simplex, Hypercholesteremia, Hypertension, Perirectal abscess, Skin  abscess, Thyroid disease, and Tobacco dependence.   Prediabetes Well-controlled with diet only. Lab Results  Component Value Date   HGBA1C 5.5 04/06/2019   Essential Hypertension Blood pressure is well controlled.  She is taking atenolol-chlorthalidone 50-25 mg daily as prescribed.  Corey Hale does not monitor his blood pressure at home as Corey Hale does not have a blood pressure device. Denies chest  pain, shortness of breath, palpitations, lightheadedness, dizziness, headaches or BLE edema.  BP Readings from Last 3 Encounters:  04/06/19 120/85  10/01/18 125/82  12/27/17 123/85   Tinnitus: Patient presents with tinnitus. Onset of symptoms was 10 years ago  with unchanged course since that time. Patient describes the tinnitus as fluctuating located in the bilateral ear. The quality is described as variable pitch that sounds like hissing, roaring and seashore/waves. The pattern is nonpulsatile with an intensity that is medium. Patient describes his level of annoyance as minimally annoying, always aware. Associated symptoms include hearing loss Family history is negative family history for tinnitus Patient has had a prior evaluation for tinnitus by ENT. Patient does not have hearing aids at this time. Previous treatments include none.   Review of Systems  Constitutional: Negative for fever, malaise/fatigue and weight loss.  HENT: Positive for hearing loss and tinnitus. Negative for nosebleeds.   Eyes: Negative.  Negative for blurred vision, double vision and photophobia.  Respiratory: Negative.  Negative for cough and shortness of breath.   Cardiovascular: Negative.  Negative for chest pain, palpitations and leg swelling.  Gastrointestinal: Negative.  Negative for heartburn, nausea and vomiting.  Genitourinary:       ED  Musculoskeletal: Positive for back pain. Negative for myalgias.  Neurological: Negative.  Negative for dizziness, focal weakness, seizures and headaches.  Psychiatric/Behavioral: Negative.  Negative for suicidal ideas.    Past Medical History:  Diagnosis Date  . Chronic back pain   . Erectile dysfunction   . Goiter   . Herpes simplex   . Hypercholesteremia   . Hypertension   . Perirectal abscess   . Skin abscess   . Thyroid disease   . Tobacco dependence     Past Surgical History:  Procedure Laterality Date  . ARM HARDWARE REMOVAL Right   . INCISION AND  DRAINAGE PERIRECTAL ABSCESS N/A 07/22/2017   Procedure: IRRIGATION, DEBRIDEMENT AND DRAINAGE PERIRECTAL ABSCESS;  Surgeon: [name], MD;  Location: [place];  Service: General;  Laterality: N/A;  . NECK SURGERY    . PAROTIDECTOMY     Dr Redmond Baseman  . THYROIDECTOMY, PARTIAL  2008   Dr Marlou Starks    Family History  Problem Relation Age of Onset  . Stroke Mother   . Stroke Father     Social History Reviewed with no changes to be made today.   Outpatient Medications Prior to Visit  Medication Sig Dispense Refill  . aspirin 81 MG tablet Take 1 tablet (81 mg total) by mouth daily. 90 tablet 3  . fluticasone (FLONASE) 50 MCG/ACT nasal spray Place 2 sprays into both nostrils daily. 16 g 6  . cyclobenzaprine (FLEXERIL) 10 MG tablet Take 1 tablet (10 mg total) by mouth at bedtime. 30 tablet 0  . atenolol-chlorthalidone (TENORETIC) 50-25 MG tablet Take 1 tablet by mouth daily. 90 tablet 0  . pravastatin (PRAVACHOL) 20 MG tablet Take 1 tablet (20 mg total) by mouth daily. 90 tablet 2   No facility-administered medications prior to visit.    No Known Allergies     Objective:    BP 120/85 (BP  Location: Right Arm, Patient Position: Sitting, Cuff Size: Normal)   Pulse 79   Temp 97.7 F (36.5 C) (Oral)   Ht 6' (1.829 m)   Wt 201 lb (91.2 kg)   SpO2 97%   BMI 27.26 kg/m  Wt Readings from Last 3 Encounters:  04/06/19 201 lb (91.2 kg)  10/01/18 196 lb (88.9 kg)  12/27/17 196 lb 3.2 oz (89 kg)    Physical Exam Vitals and nursing note reviewed.  Constitutional:      Appearance: Corey Hale is well-developed.  HENT:     Head: Normocephalic and atraumatic.     Right Ear: Tympanic membrane, ear canal and external ear normal. Decreased hearing noted. No middle ear effusion. There is no impacted cerumen.     Left Ear: Tympanic membrane, ear canal and external ear normal. Decreased hearing noted. There is impacted cerumen.  Cardiovascular:     Rate and Rhythm: Normal rate and regular rhythm.     Heart  sounds: Normal heart sounds. No murmur. No friction rub. No gallop.   Pulmonary:     Effort: Pulmonary effort is normal. No tachypnea or respiratory distress.     Breath sounds: Normal breath sounds. No decreased breath sounds, wheezing, rhonchi or rales.  Chest:     Chest wall: No tenderness.  Abdominal:     General: Bowel sounds are normal.     Palpations: Abdomen is soft.  Musculoskeletal:        General: Normal range of motion.     Cervical back: Normal range of motion.  Skin:    General: Skin is warm and dry.  Neurological:     Mental Status: Corey Hale is alert and oriented to person, place, and time.     Coordination: Coordination normal.  Psychiatric:        Behavior: Behavior normal. Behavior is cooperative.        Thought Content: Thought content normal.        Judgment: Judgment normal.          Patient has been counseled extensively about nutrition and exercise as well as the importance of adherence with medications and regular follow-up. The patient was given clear instructions to go to ER or return to medical center if symptoms don't improve, worsen or new problems develop. The patient verbalized understanding.   Follow-up: Return in about 3 months (around 07/05/2019).   Gildardo Pounds, FNP-BC [facility] and [place], [place]   04/06/2019, 3:46 PM

## 2019-04-07 LAB — BASIC METABOLIC PANEL
BUN/Creatinine Ratio: 13 (ref 9–20)
BUN: 11 mg/dL (ref 6–24)
CO2: 28 mmol/L (ref 20–29)
Calcium: 9.6 mg/dL (ref 8.7–10.2)
Chloride: 96 mmol/L (ref 96–106)
Creatinine, Ser: 0.88 mg/dL (ref 0.76–1.27)
GFR calc Af Amer: 113 mL/min/{1.73_m2} (ref >59)
GFR calc non Af Amer: 98 mL/min/{1.73_m2} (ref >59)
Glucose: 85 mg/dL (ref 65–99)
Potassium: 3.5 mmol/L (ref 3.5–5.2)
Sodium: 140 mmol/L (ref 134–144)

## 2019-04-07 LAB — TSH: TSH: 1.39 u[IU]/mL (ref 0.450–4.500)

## 2019-04-10 ENCOUNTER — Other Ambulatory Visit: Payer: Self-pay

## 2019-04-10 ENCOUNTER — Emergency Department (HOSPITAL_COMMUNITY)
Admission: EM | Admit: 2019-04-10 | Discharge: 2019-04-10 | Disposition: A | Discharge status: Home / Self Care | Payer: Medicaid Other | Attending: Emergency Medicine | Admitting: Emergency Medicine

## 2019-04-10 DIAGNOSIS — Z5321 Procedure and treatment not carried out due to patient leaving prior to being seen by health care provider: Secondary | ICD-10-CM | POA: Insufficient documentation

## 2019-04-10 DIAGNOSIS — L0231 Cutaneous abscess of buttock: Secondary | ICD-10-CM | POA: Insufficient documentation

## 2019-04-10 NOTE — ED Triage Notes (Signed)
Pt here with c/o abscess on his buttocks , his tory of same , states that he usually has it cut and drained

## 2019-04-10 NOTE — ED Notes (Signed)
Pt. Stated he needed to leave because he is feeling okay

## 2019-04-30 MED FILL — CYCLOBENZAPRINE 10 MG TAB: 10 | 30 days supply | Qty: 30 | Fill #1

## 2019-05-12 MED FILL — PRAVASTATIN SODIUM 20 MG TA: 20 | 30 days supply | Qty: 30 | Fill #0

## 2019-05-12 MED FILL — ATENOLOL/CHLORTHAL 50/25: 50-25 | 30 days supply | Qty: 30 | Fill #0

## 2019-05-20 ENCOUNTER — Ambulatory Visit: Payer: Medicaid Other | Attending: Nurse Practitioner

## 2019-05-20 ENCOUNTER — Other Ambulatory Visit: Payer: Self-pay

## 2019-06-08 MED FILL — ATENOLOL/CHLORTHAL 50/25: 50-25 | 30 days supply | Qty: 30 | Fill #1

## 2019-06-08 MED FILL — PRAVASTATIN SODIUM 20 MG TA: 20 | 30 days supply | Qty: 30 | Fill #1

## 2019-06-10 MED FILL — valACYclovir HCL 1 GM TABS: 1 | 30 days supply | Qty: 30 | Fill #10

## 2019-07-09 ENCOUNTER — Other Ambulatory Visit: Payer: Self-pay | Admitting: Nurse Practitioner

## 2019-07-09 DIAGNOSIS — A6000 Herpesviral infection of urogenital system, unspecified: Secondary | ICD-10-CM

## 2019-07-09 MED FILL — ?PRAVASTATIN NA 20MG TABL: 20 | 30 days supply | Qty: 30 | Fill #2

## 2019-07-09 MED FILL — ATENOLOL-CHLORTHALIDONE 50-: 50-25 | 30 days supply | Qty: 30 | Fill #2

## 2019-07-14 MED FILL — valACYclovir HCL 1 GM TABS: 1 | 30 days supply | Qty: 30 | Fill #0

## 2019-07-24 DIAGNOSIS — H9313 Tinnitus, bilateral: Secondary | ICD-10-CM | POA: Insufficient documentation

## 2019-07-24 MED FILL — valACYclovir HCL 1 GM TABS: 1 | 30 days supply | Qty: 30 | Fill #0

## 2019-08-12 MED FILL — ?PRAVASTATIN NA 20MG TABL: 20 | 90 days supply | Qty: 90 | Fill #3

## 2019-08-12 MED FILL — ATENOLOL/CHLORTHAL 50/25: 50-25 | 30 days supply | Qty: 30 | Fill #0

## 2019-08-12 MED FILL — valACYclovir HCL 1 GM TABS: 1 | 90 days supply | Qty: 90 | Fill #0

## 2019-08-13 IMAGING — DX DG KNEE 1-2V*L*
2 series · 2 of 2 positions shown · non-contrast
Comparison: 04/17/2017

CLINICAL DATA: Pt. States swelling around total left knee x one
month; states pain anteriorly and posteriorly

EXAM:
LEFT KNEE - 1-2 VIEW

[knee ap]
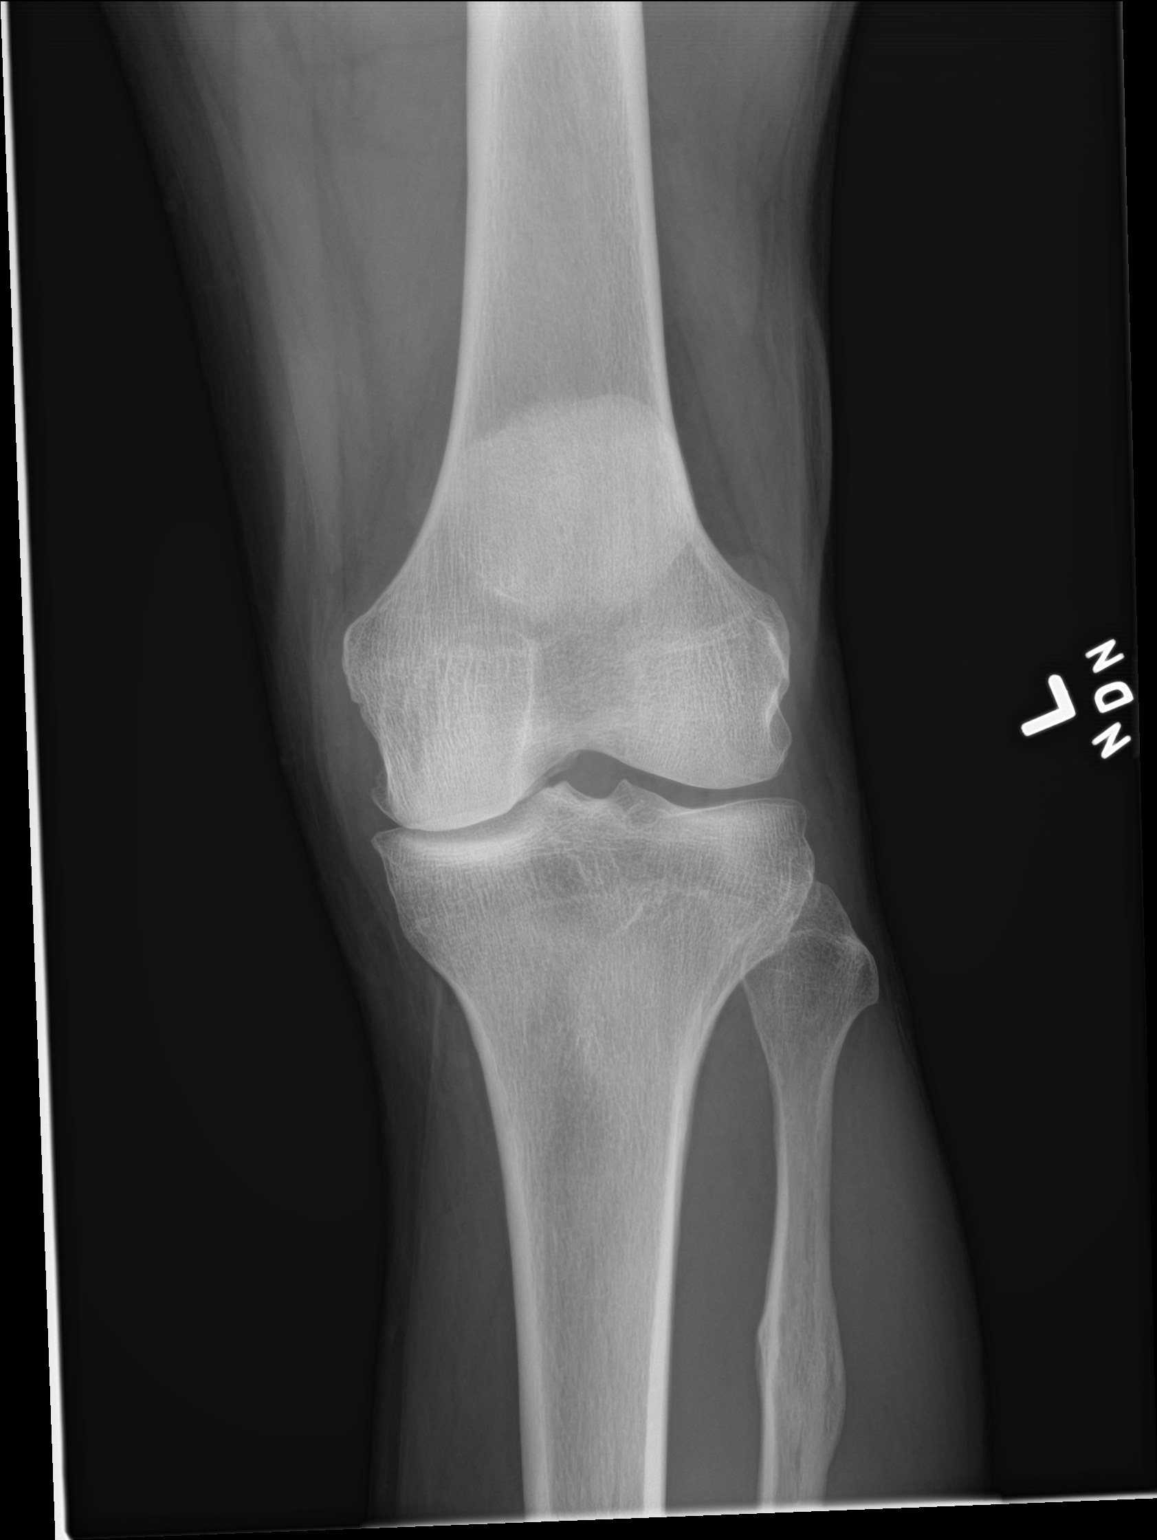

[knee lat]
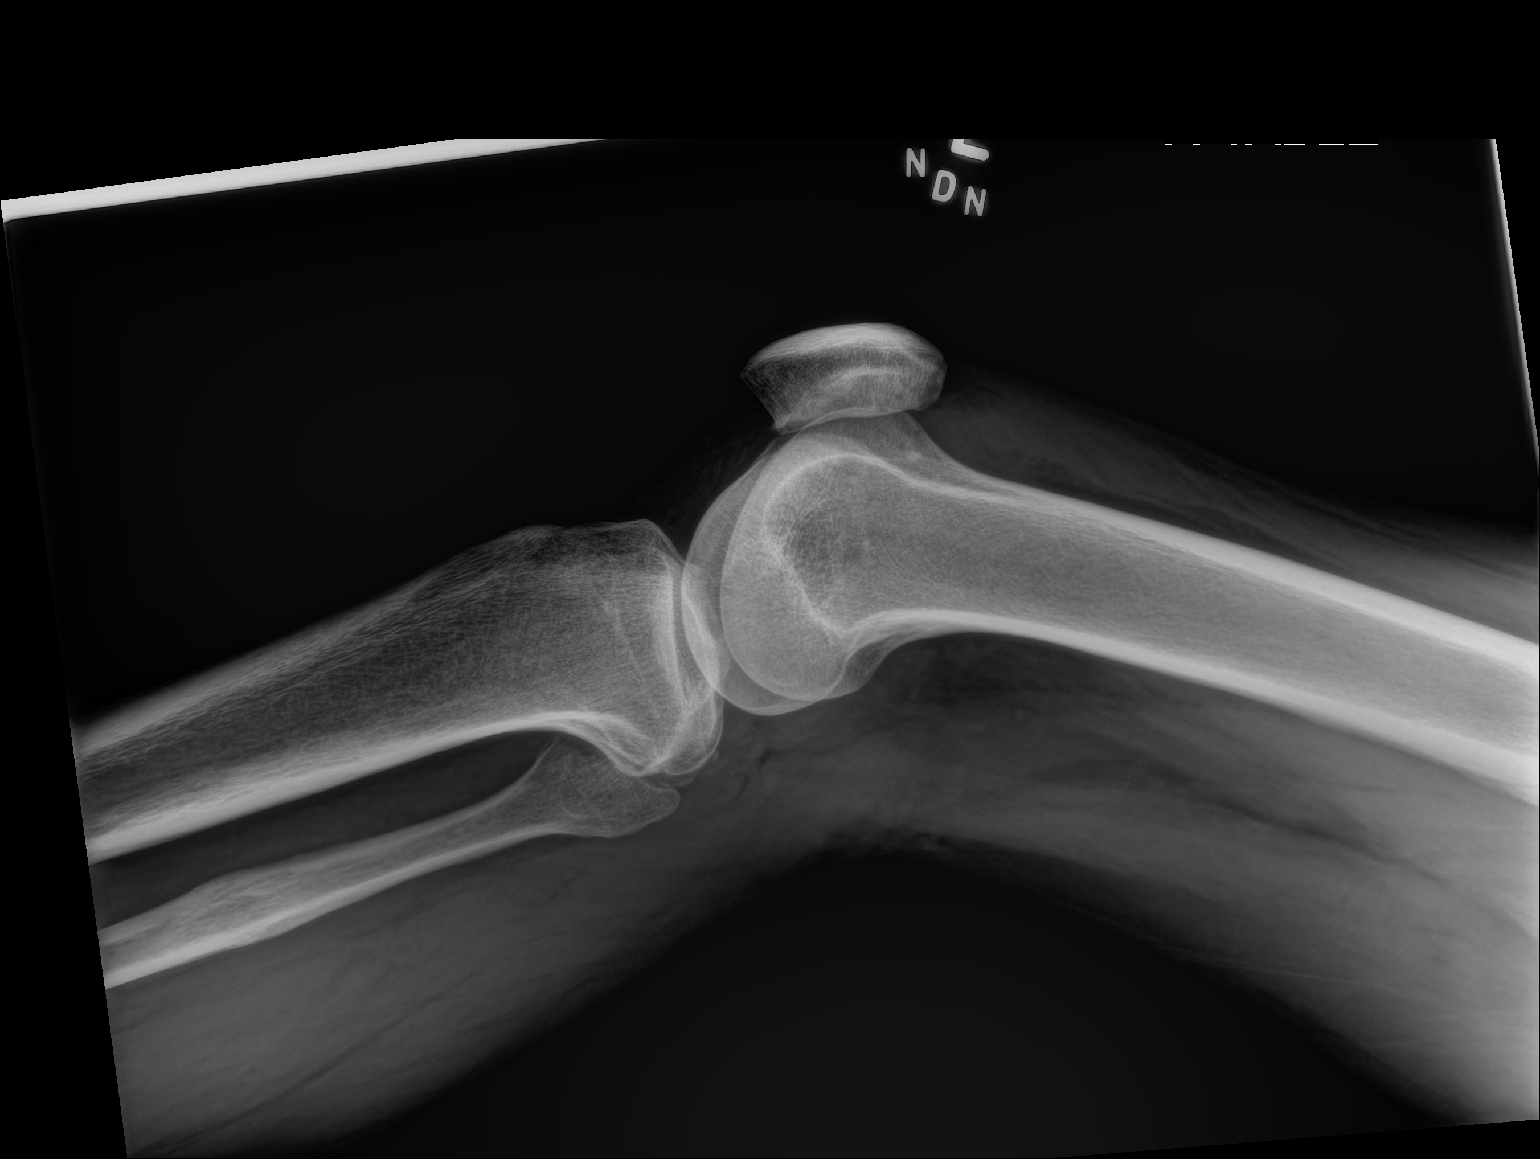

[2 of 2 positions shown; findings below may reference images not displayed]

FINDINGS: No acute fracture. Old healed fracture of the proximal fibular
shaft, stable.

Mild narrowing of the medial joint space compartment with small
associated marginal osteophytes. Remaining joint space compartments
are well preserved. No other arthropathic change.

No convincing joint effusion.  Soft tissues are unremarkable.
IMPRESSION: 1. No fracture or acute finding.
2. Mild osteoarthritis involving the medial joint space compartment.
3. No convincing joint effusion.

## 2019-08-21 ENCOUNTER — Encounter (HOSPITAL_COMMUNITY): Payer: Self-pay | Admitting: Emergency Medicine

## 2019-08-21 ENCOUNTER — Other Ambulatory Visit (HOSPITAL_COMMUNITY): Payer: Self-pay | Admitting: Radiology

## 2019-08-21 ENCOUNTER — Emergency Department (HOSPITAL_COMMUNITY)
Admission: EM | Admit: 2019-08-21 | Discharge: 2019-08-22 | Discharge status: Against Medical Advice | Payer: Medicaid Other | Attending: Emergency Medicine | Admitting: Emergency Medicine

## 2019-08-21 ENCOUNTER — Emergency Department (HOSPITAL_COMMUNITY): Payer: Medicaid Other

## 2019-08-21 ENCOUNTER — Other Ambulatory Visit: Payer: Self-pay

## 2019-08-21 DIAGNOSIS — Z5321 Procedure and treatment not carried out due to patient leaving prior to being seen by health care provider: Secondary | ICD-10-CM | POA: Insufficient documentation

## 2019-08-21 DIAGNOSIS — R0789 Other chest pain: Secondary | ICD-10-CM | POA: Insufficient documentation

## 2019-08-21 LAB — BASIC METABOLIC PANEL
Anion gap: 10 (ref 5–15)
BUN: 12 mg/dL (ref 6–20)
CO2: 29 mmol/L (ref 22–32)
Calcium: 9.1 mg/dL (ref 8.9–10.3)
Chloride: 101 mmol/L (ref 98–111)
Creatinine, Ser: 0.89 mg/dL (ref 0.61–1.24)
GFR calc Af Amer: >60 mL/min (ref >60)
GFR calc non Af Amer: >60 mL/min (ref >60)
Glucose, Bld: 100 mg/dL — ABNORMAL HIGH (ref 70–99)
Potassium: 3.2 mmol/L — ABNORMAL LOW (ref 3.5–5.1)
Sodium: 140 mmol/L (ref 135–145)

## 2019-08-21 LAB — CBC
HCT: 44.4 % (ref 39.0–52.0)
Hemoglobin: 14.2 g/dL (ref 13.0–17.0)
MCH: 29.3 pg (ref 26.0–34.0)
MCHC: 32 g/dL (ref 30.0–36.0)
MCV: 91.5 fL (ref 80.0–100.0)
Platelets: 242 10*3/uL (ref 150–400)
RBC: 4.85 MIL/uL (ref 4.22–5.81)
RDW: 13.3 % (ref 11.5–15.5)
WBC: 8.6 10*3/uL (ref 4.0–10.5)
nRBC: 0 % (ref 0.0–0.2)

## 2019-08-21 LAB — TROPONIN I (HIGH SENSITIVITY): Troponin I (High Sensitivity): 4 ng/L (ref <18)

## 2019-08-21 MED ORDER — SODIUM CHLORIDE 0.9% FLUSH: 3.0000 mL | Freq: Once | INTRAVENOUS | Status: DC

## 2019-08-21 NOTE — ED Triage Notes (Signed)
Patient arrives to ED with complaints of left sided chest pain that radiates to his left arm that started today. Patient arrives to ED in 10/10 pain and is diaphoretic and short of breath.

## 2019-08-25 ENCOUNTER — Other Ambulatory Visit: Payer: Self-pay | Admitting: Otolaryngology

## 2019-09-07 MED FILL — ATENOLOL-CHLORTHALIDONE 50-: 50-25 | 30 days supply | Qty: 30 | Fill #1

## 2019-09-16 ENCOUNTER — Other Ambulatory Visit: Payer: Self-pay

## 2019-09-16 ENCOUNTER — Ambulatory Visit: Payer: Self-pay | Attending: Physician Assistant | Admitting: Physician Assistant

## 2019-09-16 ENCOUNTER — Other Ambulatory Visit: Payer: Self-pay | Admitting: Physician Assistant

## 2019-09-16 ENCOUNTER — Encounter: Payer: Self-pay | Admitting: Physician Assistant

## 2019-09-16 VITALS — BP 127/86 | HR 77 | Temp 98.1°F | Resp 18 | Ht 72.0 in | Wt 204.0 lb

## 2019-09-16 DIAGNOSIS — Z09 Encounter for follow-up examination after completed treatment for conditions other than malignant neoplasm: Secondary | ICD-10-CM

## 2019-09-16 DIAGNOSIS — E876 Hypokalemia: Secondary | ICD-10-CM | POA: Insufficient documentation

## 2019-09-16 DIAGNOSIS — E785 Hyperlipidemia, unspecified: Secondary | ICD-10-CM | POA: Insufficient documentation

## 2019-09-16 DIAGNOSIS — E079 Disorder of thyroid, unspecified: Secondary | ICD-10-CM | POA: Insufficient documentation

## 2019-09-16 DIAGNOSIS — R0981 Nasal congestion: Secondary | ICD-10-CM | POA: Insufficient documentation

## 2019-09-16 DIAGNOSIS — Z79899 Other long term (current) drug therapy: Secondary | ICD-10-CM | POA: Insufficient documentation

## 2019-09-16 DIAGNOSIS — Z7982 Long term (current) use of aspirin: Secondary | ICD-10-CM | POA: Insufficient documentation

## 2019-09-16 DIAGNOSIS — I10 Essential (primary) hypertension: Secondary | ICD-10-CM | POA: Insufficient documentation

## 2019-09-16 MED ORDER — PRAVASTATIN SODIUM 20 MG PO TABS: 20.0000 mg | ORAL_TABLET | Freq: Every day | ORAL | 2 refills | Status: DC

## 2019-09-16 MED ORDER — FLUTICASONE PROPIONATE 50 MCG/ACT NA SUSP: 2.0000 | Freq: Every day | NASAL | 6 refills | Status: DC

## 2019-09-16 MED ORDER — ATENOLOL-CHLORTHALIDONE 50-25 MG PO TABS: 1.0000 | ORAL_TABLET | Freq: Every day | ORAL | 1 refills | Status: DC

## 2019-09-16 MED FILL — FLUTICASONE PROP 50 MCG SPR: 50 | 30 days supply | Qty: 16 | Fill #0

## 2019-09-16 NOTE — Patient Instructions (Signed)

## 2019-09-16 NOTE — Progress Notes (Signed)
Patient ID: Corey Hale, male   DOB: 06-13-1965, 54 y.o.   MRN: [id number]   Corey Hale, is a 54 y.o. male  WVP:[id number]  NIO:[id number]  DOB - [birth year]  Subjective:  Chief Complaint and HPI: Corey Hale is a 54 y.o. male here today  for a follow up visit after going to the ED 08/21/2019 for an episode of sharp pain.  He left without being seen but EKG did not show any ST changes.  troponins neg.  Needs RF of meds.  Not fasting this morning.  Denies further CP or SOB.  Says he has had similar CP in the past.  He is having nasal/sinus surgery in July.    ED/Hospital notes reviewed.    ROS:   Constitutional:  No f/c, No night sweats, No unexplained weight loss. EENT:  No vision changes, No blurry vision, No hearing changes. No mouth, throat, or ear problems.  Respiratory: No cough, No SOB Cardiac: No CP, no palpitations GI:  No abd pain, No N/V/D. GU: No Urinary s/sx Musculoskeletal: No joint pain Neuro: No headache, no dizziness, no motor weakness.  Skin: No rash Endocrine:  No polydipsia. No polyuria.  Psych: Denies SI/HI  No problems updated.  ALLERGIES: No Known Allergies  PAST MEDICAL HISTORY: Past Medical History:  Diagnosis Date  . Chronic back pain   . Erectile dysfunction   . Goiter   . Herpes simplex   . Hypercholesteremia   . Hypertension   . Perirectal abscess   . Skin abscess   . Thyroid disease   . Tobacco dependence     MEDICATIONS AT HOME: Prior to Admission medications   Medication Sig Start Date End Date Taking? Authorizing Provider  aspirin 81 MG tablet Take 1 tablet (81 mg total) by mouth daily. 03/07/15  Yes Tresa Garter, MD  cyclobenzaprine (FLEXERIL) 10 MG tablet Take 1 tablet (10 mg total) by mouth at bedtime. 04/06/19  Yes Gildardo Pounds, NP  fluticasone (FLONASE) 50 MCG/ACT nasal spray Place 2 sprays into both nostrils daily. 09/16/19  Yes Yer Castello M, PA-C  valACYclovir (VALTREX) 1000 MG tablet TAKE 1 TABLET (1,000 MG  TOTAL) BY MOUTH DAILY. 07/13/19 10/11/19 Yes Gildardo Pounds, NP  atenolol-chlorthalidone (TENORETIC) 50-25 MG tablet Take 1 tablet by mouth daily. Please fill as a 90 day supply 09/16/19 12/15/19  Argentina Donovan, PA-C  pravastatin (PRAVACHOL) 20 MG tablet Take 1 tablet (20 mg total) by mouth daily. Please fill as a 90 day supply 09/16/19 12/15/19  Argentina Donovan, PA-C     Objective:  EXAM:   Vitals:   09/16/19 1129  BP: 127/86  Pulse: 77  Resp: 18  Temp: 98.1 F (36.7 C)  SpO2: 98%  Weight: 204 lb (92.5 kg)  Height: 6' (1.829 m)    General appearance : A&OX3. NAD. Non-toxic-appearing HEENT: Atraumatic and Normocephalic.  PERRLA. EOM intact.   Chest/Lungs:  Breathing-non-labored, Good air entry bilaterally, breath sounds normal without rales, rhonchi, or wheezing  CVS: S1 S2 regular, no murmurs, gallops, rubs  Extremities: Bilateral Lower Ext shows no edema, both legs are warm to touch with = pulse throughout Neurology:  CN II-XII grossly intact, Non focal.   Psych:  TP linear. J/I WNL. Normal speech. Appropriate eye contact and affect.  Skin:  No Rash  Data Review Lab Results  Component Value Date   HGBA1C 5.5 04/06/2019   HGBA1C 5.3 10/29/2017   HGBA1C 5.7 (H) 06/10/2012     Assessment &  Plan   1. Hypokalemia - Comprehensive metabolic panel  2. Essential hypertension Controlled-continue - atenolol-chlorthalidone (TENORETIC) 50-25 MG tablet; Take 1 tablet by mouth daily. Please fill as a 90 day supply  Dispense: 90 tablet; Refill: 1 - Comprehensive metabolic panel  3. Dyslipidemia - pravastatin (PRAVACHOL) 20 MG tablet; Take 1 tablet (20 mg total) by mouth daily. Please fill as a 90 day supply  Dispense: 90 tablet; Refill: 2 - Lipid panel - Comprehensive metabolic panel  4. Nasal congestion - fluticasone (FLONASE) 50 MCG/ACT nasal spray; Place 2 sprays into both nostrils daily.  Dispense: 16 g; Refill: 6  5. Encounter for examination following treatment at  hospital No further episodes.  Resolved.  Left w/o being seen    Patient have been counseled extensively about nutrition and exercise  Return in about 6 months (around 03/17/2020) for PCP;  chronic conditions.  The patient was given clear instructions to go to ER or return to medical center if symptoms don't improve, worsen or new problems develop. The patient verbalized understanding. The patient was told to call to get lab results if they haven't heard anything in the next week.     Freeman Caldron, PA-C [facility] and [place], [place]   09/16/2019, 12:11 PM

## 2019-09-16 NOTE — Progress Notes (Signed)
Here for HFU he was seen in the Ed for chest pain   Need meds refills

## 2019-09-17 LAB — COMPREHENSIVE METABOLIC PANEL
ALT: 15 IU/L (ref 0–44)
AST: 15 IU/L (ref 0–40)
Albumin/Globulin Ratio: 1.4 (ref 1.2–2.2)
Albumin: 4.3 g/dL (ref 3.8–4.9)
Alkaline Phosphatase: 66 IU/L (ref 48–121)
BUN/Creatinine Ratio: 14 (ref 9–20)
BUN: 12 mg/dL (ref 6–24)
Bilirubin Total: 0.3 mg/dL (ref 0.0–1.2)
CO2: 28 mmol/L (ref 20–29)
Calcium: 9.3 mg/dL (ref 8.7–10.2)
Chloride: 99 mmol/L (ref 96–106)
Creatinine, Ser: 0.87 mg/dL (ref 0.76–1.27)
GFR calc Af Amer: 113 mL/min/{1.73_m2} (ref >59)
GFR calc non Af Amer: 98 mL/min/{1.73_m2} (ref >59)
Globulin, Total: 3 g/dL (ref 1.5–4.5)
Glucose: 129 mg/dL — ABNORMAL HIGH (ref 65–99)
Potassium: 3.7 mmol/L (ref 3.5–5.2)
Sodium: 140 mmol/L (ref 134–144)
Total Protein: 7.3 g/dL (ref 6.0–8.5)

## 2019-09-17 LAB — LIPID PANEL
Chol/HDL Ratio: 4.6 ratio (ref 0.0–5.0)
Cholesterol, Total: 175 mg/dL (ref 100–199)
HDL: 38 mg/dL — ABNORMAL LOW (ref >39)
LDL Chol Calc (NIH): 104 mg/dL — ABNORMAL HIGH (ref 0–99)
Triglycerides: 187 mg/dL — ABNORMAL HIGH (ref 0–149)
VLDL Cholesterol Cal: 33 mg/dL (ref 5–40)

## 2019-10-03 ENCOUNTER — Other Ambulatory Visit (HOSPITAL_COMMUNITY): Payer: Medicaid Other

## 2019-10-07 MED FILL — ATENOLOL-CHLORTHALIDONE 50-: 50-25 | 30 days supply | Qty: 30 | Fill #2

## 2019-10-23 IMAGING — US US EXTREM LOW VENOUS*L*
1 series · 13 of 24 positions shown · non-contrast
Comparison: None.

CLINICAL DATA: Left knee pain and swelling for 1 month.



[Series 1: us extrem low venous*left* · 0.07mm/px · 13 of 47 slices shown]
[im 1/47]
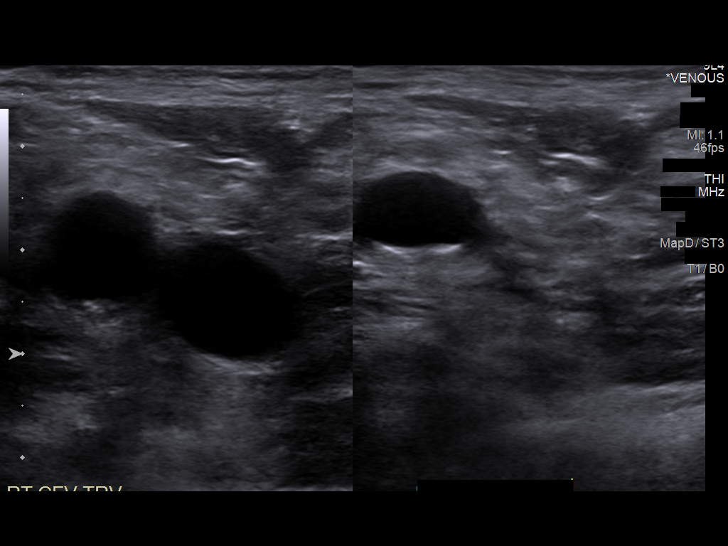
[im 5/47]
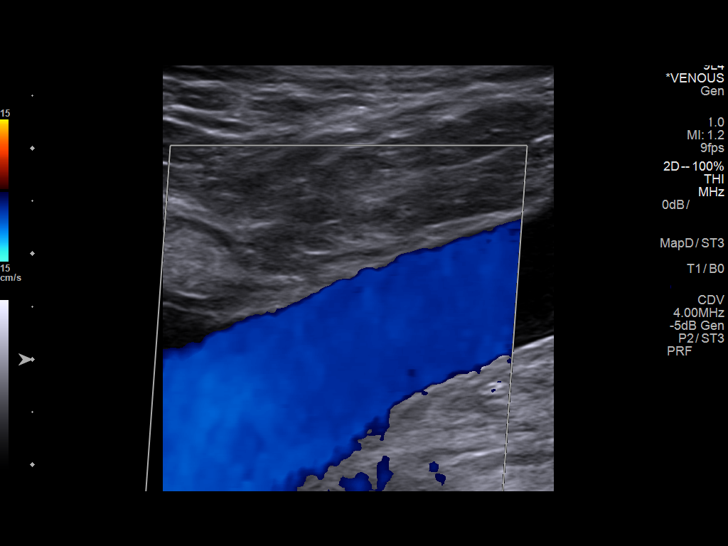
[im 9/47]
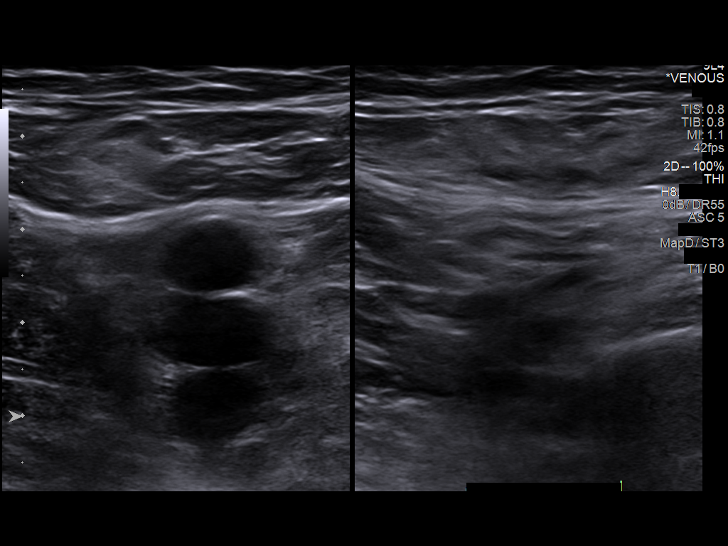
[im 13/47]
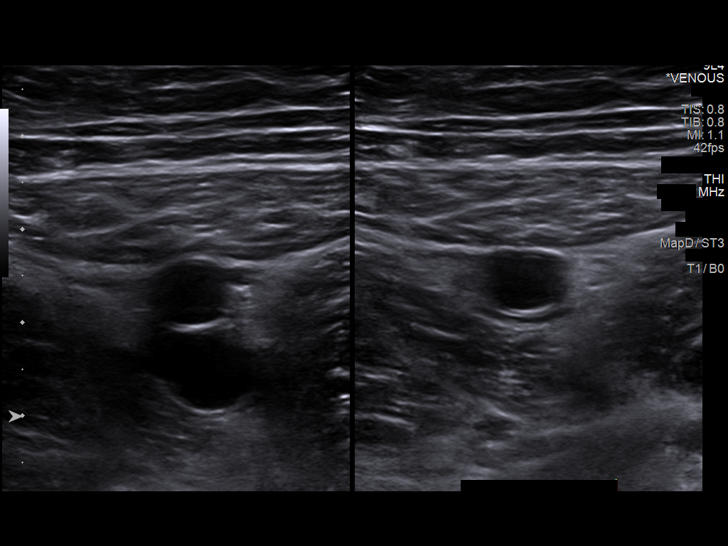
[im 17/47]
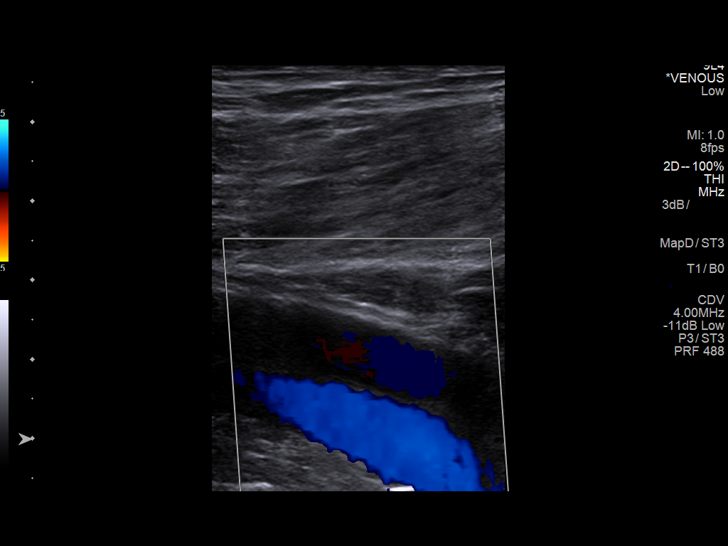
[im 21/47]
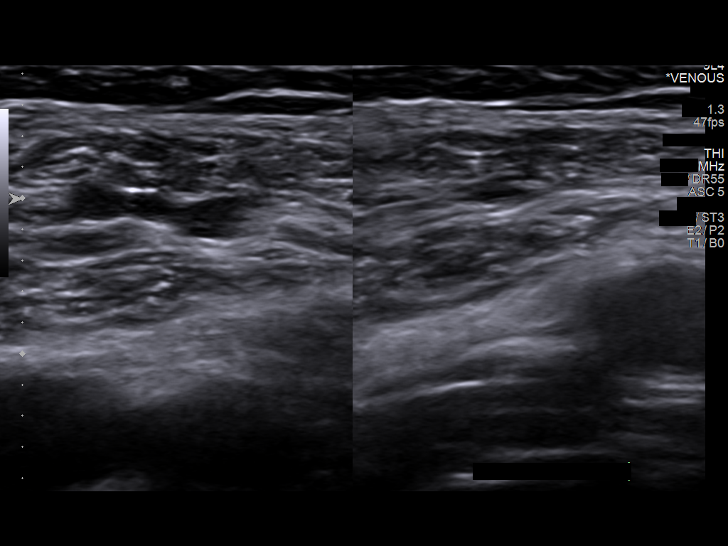
[im 25/47]
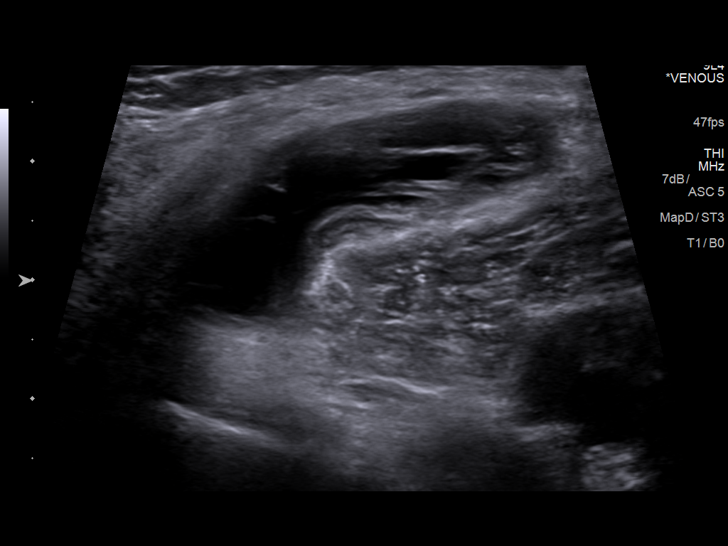
[im 27/47]
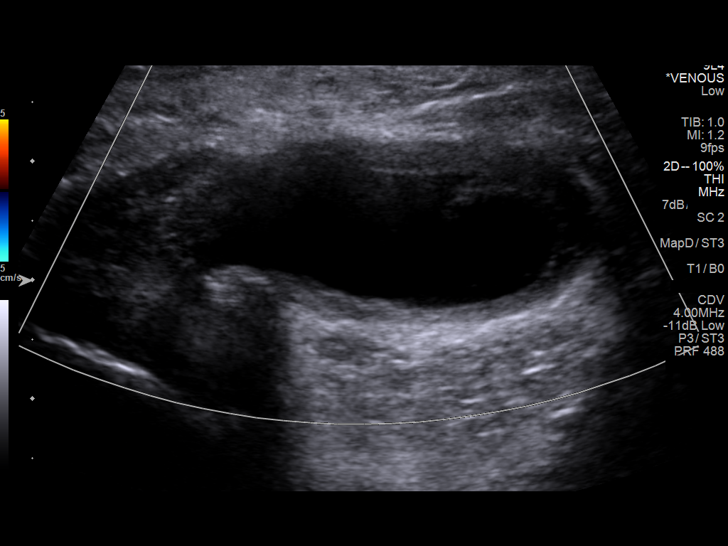
[im 31/47]
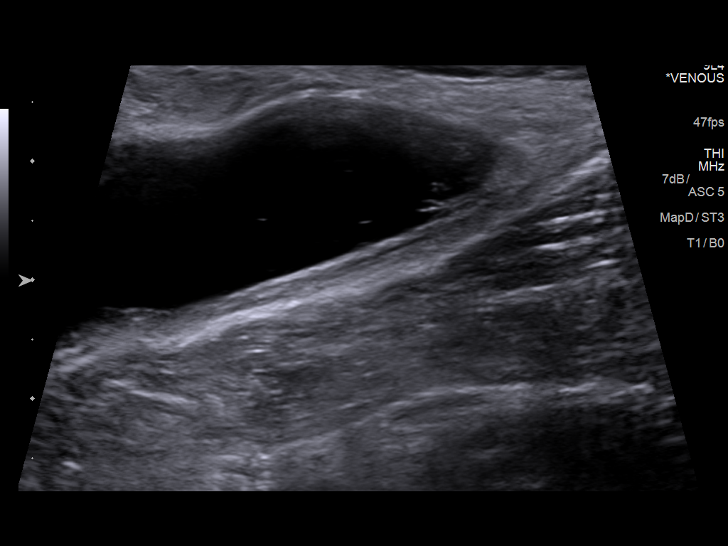
[im 35/47]
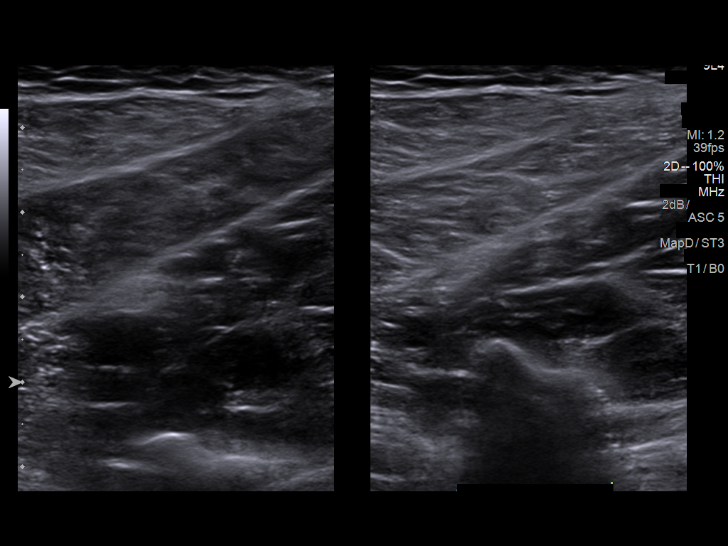
[im 39/47]
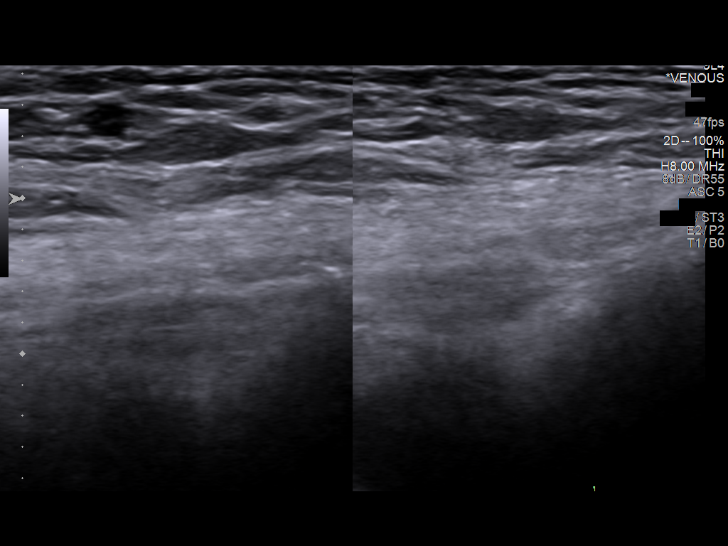
[im 43/47]
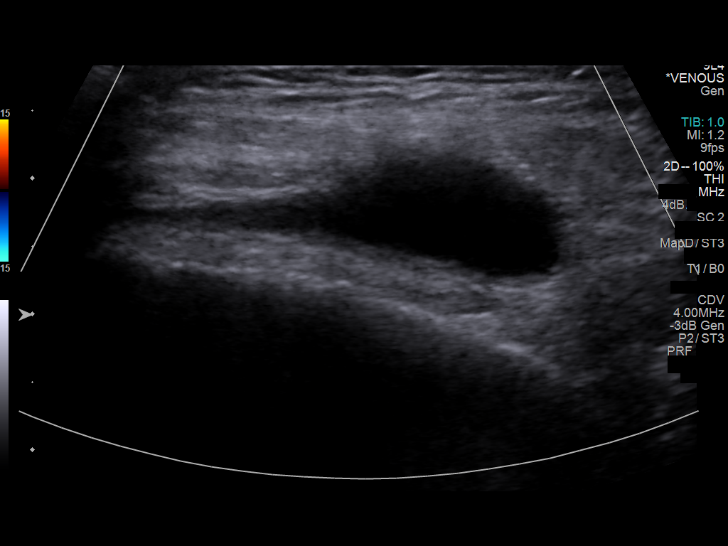
[im 47/47]
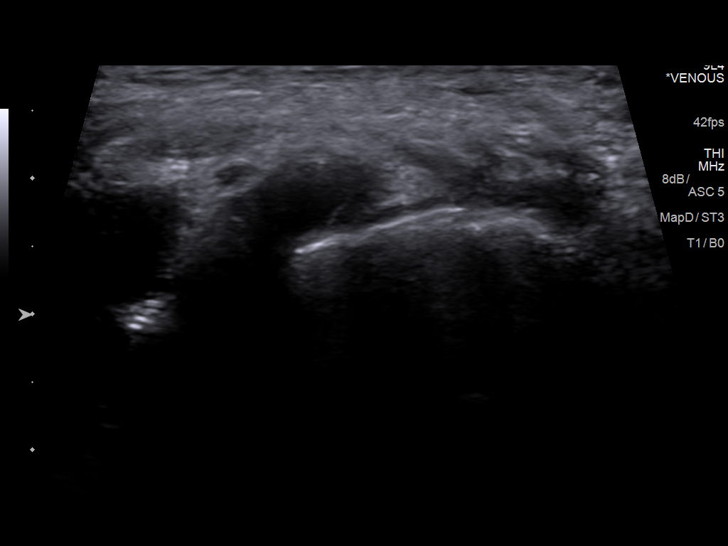

[13 of 24 positions shown; findings below may reference images not displayed]

FINDINGS: Contralateral Common Femoral Vein: Respiratory phasicity is normal
and symmetric with the symptomatic side. No evidence of thrombus.
Normal compressibility.

Common Femoral Vein: No evidence of thrombus. Normal
compressibility, respiratory phasicity and response to augmentation.

Saphenofemoral Junction: No evidence of thrombus. Normal
compressibility and flow on color Doppler imaging.

Profunda Femoral Vein: No evidence of thrombus. Normal
compressibility and flow on color Doppler imaging.

Femoral Vein: No evidence of thrombus. Normal compressibility,
respiratory phasicity and response to augmentation.

Popliteal Vein: No evidence of thrombus. Normal compressibility,
respiratory phasicity and response to augmentation.

Calf Veins: No evidence of thrombus. Normal compressibility and flow
on color Doppler imaging.

Superficial Great Saphenous Vein: No evidence of thrombus. Normal
compressibility.

Venous Reflux:  None.

Other Findings: Complex popliteal cyst measuring 6 cm in longest
dimension. Small knee joint effusion. This was not apparent on the
current radiographs.
IMPRESSION: 1. No evidence of deep venous thrombosis.
2. 6 cm complex Baker's cyst.  Small left knee joint effusion.

## 2019-11-10 MED FILL — ATENOLOL-CHLORTHALIDONE 50-: 50-25 | 30 days supply | Qty: 30 | Fill #0

## 2019-11-10 MED FILL — ?PRAVASTATIN NA 20 MG TAB: 20 | 30 days supply | Qty: 30 | Fill #0

## 2019-11-11 ENCOUNTER — Encounter (HOSPITAL_COMMUNITY): Payer: Self-pay | Admitting: Otolaryngology

## 2019-11-11 ENCOUNTER — Other Ambulatory Visit: Payer: Self-pay

## 2019-11-11 ENCOUNTER — Other Ambulatory Visit (HOSPITAL_COMMUNITY)
Admission: RE | Admit: 2019-11-11 | Discharge: 2019-11-11 | Disposition: A | Discharge status: Home / Self Care | Payer: Medicaid Other | Source: Ambulatory Visit | Attending: Otolaryngology | Admitting: Otolaryngology

## 2019-11-11 DIAGNOSIS — Z20822 Contact with and (suspected) exposure to covid-19: Secondary | ICD-10-CM | POA: Insufficient documentation

## 2019-11-11 DIAGNOSIS — Z01812 Encounter for preprocedural laboratory examination: Secondary | ICD-10-CM | POA: Insufficient documentation

## 2019-11-11 LAB — SARS CORONAVIRUS 2 (TAT 6-24 HRS): SARS Coronavirus 2: NEGATIVE

## 2019-11-11 NOTE — Progress Notes (Signed)
Pt denies SOB, chest pain, and being under the care of a cardiologist. Pt stated that PCP is Geryl Rankins, NP. Pt denies having a stress test, echo and cardiac cath. Pt denies recent labs. Pt stated that he stopped taking Aspirin 5 days ago. Pt made aware to stop taking vitamins, fish oil and herbal medications. Do not take any NSAIDs ie: Ibuprofen, Advil, Naproxen (Aleve), Motrin, BC and Goody Powder. Pt reminded to quarantine. Pt verbalized understanding of all pre-op instructions.

## 2019-11-12 NOTE — Anesthesia Preprocedure Evaluation (Addendum)
Anesthesia Evaluation  Patient identified by MRN, date of birth, ID band Patient awake    Reviewed: Allergy & Precautions, NPO status , Patient's Chart, lab work & pertinent test results, reviewed documented beta blocker date and time   Airway Mallampati: II  TM Distance: >3 FB Neck ROM: Full    Dental no notable dental hx. (+) Teeth Intact, Dental Advisory Given   Pulmonary neg pulmonary ROS, Current Smoker and Patient abstained from smoking.,    Pulmonary exam normal breath sounds clear to auscultation       Cardiovascular hypertension, Pt. on home beta blockers and Pt. on medications Normal cardiovascular exam Rhythm:Regular Rate:Normal  HLD   Neuro/Psych negative neurological ROS  negative psych ROS   GI/Hepatic negative GI ROS, Neg liver ROS,   Endo/Other  negative endocrine ROS  Renal/GU negative Renal ROS  negative genitourinary   Musculoskeletal negative musculoskeletal ROS (+)   Abdominal   Peds  Hematology negative hematology ROS (+)   Anesthesia Other Findings   Reproductive/Obstetrics                            Anesthesia Physical Anesthesia Plan  ASA: III  Anesthesia Plan: General   Post-op Pain Management:    Induction: Intravenous  PONV Risk Score and Plan: 2 and Midazolam, Dexamethasone and Ondansetron  Airway Management Planned: Oral ETT  Additional Equipment:   Intra-op Plan:   Post-operative Plan: Extubation in OR  Informed Consent: I have reviewed the patients History and Physical, chart, labs and discussed the procedure including the risks, benefits and alternatives for the proposed anesthesia with the patient or authorized representative who has indicated his/her understanding and acceptance.     Dental advisory given  Plan Discussed with: CRNA  Anesthesia Plan Comments:         Anesthesia Quick Evaluation

## 2019-11-13 ENCOUNTER — Encounter (HOSPITAL_COMMUNITY)
Admission: RE | Disposition: A | Discharge status: Home / Self Care | Payer: Self-pay | Source: Home / Self Care | Attending: Otolaryngology

## 2019-11-13 ENCOUNTER — Ambulatory Visit (HOSPITAL_COMMUNITY): Payer: Self-pay | Admitting: Certified Registered Nurse Anesthetist

## 2019-11-13 ENCOUNTER — Ambulatory Visit (HOSPITAL_COMMUNITY)
Admission: RE | Admit: 2019-11-13 | Discharge: 2019-11-13 | Disposition: A | Discharge status: Home / Self Care | Payer: Self-pay | Attending: Otolaryngology | Admitting: Otolaryngology

## 2019-11-13 ENCOUNTER — Other Ambulatory Visit: Payer: Self-pay

## 2019-11-13 ENCOUNTER — Encounter (HOSPITAL_COMMUNITY): Payer: Self-pay | Admitting: Otolaryngology

## 2019-11-13 DIAGNOSIS — F17210 Nicotine dependence, cigarettes, uncomplicated: Secondary | ICD-10-CM | POA: Insufficient documentation

## 2019-11-13 DIAGNOSIS — Z79899 Other long term (current) drug therapy: Secondary | ICD-10-CM | POA: Insufficient documentation

## 2019-11-13 DIAGNOSIS — J339 Nasal polyp, unspecified: Secondary | ICD-10-CM | POA: Insufficient documentation

## 2019-11-13 DIAGNOSIS — I10 Essential (primary) hypertension: Secondary | ICD-10-CM | POA: Insufficient documentation

## 2019-11-13 DIAGNOSIS — J343 Hypertrophy of nasal turbinates: Secondary | ICD-10-CM | POA: Insufficient documentation

## 2019-11-13 DIAGNOSIS — E7800 Pure hypercholesterolemia, unspecified: Secondary | ICD-10-CM | POA: Insufficient documentation

## 2019-11-13 DIAGNOSIS — J3489 Other specified disorders of nose and nasal sinuses: Secondary | ICD-10-CM | POA: Insufficient documentation

## 2019-11-13 DIAGNOSIS — E890 Postprocedural hypothyroidism: Secondary | ICD-10-CM | POA: Insufficient documentation

## 2019-11-13 DIAGNOSIS — Z7982 Long term (current) use of aspirin: Secondary | ICD-10-CM | POA: Insufficient documentation

## 2019-11-13 DIAGNOSIS — J342 Deviated nasal septum: Secondary | ICD-10-CM | POA: Insufficient documentation

## 2019-11-13 DIAGNOSIS — J324 Chronic pansinusitis: Secondary | ICD-10-CM | POA: Insufficient documentation

## 2019-11-13 HISTORY — DX: Chronic sinusitis, unspecified: J32.9

## 2019-11-13 HISTORY — PX: SINUS ENDO WITH FUSION: SHX5329

## 2019-11-13 HISTORY — PX: NASAL SEPTOPLASTY W/ TURBINOPLASTY: SHX2070

## 2019-11-13 LAB — CBC
HCT: 41.2 % (ref 39.0–52.0)
Hemoglobin: 12.9 g/dL — ABNORMAL LOW (ref 13.0–17.0)
MCH: 28.7 pg (ref 26.0–34.0)
MCHC: 31.3 g/dL (ref 30.0–36.0)
MCV: 91.8 fL (ref 80.0–100.0)
Platelets: 204 10*3/uL (ref 150–400)
RBC: 4.49 MIL/uL (ref 4.22–5.81)
RDW: 13.1 % (ref 11.5–15.5)
WBC: 8.2 10*3/uL (ref 4.0–10.5)
nRBC: 0 % (ref 0.0–0.2)

## 2019-11-13 LAB — BASIC METABOLIC PANEL WITH GFR
Anion gap: 9 (ref 5–15)
BUN: 10 mg/dL (ref 6–20)
CO2: 28 mmol/L (ref 22–32)
Calcium: 9 mg/dL (ref 8.9–10.3)
Chloride: 104 mmol/L (ref 98–111)
Creatinine, Ser: 0.89 mg/dL (ref 0.61–1.24)
GFR calc Af Amer: >60 mL/min
GFR calc non Af Amer: >60 mL/min
Glucose, Bld: 81 mg/dL (ref 70–99)
Potassium: 3.1 mmol/L — ABNORMAL LOW (ref 3.5–5.1)
Sodium: 141 mmol/L (ref 135–145)

## 2019-11-13 SURGERY — SEPTOPLASTY, NOSE, WITH NASAL TURBINATE REDUCTION
Anesthesia: General | Site: Nose | Laterality: Bilateral

## 2019-11-13 MED ORDER — ACETAMINOPHEN 500 MG PO TABS
1000.0000 mg | ORAL_TABLET | Freq: Once | ORAL | Status: AC
  Administered 2019-11-13: 1000 mg via ORAL
  Filled 2019-11-13: qty 2

## 2019-11-13 MED ORDER — AMOXICILLIN-POT CLAVULANATE 875-125 MG PO TABS: 1.0000 | ORAL_TABLET | Freq: Two times a day (BID) | ORAL | 0 refills | Status: DC

## 2019-11-13 MED ORDER — EPHEDRINE 5 MG/ML INJ
INTRAVENOUS | Status: AC
  Filled 2019-11-13: qty 10

## 2019-11-13 MED ORDER — OXYMETAZOLINE HCL 0.05 % NA SOLN: 1.0000 | Freq: Two times a day (BID) | NASAL | Status: DC | PRN

## 2019-11-13 MED ORDER — EPHEDRINE SULFATE-NACL 50-0.9 MG/10ML-% IV SOSY
PREFILLED_SYRINGE | INTRAVENOUS | Status: DC | PRN
  Administered 2019-11-13: 10 mg via INTRAVENOUS

## 2019-11-13 MED ORDER — LIDOCAINE 2% (20 MG/ML) 5 ML SYRINGE
INTRAMUSCULAR | Status: AC
  Filled 2019-11-13: qty 5

## 2019-11-13 MED ORDER — DEXAMETHASONE SODIUM PHOSPHATE 10 MG/ML IJ SOLN
INTRAMUSCULAR | Status: DC | PRN
  Administered 2019-11-13: 8 mg via INTRAVENOUS

## 2019-11-13 MED ORDER — FENTANYL CITRATE (PF) 250 MCG/5ML IJ SOLN
INTRAMUSCULAR | Status: AC
  Filled 2019-11-13: qty 5

## 2019-11-13 MED ORDER — CEFAZOLIN SODIUM-DEXTROSE 2-4 GM/100ML-% IV SOLN
2.0000 g | INTRAVENOUS | Status: AC
  Administered 2019-11-13: 2 g via INTRAVENOUS
  Filled 2019-11-13: qty 100

## 2019-11-13 MED ORDER — ONDANSETRON HCL 4 MG/2ML IJ SOLN
INTRAMUSCULAR | Status: AC
  Filled 2019-11-13: qty 2

## 2019-11-13 MED ORDER — CHLORHEXIDINE GLUCONATE 0.12 % MT SOLN: 15.0000 mL | Freq: Once | OROMUCOSAL | Status: AC

## 2019-11-13 MED ORDER — OXYMETAZOLINE HCL 0.05 % NA SOLN
NASAL | Status: AC
  Filled 2019-11-13: qty 30

## 2019-11-13 MED ORDER — FENTANYL CITRATE (PF) 250 MCG/5ML IJ SOLN
INTRAMUSCULAR | Status: DC | PRN
Start: 2019-11-13 — End: 2019-11-13
  Administered 2019-11-13: 100 ug via INTRAVENOUS
  Administered 2019-11-13: 50 ug via INTRAVENOUS
  Administered 2019-11-13: 25 ug via INTRAVENOUS
  Administered 2019-11-13: 50 ug via INTRAVENOUS
  Administered 2019-11-13: 25 ug via INTRAVENOUS

## 2019-11-13 MED ORDER — FENTANYL CITRATE (PF) 100 MCG/2ML IJ SOLN: 25.0000 ug | INTRAMUSCULAR | Status: DC | PRN

## 2019-11-13 MED ORDER — LACTATED RINGERS IV SOLN: INTRAVENOUS | Status: DC

## 2019-11-13 MED ORDER — LIDOCAINE 2% (20 MG/ML) 5 ML SYRINGE
INTRAMUSCULAR | Status: DC | PRN
  Administered 2019-11-13: 80 mg via INTRAVENOUS

## 2019-11-13 MED ORDER — MUPIROCIN 2 % EX OINT
TOPICAL_OINTMENT | CUTANEOUS | Status: DC | PRN
  Administered 2019-11-13: 1 via NASAL

## 2019-11-13 MED ORDER — ROCURONIUM BROMIDE 10 MG/ML (PF) SYRINGE
PREFILLED_SYRINGE | INTRAVENOUS | Status: AC
  Filled 2019-11-13: qty 10

## 2019-11-13 MED ORDER — LACTATED RINGERS IV SOLN: INTRAVENOUS | Status: DC | PRN

## 2019-11-13 MED ORDER — LIDOCAINE-EPINEPHRINE 1 %-1:100000 IJ SOLN
INTRAMUSCULAR | Status: DC | PRN
  Administered 2019-11-13: 10 mL

## 2019-11-13 MED ORDER — SODIUM CHLORIDE 0.9 % IR SOLN
Status: DC | PRN
  Administered 2019-11-13: 1000 mL

## 2019-11-13 MED ORDER — MUPIROCIN 2 % EX OINT
TOPICAL_OINTMENT | CUTANEOUS | Status: AC
  Filled 2019-11-13: qty 22

## 2019-11-13 MED ORDER — OXYMETAZOLINE HCL 0.05 % NA SOLN
NASAL | Status: DC | PRN
  Administered 2019-11-13: 1

## 2019-11-13 MED ORDER — PREDNISONE 10 MG PO TABS: ORAL_TABLET | ORAL | 0 refills | Status: DC

## 2019-11-13 MED ORDER — ONDANSETRON HCL 4 MG/2ML IJ SOLN
INTRAMUSCULAR | Status: DC | PRN
  Administered 2019-11-13: 4 mg via INTRAVENOUS

## 2019-11-13 MED ORDER — DEXMEDETOMIDINE (PRECEDEX) IN NS 20 MCG/5ML (4 MCG/ML) IV SYRINGE
PREFILLED_SYRINGE | INTRAVENOUS | Status: DC | PRN
  Administered 2019-11-13: 8 ug via INTRAVENOUS

## 2019-11-13 MED ORDER — PROPOFOL 10 MG/ML IV BOLUS
INTRAVENOUS | Status: AC
  Filled 2019-11-13: qty 20

## 2019-11-13 MED ORDER — DEXAMETHASONE SODIUM PHOSPHATE 10 MG/ML IJ SOLN
INTRAMUSCULAR | Status: AC
  Filled 2019-11-13: qty 1

## 2019-11-13 MED ORDER — ORAL CARE MOUTH RINSE: 15.0000 mL | Freq: Once | OROMUCOSAL | Status: AC

## 2019-11-13 MED ORDER — CHLORHEXIDINE GLUCONATE 0.12 % MT SOLN
OROMUCOSAL | Status: AC
  Administered 2019-11-13: 15 mL via OROMUCOSAL
  Filled 2019-11-13: qty 15

## 2019-11-13 MED ORDER — MIDAZOLAM HCL 2 MG/2ML IJ SOLN
INTRAMUSCULAR | Status: AC
  Filled 2019-11-13: qty 2

## 2019-11-13 MED ORDER — MIDAZOLAM HCL 2 MG/2ML IJ SOLN
INTRAMUSCULAR | Status: DC | PRN
  Administered 2019-11-13: 2 mg via INTRAVENOUS

## 2019-11-13 MED ORDER — SUGAMMADEX SODIUM 200 MG/2ML IV SOLN
INTRAVENOUS | Status: DC | PRN
  Administered 2019-11-13: 200 mg via INTRAVENOUS

## 2019-11-13 MED ORDER — PROPOFOL 10 MG/ML IV BOLUS
INTRAVENOUS | Status: DC | PRN
  Administered 2019-11-13: 150 mg via INTRAVENOUS

## 2019-11-13 MED ORDER — LIDOCAINE-EPINEPHRINE 1 %-1:100000 IJ SOLN
INTRAMUSCULAR | Status: AC
  Filled 2019-11-13: qty 1

## 2019-11-13 MED ORDER — PHENYLEPHRINE HCL-NACL 10-0.9 MG/250ML-% IV SOLN
INTRAVENOUS | Status: DC | PRN
  Administered 2019-11-13: 25 ug/min via INTRAVENOUS

## 2019-11-13 MED ORDER — 0.9 % SODIUM CHLORIDE (POUR BTL) OPTIME
TOPICAL | Status: DC | PRN
  Administered 2019-11-13: 1000 mL

## 2019-11-13 MED ORDER — ROCURONIUM BROMIDE 10 MG/ML (PF) SYRINGE
PREFILLED_SYRINGE | INTRAVENOUS | Status: DC | PRN
  Administered 2019-11-13: 80 mg via INTRAVENOUS

## 2019-11-13 MED FILL — ?PREDINSONE 10MG TABLETS: 10 | 10 days supply | Qty: 46 | Fill #0

## 2019-11-13 MED FILL — AMOX-CLAV 875-125 MG TABLET: 875-125 | 14 days supply | Qty: 14 | Fill #0

## 2019-11-13 SURGICAL SUPPLY — 62 items
BLADE INF TURB ROT M4 2 5PK (BLADE) ×2 IMPLANT
BLADE INF TURB ROT M4 2MM 5PK (BLADE) ×1
BLADE RAD40 ROTATE 4M 4 5PK (BLADE) IMPLANT
BLADE RAD40 ROTATE 4M 4MM 5PK (BLADE)
BLADE RAD60 ROTATE M4 4 5PK (BLADE) ×1 IMPLANT
BLADE RAD60 ROTATE M4 4MM 5PK (BLADE) ×1
BLADE ROTATE TRICUT 4MX13CM M4 (BLADE)
BLADE ROTATE TRICUT 4X13 M4 (BLADE) IMPLANT
BLADE SURG 15 STRL LF DISP TIS (BLADE) IMPLANT
BLADE SURG 15 STRL SS (BLADE) ×3
BLADE TRICUT ROTATE M4 4 5PK (BLADE) ×2 IMPLANT
BLADE TRICUT ROTATE M4 4MM 5PK (BLADE) ×1
CANISTER SUCT 3000ML PPV (MISCELLANEOUS) ×3 IMPLANT
CLOSURE STERI-STRIP 1/2X4 (GAUZE/BANDAGES/DRESSINGS) ×1
CLSR STERI-STRIP ANTIMIC 1/2X4 (GAUZE/BANDAGES/DRESSINGS) ×2 IMPLANT
COAGULATOR SUCT 6 FR SWTCH (ELECTROSURGICAL)
COAGULATOR SUCT SWTCH 10FR 6 (ELECTROSURGICAL) IMPLANT
COVER WAND RF STERILE (DRAPES) ×3 IMPLANT
DRAPE HALF SHEET 40X57 (DRAPES) ×3 IMPLANT
DRESSING NASAL POPE 10X1.5X2.5 (GAUZE/BANDAGES/DRESSINGS) IMPLANT
DRSG NASAL POPE 10X1.5X2.5 (GAUZE/BANDAGES/DRESSINGS)
DRSG NASOPORE 8CM (GAUZE/BANDAGES/DRESSINGS) ×2 IMPLANT
DRSG TELFA 3X8 NADH (GAUZE/BANDAGES/DRESSINGS) ×3 IMPLANT
ELECT REM PT RETURN 9FT ADLT (ELECTROSURGICAL) ×3
ELECTRODE REM PT RTRN 9FT ADLT (ELECTROSURGICAL) IMPLANT
GLOVE BIO SURGEON STRL SZ7.5 (GLOVE) ×3 IMPLANT
GOWN STRL REUS W/ TWL LRG LVL3 (GOWN DISPOSABLE) ×2 IMPLANT
GOWN STRL REUS W/TWL LRG LVL3 (GOWN DISPOSABLE) ×6
KIT BASIN OR (CUSTOM PROCEDURE TRAY) ×3 IMPLANT
KIT TURNOVER KIT B (KITS) ×3 IMPLANT
NDL HYPO 25GX1X1/2 BEV (NEEDLE) IMPLANT
NDL HYPO 25X1 1.5 SAFETY (NEEDLE) ×1 IMPLANT
NDL PRECISIONGLIDE 27X1.5 (NEEDLE) ×1 IMPLANT
NDL SPNL 22GX3.5 QUINCKE BK (NEEDLE) ×1 IMPLANT
NEEDLE HYPO 25GX1X1/2 BEV (NEEDLE) ×3 IMPLANT
NEEDLE HYPO 25X1 1.5 SAFETY (NEEDLE) ×3 IMPLANT
NEEDLE PRECISIONGLIDE 27X1.5 (NEEDLE) ×3 IMPLANT
NEEDLE SPNL 22GX3.5 QUINCKE BK (NEEDLE) ×3 IMPLANT
NS IRRIG 1000ML POUR BTL (IV SOLUTION) ×3 IMPLANT
PAD ARMBOARD 7.5X6 YLW CONV (MISCELLANEOUS) ×6 IMPLANT
PAD DRESSING TELFA 3X8 NADH (GAUZE/BANDAGES/DRESSINGS) ×1 IMPLANT
PAD ENT ADHESIVE 25PK (MISCELLANEOUS) ×3 IMPLANT
PATTIES SURGICAL .5 X3 (DISPOSABLE) ×3 IMPLANT
POSITIONER HEAD DONUT 9IN (MISCELLANEOUS) ×2 IMPLANT
SHEATH ENDOSCRUB 0 DEG (SHEATH) ×3 IMPLANT
SHEATH ENDOSCRUB 30 DEG (SHEATH) ×3 IMPLANT
SHEATH ENDOSCRUB 45 DEG (SHEATH) ×2 IMPLANT
SOL ANTI FOG 6CC (MISCELLANEOUS) ×1 IMPLANT
SOLUTION ANTI FOG 6CC (MISCELLANEOUS) ×2
SPLINT NASAL DOYLE BI-VL (GAUZE/BANDAGES/DRESSINGS) ×3 IMPLANT
SUT CHROMIC 4 0 PS 2 18 (SUTURE) ×3 IMPLANT
SUT CHROMIC GUT 2 0 PS 2 27 (SUTURE) ×3 IMPLANT
SUT ETHILON 3 0 PS 1 (SUTURE) IMPLANT
SWAB COLLECTION DEVICE MRSA (MISCELLANEOUS) IMPLANT
SYR 50ML SLIP (SYRINGE) IMPLANT
TRACKER ENT INSTRUMENT (MISCELLANEOUS) ×2 IMPLANT
TRACKER ENT PATIENT (MISCELLANEOUS) ×2 IMPLANT
TRAY ENT MC OR (CUSTOM PROCEDURE TRAY) ×3 IMPLANT
TUBE CONNECTING 12'X1/4 (SUCTIONS) ×2
TUBE CONNECTING 12X1/4 (SUCTIONS) ×3 IMPLANT
TUBING STRAIGHTSHOT EPS 5PK (TUBING) ×2 IMPLANT
WATER STERILE IRR 1000ML POUR (IV SOLUTION) ×3 IMPLANT

## 2019-11-13 NOTE — Brief Op Note (Signed)
11/13/2019  10:17 AM  PATIENT:  Corey Hale  54 y.o. male  PRE-OPERATIVE DIAGNOSIS:  DEVIATED SEPTUM,TURBINATE HYPERTROPHY, NASAL POLYPS, CHRONIC SINUSITIS  POST-OPERATIVE DIAGNOSIS:  DEVIATED SEPTUM,TURBINATE HYPERTROPHY, NASAL POLYPS, CHRONIC SINUSITIS  PROCEDURE:  Procedure(s): NASAL SEPTOPLASTY WITH TURBINATE REDUCTION (Bilateral) SINUS ENDO WITH FUSION (Bilateral)  Bilateral total ethmoidectomy Bilateral frontal recess exploration Bilateral sphenoidotomy Bilateral maxillary antrostomy  SURGEON:  Surgeon(s) and Role:    Melida Quitter, MD - Primary  PHYSICIAN ASSISTANT:   ASSISTANTS: none   ANESTHESIA:   general  EBL:  600 cc   BLOOD ADMINISTERED:none  DRAINS: none   LOCAL MEDICATIONS USED:  LIDOCAINE   SPECIMEN:  Source of Specimen:  Sinus contents  DISPOSITION OF SPECIMEN:  PATHOLOGY  COUNTS:  YES  TOURNIQUET:  * No tourniquets in log *  DICTATION: .Note written in EPIC  PLAN OF CARE: Discharge to home after PACU  PATIENT DISPOSITION:  PACU - hemodynamically stable.   Delay start of Pharmacological VTE agent (>24hrs) due to surgical blood loss or risk of bleeding: yes

## 2019-11-13 NOTE — Transfer of Care (Signed)
Immediate Anesthesia Transfer of Care Note  Patient: Corey Hale  Procedure(s) Performed: NASAL SEPTOPLASTY WITH TURBINATE REDUCTION (Bilateral Nose) SINUS ENDO WITH FUSION (Bilateral Nose)  Patient Location: PACU  Anesthesia Type:General  Level of Consciousness: drowsy and patient cooperative  Airway & Oxygen Therapy: Patient Spontanous Breathing  Post-op Assessment: Report given to RN and Post -op Vital signs reviewed and stable  Post vital signs: Reviewed and stable  Last Vitals:  Vitals Value Taken Time  BP 132/82 11/13/19 1033  Temp    Pulse 88 11/13/19 1037  Resp 12 11/13/19 1037  SpO2 98 % 11/13/19 1037  Vitals shown include unvalidated device data.  Last Pain:  Vitals:   11/13/19 0615  TempSrc:   PainSc: 0-No pain      Patients Stated Pain Goal: 6 (02/72/53 6644)  Complications: No complications documented.

## 2019-11-13 NOTE — H&P (Signed)
Corey Hale is an 54 y.o. male.   Chief Complaint: Sinus disease HPI: 54 year old male with nasal obstruction due to nasal polyps, septal deviation, and turbinate hypertrophy.  He presents for surgical management.  Past Medical History:  Diagnosis Date  . Chronic back pain   . Chronic sinusitis    nasal polyps, turbinate hypertrophy, deviated septum  . Erectile dysfunction   . Goiter   . Herpes simplex   . Hypercholesteremia   . Hypertension   . Perirectal abscess   . Skin abscess   . Thyroid disease   . Tobacco dependence     Past Surgical History:  Procedure Laterality Date  . ARM HARDWARE REMOVAL Right   . INCISION AND DRAINAGE PERIRECTAL ABSCESS N/A 07/22/2017   Procedure: IRRIGATION, DEBRIDEMENT AND DRAINAGE PERIRECTAL ABSCESS;  Surgeon: [name], MD;  Location: [facility];  Service: General;  Laterality: N/A;  . NECK SURGERY    . PAROTIDECTOMY     Dr Redmond Baseman  . THYROIDECTOMY, PARTIAL  2008   Dr Marlou Starks    Family History  Problem Relation Age of Onset  . Stroke Mother   . Stroke Father    Social History:  reports that he has been smoking cigarettes. He has been smoking about 0.25 packs per day. He has never used smokeless tobacco. He reports that he does not drink alcohol and does not use drugs.  Allergies:  Allergies  Allergen Reactions  . Pork-Derived Products     No pork products; pt is a Muslim    Medications Prior to Admission  Medication Sig Dispense Refill  . aspirin 81 MG tablet Take 1 tablet (81 mg total) by mouth daily. 90 tablet 3  . atenolol-chlorthalidone (TENORETIC) 50-25 MG tablet Take 1 tablet by mouth daily. Please fill as a 90 day supply 90 tablet 1  . docusate sodium (COLACE) 50 MG capsule Take 50 mg by mouth daily as needed for mild constipation.    . fluticasone (FLONASE) 50 MCG/ACT nasal spray Place 2 sprays into both nostrils daily. (Patient taking differently: Place 2 sprays into both nostrils daily as needed for allergies. ) 16 g 6  .  oxyCODONE (ROXICODONE) 15 MG immediate release tablet Take 15 mg by mouth 5 (five) times daily as needed for pain.    . pravastatin (PRAVACHOL) 20 MG tablet Take 1 tablet (20 mg total) by mouth daily. Please fill as a 90 day supply 90 tablet 2  . cyclobenzaprine (FLEXERIL) 10 MG tablet Take 1 tablet (10 mg total) by mouth at bedtime. (Patient not taking: Reported on 11/10/2019) 30 tablet 1    Results for orders placed or performed during the hospital encounter of 11/13/19 (from the past 48 hour(s))  Basic metabolic panel per protocol     Status: Abnormal   Collection Time: 11/13/19  6:13 AM  Result Value Ref Range   Sodium 141 135 - 145 mmol/L   Potassium 3.1 (L) 3.5 - 5.1 mmol/L   Chloride 104 98 - 111 mmol/L   CO2 28 22 - 32 mmol/L   Glucose, Bld 81 70 - 99 mg/dL    Comment: Glucose reference range applies only to samples taken after fasting for at least 8 hours.   BUN 10 6 - 20 mg/dL   Creatinine, Ser 0.89 0.61 - 1.24 mg/dL   Calcium 9.0 8.9 - 10.3 mg/dL   GFR calc non Af Amer >60 >60 mL/min   GFR calc Af Amer >60 >60 mL/min   Anion  gap 9 5 - 15    Comment: Performed at [facility], [place] [address] [place], [place] [address]  CBC per protocol     Status: Abnormal   Collection Time: 11/13/19  6:13 AM  Result Value Ref Range   WBC 8.2 4.0 - 10.5 K/uL   RBC 4.49 4.22 - 5.81 MIL/uL   Hemoglobin 12.9 (L) 13.0 - 17.0 g/dL   HCT 41.2 39 - 52 %   MCV 91.8 80.0 - 100.0 fL   MCH 28.7 26.0 - 34.0 pg   MCHC 31.3 30.0 - 36.0 g/dL   RDW 13.1 11.5 - 15.5 %   Platelets 204 150 - 400 K/uL   nRBC 0.0 0.0 - 0.2 %    Comment: Performed at [facility], [place] [address], [place], [place] [address]   No results found.  Review of Systems  All other systems reviewed and are negative.   Blood pressure 140/90, pulse 60, temperature 97.9 F (36.6 C), temperature source Oral, resp. rate 17, height 6' (1.829 m), weight 90.7 kg, SpO2 99 %. Physical Exam Constitutional:      Appearance:  Normal appearance. He is normal weight.  HENT:     Head: Normocephalic and atraumatic.     Right Ear: External ear normal.     Left Ear: External ear normal.     Nose: Nose normal.     Mouth/Throat:     Mouth: Mucous membranes are moist.     Pharynx: Oropharynx is clear.  Eyes:     Extraocular Movements: Extraocular movements intact.     Conjunctiva/sclera: Conjunctivae normal.     Pupils: Pupils are equal, round, and reactive to light.  Cardiovascular:     Rate and Rhythm: Normal rate.  Pulmonary:     Effort: Pulmonary effort is normal.  Skin:    General: Skin is warm and dry.  Neurological:     General: No focal deficit present.     Mental Status: He is alert and oriented to person, place, and time.  Psychiatric:        Mood and Affect: Mood normal.        Behavior: Behavior normal.        Thought Content: Thought content normal.        Judgment: Judgment normal.      Assessment/Plan Nasal polyps, chronic pansinusitis, septal deviation, and turbinate hypertrophy  To OR for endoscopic sinus surgery, septoplasty, and turbinate reduction.  Melida Quitter, MD 11/13/2019, 7:26 AM

## 2019-11-13 NOTE — Anesthesia Postprocedure Evaluation (Signed)
Anesthesia Post Note  Patient: Corey Hale  Procedure(s) Performed: NASAL SEPTOPLASTY WITH TURBINATE REDUCTION (Bilateral Nose) SINUS ENDO WITH FUSION (Bilateral Nose)     Patient location during evaluation: PACU Anesthesia Type: General Level of consciousness: awake and alert Pain management: pain level controlled Vital Signs Assessment: post-procedure vital signs reviewed and stable Respiratory status: spontaneous breathing, nonlabored ventilation, respiratory function stable and patient connected to nasal cannula oxygen Cardiovascular status: blood pressure returned to baseline and stable Postop Assessment: no apparent nausea or vomiting Anesthetic complications: no   No complications documented.  Last Vitals:  Vitals:   11/13/19 1118 11/13/19 1130  BP: 103/73 108/74  Pulse: 84 82  Resp: 13 13  Temp:  36.7 C  SpO2: 96% 96%    Last Pain:  Vitals:   11/13/19 1130  TempSrc:   PainSc: 0-No pain                 Lenoria Narine L Eyad Rochford

## 2019-11-13 NOTE — Op Note (Signed)
PREOPERATIVE DIAGNOSIS:  Chronic pansinusitis   POSTOPERATIVE DIAGNOSIS:  Chronic pansinusitis   PROCEDURE:  Bilateral total ethmoidectomy, bilateral frontal sinus exploration, bilateral maxillary antrostomy, bilateral sphenoidotomy, Fusion image guidance, septoplasty, bilateral turbinate reduction   SURGEON:  Melida Quitter, MD   ANESTHESIA:  General endotracheal anesthesia   COMPLICATIONS:  None   INDICATIONS:  The patient is a 54 year old male with a history of nasal obstruction due to septal deviation, turbinate hypertrophy, and nasal polyps that has not responeded to medical therapy.  He presents to the operating room for surgical management.   FINDINGS:  Leftward inferior septal deviation.  Enlarged turbinates, more on the right.  Polyps in both middle and superior meatus regions.  Thick mucus in both maxillary and frontal sinuses.   DESCRIPTION OF PROCEDURE:  The patient was identified in the holding room, informed consent having been obtained including discussion of risks, benefits and alternatives, the patient was brought to the operative suite and put the operative table in  supine position.  Anesthesia was induced and the patient was intubated by the anesthesia team without difficulty.  The eyes were lubricated and the Fusion antenna was placed.  The patient was given intravenous antibiotics during the case.  The face was prepped and draped in sterile fashion.  The patient was registered to the Fusion system in the standard fashion.  The eyes were taped closed and Afrin-soaked pledgets were placed in both sides of the nose.  After registering instruments, the right-sided pledgets were removed and the nasal passage was inspected with a straight telescope.  The lateral nasal wall was injected with local anesthetic.  Polyps in the middle meatus were then removed with the microdebrider.  The uncinate process was inverted and was removed using the microdebrider.  The ethmoid bulla and anterior  cells were then removed using the microdebrider.  The basal lamella was penetrated and posterior cells removed.  Image guidance was used to assist.  An angled telescope was used to evaluate the maxillary opening that was widened posteriorly and inferiorly.  Thick mucus was suctioned from the sinus.  The middle turbinate was then lateralized and the posterior septum was injected with local anesthetic.  Polyps and the lower portion of the superior turbinate were removed with the microdebrider.  The natural opening of the sphenoid sinus was identified and widened superiorly and laterally.  The middle turbinate was re-medialized.  The anterior skull base was then inspected with an angled telescope.  Septations were removed using the microdebrider in a retrograde fashion to the frontal recess.  Image guidance was used.  The frontal recess was then dissected of septations using the microdebrider.  The natural frontal recess was identified and surround septations removed leaving a widely patent pathway.  Image guidance was used.  Thick mucus was suctioned.  At this point, Afrin-soaked pledgets were placed in the sinuses.  Attention was then directed to the left side where the same procedures were performed including injection of local anesthetic, removal of middle and superior meatus polyps, removal of the uncinate process, widening of the maxillary opening, removal of anterior and posterior ethmoid air cells, widening of the sphenoid opening, dissection of the anterior skull base, clearing of the frontal recess, and placement of Afrin-soaked pledgets.   The septum was then injected with local anesthetic on both sides.  A left-sided Killian incision was made and the mucoperichondral flap was elevated down the left side.  The inferior deviation was then removed by removing a portion of cartilage and  then isolating the bony portion.  An osteotome was used to remove this portion.  With some further removal of posterior  bone, the septum laid in the midline.  Soft tissues were redraped with a tear in the left-sided flap.  The inferior turbinates were injected with local anesthetic.  An incision was made at the anterior head of each inferior turbinate and soft tissues were elevated off of the underlying bone using a freer elevator.  The microdebrider was then used with the turbinate blade to remove submucosal tissue keeping the overlying mucosa and underlying bone intact.  Soft tissues were then redraped and the turbinates out-fractured.  The nasal passage was suctioned.    Pledgets were then removed and the sinuses inspected.  Some additional minor work was done to clear small fragments and to smooth surfaces.  Half of a Nasopore pack was then placed in each ethmoid sinus coated with mupirocin ointment.  Each pack was saturated with saline.  Doyle stents coated in mupirocin ointment were then placed in both nasal passages.  The Killian incision was closed with 4-0 chromic in a simple, interrupted fashion.  Stents were placed in position and secured with a through-and-through vertical mattress suture using 2-0 Vicryl.  Nasal passages and the throat were suctioned.   Drapes were removed and the patient was cleaned off.  The patient was returned to anesthesia for wakeup, extubated, and taken to the recovery room in stable condition.

## 2019-11-13 NOTE — Anesthesia Procedure Notes (Signed)
Procedure Name: Intubation Date/Time: 11/13/2019 8:00 AM Performed by: Kathryne Hitch, CRNA Pre-anesthesia Checklist: Patient identified, Emergency Drugs available, Suction available and Patient being monitored Patient Re-evaluated:Patient Re-evaluated prior to induction Oxygen Delivery Method: Circle system utilized Preoxygenation: Pre-oxygenation with 100% oxygen Induction Type: IV induction Ventilation: Mask ventilation without difficulty Laryngoscope Size: Miller and 3 Grade View: Grade I Tube type: Oral Tube size: 7.5 mm Number of attempts: 1 Airway Equipment and Method: Stylet and Oral airway Placement Confirmation: ETT inserted through vocal cords under direct vision,  positive ETCO2 and breath sounds checked- equal and bilateral Secured at: 23 cm Tube secured with: Tape Dental Injury: Teeth and Oropharynx as per pre-operative assessment

## 2019-11-13 NOTE — Progress Notes (Signed)
Pt stable and in phase ii. Awaiting ride currently. No need to continue monitoring.

## 2019-11-14 ENCOUNTER — Encounter (HOSPITAL_COMMUNITY): Payer: Self-pay | Admitting: Otolaryngology

## 2019-11-18 LAB — AEROBIC/ANAEROBIC CULTURE W GRAM STAIN (SURGICAL/DEEP WOUND): Culture: NORMAL

## 2019-12-10 MED FILL — ?PRAVASTATIN NA 20 MG TAB: 20 | 30 days supply | Qty: 30 | Fill #1

## 2019-12-10 MED FILL — valACYclovir HCL 1 GM TABS: 1 | 30 days supply | Qty: 30 | Fill #1

## 2019-12-10 MED FILL — ATENOLOL-CHLORTHALIDONE 50-: 50-25 | 30 days supply | Qty: 30 | Fill #1

## 2020-01-11 ENCOUNTER — Other Ambulatory Visit: Payer: Self-pay | Admitting: Nurse Practitioner

## 2020-01-11 ENCOUNTER — Ambulatory Visit: Payer: Self-pay | Admitting: *Deleted

## 2020-01-11 DIAGNOSIS — A6000 Herpesviral infection of urogenital system, unspecified: Secondary | ICD-10-CM

## 2020-01-11 MED FILL — ATENOLOL-CHLORTHAL 50-25mg: 50-25 | 30 days supply | Qty: 30 | Fill #2

## 2020-01-11 NOTE — Telephone Encounter (Signed)
Pt contacted, stated he will be seen at the [facility] for given symptoms.

## 2020-01-11 NOTE — Telephone Encounter (Signed)
Call disconnected prior to talking to patient. Attempted to call patient back for c/o chest pain, no answer and left message to call back.  Attempted to call patient's contact , brother, Mattison Stuckey, no answer and left message to call back.

## 2020-01-11 NOTE — Telephone Encounter (Signed)
Returned call to patient and patient reports chest pain is due to stress and anxiety. Denies SOB, difficulty breathing, chest pain now. Patient reports he was seen last week for chest pain and Dr. Reported everything was ok with his heart and lungs. C/o "indigestion" 2-3 hours after eating at times. Patient reports he is under a lot of stress and requesting medication to help him for the stress. Care advise given. Patient verbalized understanding of care advise and to call back or go to ED if symptoms worsen. Attempted to make appt but no appt available until December. Patient requesting if he can get appt sooner, Please advise.  Reason for Disposition  Chest pain(s) lasting a few seconds  Answer Assessment - Initial Assessment Questions 1. LOCATION: "Where does it hurt?"       Chest  2. RADIATION: "Does the pain go anywhere else?" (e.g., into neck, jaw, arms, back)     na 3. ONSET: "When did the chest pain begin?" (Minutes, hours or days)      Saw a Dr. Last week and no medical issues noted with heart and lungs 4. PATTERN "Does the pain come and go, or has it been constant since it started?"  "Does it get worse with exertion?"      Chest pain is from stress 5. DURATION: "How long does it last" (e.g., seconds, minutes, hours)     na 6. SEVERITY: "How bad is the pain?"  (e.g., Scale 1-10; mild, moderate, or severe)    - MILD (1-3): doesn't interfere with normal activities     - MODERATE (4-7): interferes with normal activities or awakens from sleep    - SEVERE (8-10): excruciating pain, unable to do any normal activities       na 7. CARDIAC RISK FACTORS: "Do you have any history of heart problems or risk factors for heart disease?" (e.g., angina, prior heart attack; diabetes, high blood pressure, high cholesterol, smoker, or strong family history of heart disease)     No recently assessed and no issues noted  8. PULMONARY RISK FACTORS: "Do you have any history of lung disease?"  (e.g., blood  clots in lung, asthma, emphysema, birth control pills)     no 9. CAUSE: "What do you think is causing the chest pain?"     Anxiety and stress 10. OTHER SYMPTOMS: "Do you have any other symptoms?" (e.g., dizziness, nausea, vomiting, sweating, fever, difficulty breathing, cough)       no  Protocols used: CHEST PAIN-A-AH

## 2020-01-11 NOTE — Telephone Encounter (Signed)
Will forward to pcp

## 2020-01-12 MED FILL — ?PRAVASTATIN NA 20MG TABL: 20 | 30 days supply | Qty: 30 | Fill #2

## 2020-01-16 NOTE — Telephone Encounter (Signed)
Noted. Agree with recommendations.  

## 2020-01-18 MED FILL — ?VALACYCLOVIR HCL 1 GM TABS: 1 | 30 days supply | Qty: 30 | Fill #0

## 2020-02-05 MED FILL — ATENOLOL-CHLORTHAL 50-25mg: 50-25 | 30 days supply | Qty: 30 | Fill #3

## 2020-03-08 MED FILL — ?VALACYCLOVIR HCL 1 GM TABS: 1 | 30 days supply | Qty: 30 | Fill #1

## 2020-03-08 MED FILL — ?PRAVASTATIN NA 20MG TABL: 20 | 30 days supply | Qty: 30 | Fill #3

## 2020-03-08 MED FILL — ATENOLOL-CHLORTHAL 50-25mg: 50-25 | 30 days supply | Qty: 30 | Fill #4

## 2020-03-28 MED FILL — ?VALACYCLOVIR HCL 1 GM TABS: 1 | 30 days supply | Qty: 30 | Fill #2

## 2020-04-07 MED FILL — ATENOLOL-CHLORTHAL 50-25mg: 50-25 | 30 days supply | Qty: 30 | Fill #5

## 2020-04-07 MED FILL — PRAVASTATIN SODIUM 20 MG TA: 20 | 30 days supply | Qty: 30 | Fill #4

## 2020-04-25 ENCOUNTER — Telehealth: Payer: Self-pay | Admitting: Nurse Practitioner

## 2020-04-25 NOTE — Telephone Encounter (Signed)
Copied from [facility] [id number]. Topic: Quick Communication - See Telephone Encounter >> Apr 25, 2020  3:44 PM [name] wrote: CRM for notification. See Telephone encounter for: 04/25/20. Pt is wanting an appt as soon as possible as  after he after eating is burping food back up. He will take other providers as he is worried that it is something going on Please FU as tried all PCP all into March FU  [phone]   Called patient and LVM to schedule an appointment. Patient can be scheduled with mobile medicine unit. Patient can call [phone] to schedule with MMu.

## 2020-05-11 ENCOUNTER — Other Ambulatory Visit: Payer: Self-pay | Admitting: Physician Assistant

## 2020-05-11 ENCOUNTER — Other Ambulatory Visit: Payer: Self-pay | Admitting: Nurse Practitioner

## 2020-05-11 DIAGNOSIS — I10 Essential (primary) hypertension: Secondary | ICD-10-CM

## 2020-05-11 MED FILL — PRAVASTATIN SODIUM 20 MG TA: 20 | 30 days supply | Qty: 30 | Fill #5

## 2020-05-11 MED FILL — ATENOLOL-CHLORTHAL 50-25mg: 50-25 | 30 days supply | Qty: 30 | Fill #0

## 2020-05-26 MED FILL — ?VALACYCLOVIR HCL 1 GM TABS: 1 | 30 days supply | Qty: 30 | Fill #3

## 2020-05-31 ENCOUNTER — Encounter: Payer: Self-pay | Admitting: Internal Medicine

## 2020-05-31 ENCOUNTER — Ambulatory Visit: Payer: Self-pay | Attending: Internal Medicine | Admitting: Internal Medicine

## 2020-05-31 ENCOUNTER — Other Ambulatory Visit: Payer: Self-pay

## 2020-05-31 DIAGNOSIS — Z1211 Encounter for screening for malignant neoplasm of colon: Secondary | ICD-10-CM

## 2020-05-31 DIAGNOSIS — F17200 Nicotine dependence, unspecified, uncomplicated: Secondary | ICD-10-CM

## 2020-05-31 DIAGNOSIS — K219 Gastro-esophageal reflux disease without esophagitis: Secondary | ICD-10-CM

## 2020-05-31 NOTE — Progress Notes (Signed)
DOB and name verified  C/o burping and bloating, regurgitation, stated "feeling like food or liquid is coming back"  X 2 months on and off

## 2020-05-31 NOTE — Progress Notes (Signed)
   Virtual Visit via Telephone Note  I connected with Corey Hale on 05/31/20 at 2:30 PM by telephone and verified that I am speaking with the correct person using two identifiers.  Location: Patient:  Provider: home   I discussed the limitations, risks, security and privacy concerns of performing an evaluation and management service by telephone and the availability of in person appointments. I also discussed with the patient that there may be a patient responsible charge related to this service. The patient expressed understanding and agreed to proceed.   History of Present Illness: Pt c/o Gerd x 2 mos intermittently. Occas regurgitation of food when he burps. Appetite normal, no wt loss, no abd pain, no n/v, no melena, no hematochezia. Tried omeprazole 20 mg daily with improved heartburn, but continues to regurg food. No dysphagia, no early satiety. Similar sx yrs ago. Sx improved with standing, walking. No sx when laying supine,  Sx exac by large meals,  +tob few cigs/day, no etoh Coffee 2-3 cups/day No prior endoscopy or colonoscopy No fhx cancer  Observations/Objective:   Assessment and Plan: 1. Gastroesophageal reflux disease, unspecified whether esophagitis present Discussed diet, meal planning, advised to d/c caffeine, tob, eat small, freq meals. Continue omeprazole - CBC - Comprehensive metabolic panel - Ambulatory referral to Gastroenterology - discussed ddx, including stomach ca, egd needed for further eval. Call if sx worsen.   2. Tobacco dependence Discussed assoc risks, advised to d/c  3. Screening for colon cancer GI ref for colonoscopy    Follow Up Instructions:    I discussed the assessment and treatment plan with the patient. The patient was provided an opportunity to ask questions and all were answered. The patient agreed with the plan and demonstrated an understanding of the instructions.   The patient was advised to call back or seek an  in-person evaluation if the symptoms worsen or if the condition fails to improve as anticipated.  I provided 35 minutes of non-face-to-face time during this encounter.   Kimber Relic, MD

## 2020-06-10 ENCOUNTER — Other Ambulatory Visit: Payer: Self-pay | Admitting: Nurse Practitioner

## 2020-06-10 DIAGNOSIS — I10 Essential (primary) hypertension: Secondary | ICD-10-CM

## 2020-06-10 MED FILL — ATENOLOL-CHLORTHAL 50-25mg: 50-25 | 30 days supply | Qty: 30 | Fill #0

## 2020-06-10 MED FILL — PRAVASTATIN SODIUM 20 MG TA: 20 | 30 days supply | Qty: 30 | Fill #6

## 2020-06-10 NOTE — Telephone Encounter (Signed)
Requested Prescriptions  Pending Prescriptions Disp Refills  . atenolol-chlorthalidone (TENORETIC) 50-25 MG tablet [Pharmacy Med Name: ATENOLOL-CHLORTHAL 50-25mg  50-25 Tablet] 30 tablet 0    Sig: TAKE 1 TABLET BY MOUTH DAILY.     Cardiovascular: Beta Blocker + Diuretic Combos Failed - 06/10/2020  7:17 AM      Failed - K in normal range and within 180 days    Potassium  Date Value Ref Range Status  11/13/2019 3.1 (L) 3.5 - 5.1 mmol/L Final         Failed - Na in normal range and within 180 days    Sodium  Date Value Ref Range Status  11/13/2019 141 135 - 145 mmol/L Final  09/16/2019 140 134 - 144 mmol/L Final         Failed - Cr in normal range and within 180 days    Creat  Date Value Ref Range Status  02/23/2014 0.89 0.50 - 1.35 mg/dL Final   Creatinine, Ser  Date Value Ref Range Status  11/13/2019 0.89 0.61 - 1.24 mg/dL Final   Creatinine,U  Date Value Ref Range Status  11/26/2014 171.66 mg/dL Final    Comment:       Cutoff Values for Urine Drug Screen, Pain Mgmt          Drug Class           Cutoff (ng/mL)          Amphetamines             500          Barbiturates             200          Cocaine Metabolites      150          Benzodiazepines          200          Methadone                300          Opiates                  300          Phencyclidine             25          Propoxyphene             300          Marijuana Metabolites     50    For medical purposes only.          Failed - Ca in normal range and within 180 days    Calcium  Date Value Ref Range Status  11/13/2019 9.0 8.9 - 10.3 mg/dL Final   Calcium, Ion  Date Value Ref Range Status  06/04/2009 1.12 1.12 - 1.32 mmol/L Final         Passed - Patient is not pregnant      Passed - Last BP in normal range    BP Readings from Last 1 Encounters:  11/13/19 108/74         Passed - Last Heart Rate in normal range    Pulse Readings from Last 1 Encounters:  11/13/19 82         Passed -  Valid encounter within last 6 months    Recent Outpatient Visits          1 week ago Gastroesophageal  reflux disease, unspecified whether esophagitis present   [place] Kimber Relic, MD   8 months ago Hypokalemia   [place] [employer], Dionne Bucy, [place]   1 year ago Essential hypertension   [place], Vernia Buff, NP   1 year ago Essential hypertension   [place], Vernia Buff, NP   1 year ago Secondary hypertension   [place], [place], [place]

## 2020-07-13 ENCOUNTER — Other Ambulatory Visit: Payer: Self-pay | Admitting: Nurse Practitioner

## 2020-07-13 DIAGNOSIS — I10 Essential (primary) hypertension: Secondary | ICD-10-CM

## 2020-07-13 NOTE — Telephone Encounter (Signed)
Requested medications are due for refill today.  yes  Requested medications are on the active medications list.  yes  Last refill. 06/10/2020  Future visit scheduled.   no  Notes to clinic.  Patient more than 3 months overdue for office visit.

## 2020-07-13 NOTE — Telephone Encounter (Signed)
This is not a patient of [facility] Please route accordingly.

## 2020-07-14 ENCOUNTER — Other Ambulatory Visit: Payer: Self-pay

## 2020-07-14 ENCOUNTER — Other Ambulatory Visit: Payer: Self-pay | Admitting: Nurse Practitioner

## 2020-07-14 ENCOUNTER — Telehealth: Payer: Self-pay | Admitting: Nurse Practitioner

## 2020-07-14 DIAGNOSIS — I10 Essential (primary) hypertension: Secondary | ICD-10-CM

## 2020-07-14 NOTE — Telephone Encounter (Signed)
Requested medication (s) are due for refill today: yes  Requested medication (s) are on the active medication list: yes  Last refill:  06/10/20-06/10/21 #30 0 refills   Future visit scheduled: yes in 1 month  Notes to clinic:  last labs 11/13/19 . Do you want to refill Rx?     Requested Prescriptions  Pending Prescriptions Disp Refills   atenolol-chlorthalidone (TENORETIC) 50-25 MG tablet 30 tablet 0    Sig: TAKE 1 TABLET BY MOUTH DAILY.      Cardiovascular: Beta Blocker + Diuretic Combos Failed - 07/14/2020  3:36 PM      Failed - K in normal range and within 180 days    Potassium  Date Value Ref Range Status  11/13/2019 3.1 (L) 3.5 - 5.1 mmol/L Final          Failed - Na in normal range and within 180 days    Sodium  Date Value Ref Range Status  11/13/2019 141 135 - 145 mmol/L Final  09/16/2019 140 134 - 144 mmol/L Final          Failed - Cr in normal range and within 180 days    Creat  Date Value Ref Range Status  02/23/2014 0.89 0.50 - 1.35 mg/dL Final   Creatinine, Ser  Date Value Ref Range Status  11/13/2019 0.89 0.61 - 1.24 mg/dL Final   Creatinine,U  Date Value Ref Range Status  11/26/2014 171.66 mg/dL Final    Comment:       Cutoff Values for Urine Drug Screen, Pain Mgmt          Drug Class           Cutoff (ng/mL)          Amphetamines             500          Barbiturates             200          Cocaine Metabolites      150          Benzodiazepines          200          Methadone                300          Opiates                  300          Phencyclidine             25          Propoxyphene             300          Marijuana Metabolites     50    For medical purposes only.           Failed - Ca in normal range and within 180 days    Calcium  Date Value Ref Range Status  11/13/2019 9.0 8.9 - 10.3 mg/dL Final   Calcium, Ion  Date Value Ref Range Status  06/04/2009 1.12 1.12 - 1.32 mmol/L Final          Passed - Patient is not  pregnant      Passed - Last BP in normal range    BP Readings from Last 1 Encounters:  11/13/19 108/74  Passed - Last Heart Rate in normal range    Pulse Readings from Last 1 Encounters:  11/13/19 82          Passed - Valid encounter within last 6 months    Recent Outpatient Visits           1 month ago Gastroesophageal reflux disease, unspecified whether esophagitis present   [place] Kimber Relic, MD   10 months ago Hypokalemia   [place], Dionne Bucy, [place]   1 year ago Essential hypertension   [place], Vernia Buff, NP   1 year ago Essential hypertension   [place], Vernia Buff, NP   1 year ago Secondary hypertension   [place], Dionne Bucy, [place]       Future Appointments             In 1 month [place], Dionne Bucy, PA-C [place]

## 2020-07-14 NOTE — Telephone Encounter (Signed)
Pt would like a refill on   atenolol-chlorthalidone (TENORETIC) 50-25 MG tablet [[id number]  Valacyclovir HCL 1,000MG   Pt states he is out of medication. He has an upcoming appt on June 8th. Please follow up with pt regarding issue

## 2020-07-15 ENCOUNTER — Other Ambulatory Visit: Payer: Self-pay

## 2020-07-19 ENCOUNTER — Other Ambulatory Visit: Payer: Self-pay

## 2020-07-19 MED ORDER — ATENOLOL-CHLORTHALIDONE 50-25 MG PO TABS
ORAL_TABLET | ORAL | 0 refills | Status: DC
Start: 2020-07-19 — End: 2020-08-24
  Filled 2020-07-19: qty 30, 30d supply, fill #0

## 2020-07-19 MED ORDER — VALACYCLOVIR HCL 1 G PO TABS
1000.0000 mg | ORAL_TABLET | Freq: Every day | ORAL | 1 refills | Status: DC
Start: 2020-07-19 — End: 2020-08-24
  Filled 2020-07-19: qty 30, 30d supply, fill #0

## 2020-07-19 MED ORDER — PRAVASTATIN SODIUM 20 MG PO TABS
ORAL_TABLET | ORAL | 0 refills | Status: DC
Start: 2020-07-19 — End: 2020-08-24
  Filled 2020-07-19: qty 30, 30d supply, fill #0

## 2020-08-24 ENCOUNTER — Ambulatory Visit: Payer: 59 | Attending: Physician Assistant | Admitting: Physician Assistant

## 2020-08-24 ENCOUNTER — Other Ambulatory Visit: Payer: Self-pay

## 2020-08-24 ENCOUNTER — Other Ambulatory Visit: Payer: Self-pay | Admitting: Physician Assistant

## 2020-08-24 ENCOUNTER — Encounter: Payer: Self-pay | Admitting: Physician Assistant

## 2020-08-24 VITALS — BP 134/91 | HR 68 | Resp 16 | Wt 188.8 lb

## 2020-08-24 DIAGNOSIS — E785 Hyperlipidemia, unspecified: Secondary | ICD-10-CM | POA: Diagnosis not present

## 2020-08-24 DIAGNOSIS — I10 Essential (primary) hypertension: Secondary | ICD-10-CM

## 2020-08-24 DIAGNOSIS — H9193 Unspecified hearing loss, bilateral: Secondary | ICD-10-CM | POA: Diagnosis not present

## 2020-08-24 DIAGNOSIS — H9313 Tinnitus, bilateral: Secondary | ICD-10-CM

## 2020-08-24 DIAGNOSIS — E559 Vitamin D deficiency, unspecified: Secondary | ICD-10-CM

## 2020-08-24 DIAGNOSIS — K219 Gastro-esophageal reflux disease without esophagitis: Secondary | ICD-10-CM

## 2020-08-24 DIAGNOSIS — R7303 Prediabetes: Secondary | ICD-10-CM

## 2020-08-24 DIAGNOSIS — A6000 Herpesviral infection of urogenital system, unspecified: Secondary | ICD-10-CM

## 2020-08-24 MED ORDER — PRAVASTATIN SODIUM 20 MG PO TABS
ORAL_TABLET | ORAL | 1 refills | Status: DC
  Filled 2020-08-24: qty 90, 90d supply, fill #0
  Filled 2020-11-24: qty 90, 90d supply, fill #1

## 2020-08-24 MED ORDER — OMEPRAZOLE 20 MG PO CPDR
20.0000 mg | DELAYED_RELEASE_CAPSULE | Freq: Every day | ORAL | 1 refills | Status: DC
  Filled 2020-08-24: qty 90, 90d supply, fill #0
  Filled 2020-11-10: qty 90, 90d supply, fill #1

## 2020-08-24 MED ORDER — VALACYCLOVIR HCL 1 G PO TABS
1000.0000 mg | ORAL_TABLET | Freq: Every day | ORAL | 1 refills | Status: DC
  Filled 2020-08-24: qty 90, 90d supply, fill #0
  Filled 2020-12-23 – 2021-01-02 (×2): qty 90, 90d supply, fill #1

## 2020-08-24 MED ORDER — ATENOLOL-CHLORTHALIDONE 50-25 MG PO TABS
ORAL_TABLET | ORAL | 1 refills | Status: DC
  Filled 2020-08-24: qty 90, 90d supply, fill #0
  Filled 2020-11-24: qty 90, 90d supply, fill #1

## 2020-08-24 NOTE — Progress Notes (Signed)
Corey Hale, is a 55 y.o. male  OTL:[id number]  HRC:[id number]  DOB - Nov 14, 1965  Subjective:  Chief Complaint and HPI: Corey Hale is a 55 y.o. male here today for med RF.  He is continuing to have issues with acid reflux and feeling as though food sometimes gets stuck.  No blood in stool.  No fevers.   No constipation/diarrhea.  Never saw GI.    He is also continuing to have tinnitus.  He also c/o decreased hearing but says hearing aids did not help.  He is requesting to see ENT again.  He did not take any of his meds the last week or two bc completely out.  No CP/HA/SOB/dizziness.    ROS:   Constitutional:  No f/c, No night sweats, No unexplained weight loss. EENT:  No vision changes, No blurry vision, No new hearing changes. No mouth, throat, or ear problems.  Respiratory: No cough, No SOB Cardiac: No CP, no palpitations GI:  No abd pain, No N/V/D.  See above GU: No Urinary s/sx Musculoskeletal: No joint pain Neuro: No headache, no dizziness, no motor weakness.  Skin: No rash Endocrine:  No polydipsia. No polyuria.  Psych: Denies SI/HI  No problems updated.  ALLERGIES: Allergies  Allergen Reactions  . Pork-Derived Products     No pork products; pt is a Muslim    PAST MEDICAL HISTORY: Past Medical History:  Diagnosis Date  . Chronic back pain   . Chronic sinusitis    nasal polyps, turbinate hypertrophy, deviated septum  . Erectile dysfunction   . Goiter   . Herpes simplex   . Hypercholesteremia   . Hypertension   . Perirectal abscess   . Skin abscess   . Thyroid disease   . Tobacco dependence     MEDICATIONS AT HOME: Prior to Admission medications   Medication Sig Start Date End Date Taking? Authorizing Provider  omeprazole (PRILOSEC) 20 MG capsule Take 1 capsule (20 mg total) by mouth daily. For acid reflux 08/24/20  Yes Argentina Donovan, PA-C  aspirin 81 MG tablet Take 1 tablet (81 mg total) by mouth daily. 03/07/15   Tresa Garter, MD   atenolol-chlorthalidone (TENORETIC) 50-25 MG tablet TAKE 1 TABLET BY MOUTH DAILY. 06/10/20 06/10/21  Gildardo Pounds, NP  atenolol-chlorthalidone (TENORETIC) 50-25 MG tablet Take 1 tablet every day by oral route. 08/24/20   Argentina Donovan, PA-C  docusate sodium (COLACE) 50 MG capsule Take 50 mg by mouth daily as needed for mild constipation.    [provider]  fluticasone (FLONASE) 50 MCG/ACT nasal spray Place 2 sprays into both nostrils daily. Patient taking differently: Place 2 sprays into both nostrils daily as needed for allergies.  09/16/19   Argentina Donovan, PA-C  oxyCODONE (ROXICODONE) 15 MG immediate release tablet Take 15 mg by mouth 5 (five) times daily as needed for pain. 10/16/19   [provider]  pravastatin (PRAVACHOL) 20 MG tablet Take 1 tablet every day by oral route. 08/24/20   Argentina Donovan, PA-C  predniSONE (DELTASONE) 10 MG tablet Take 4 tabs by mouth once daily for 7 days then decrease by 1 tab every three days. Patient not taking: Reported on 08/24/2020 11/13/19   Melida Quitter, MD  valACYclovir (VALTREX) 1000 MG tablet TAKE 1 TABLET (1,000 MG TOTAL) BY MOUTH DAILY. 08/24/20   Argentina Donovan, PA-C     Objective:  EXAM:   Vitals:   08/24/20 1517  BP: (!) 134/91  Pulse: 68  Resp: 16  SpO2: 98%  Weight: 188 lb 12.8 oz (85.6 kg)    General appearance : A&OX3. NAD. Non-toxic-appearing HEENT: Atraumatic and Normocephalic.  PERRLA. EOM intact.  TM clear B.  There does appear to be a scar on the L TM from previous rupture-(he denies h/o tympanostomy tubes) Chest/Lungs:  Breathing-non-labored, Good air entry bilaterally, breath sounds normal without rales, rhonchi, or wheezing  CVS: S1 S2 regular, no murmurs, gallops, rubs  Abdomen: Bowel sounds present, Non tender and not distended with no gaurding, rigidity or rebound. Extremities: Bilateral Lower Ext shows no edema, both legs are warm to touch with = pulse throughout Neurology:  CN II-XII grossly  intact, Non focal.   Psych:  TP linear. J/I WNL. Normal speech. Appropriate eye contact and affect.  Skin:  No Rash  Data Review Lab Results  Component Value Date   HGBA1C 5.5 04/06/2019   HGBA1C 5.3 10/29/2017   HGBA1C 5.7 (H) 06/10/2012     Assessment & Plan   1. Essential hypertension -did not take meds in 2 weeks.  Resume meds - Comprehensive metabolic panel - CBC with Differential/Platelet - atenolol-chlorthalidone (TENORETIC) 50-25 MG tablet; Take 1 tablet every day by oral route.  Dispense: 90 tablet; Refill: 1  2. Dyslipidemia  - Comprehensive metabolic panel - Lipid panel - pravastatin (PRAVACHOL) 20 MG tablet; Take 1 tablet every day by oral route.  Dispense: 90 tablet; Refill: 1  3. Prediabetes I have had a lengthy discussion and provided education about insulin resistance and the intake of too much sugar/refined carbohydrates.  I have advised the patient to work at a goal of eliminating sugary drinks, candy, desserts, sweets, refined sugars, processed foods, and white carbohydrates.  The patient expresses understanding.  - Comprehensive metabolic panel - Hemoglobin A1c  4. Bilateral hearing loss, unspecified hearing loss type  - Ambulatory referral to ENT  5. Tinnitus of both ears - Ambulatory referral to ENT  6. Gastroesophageal reflux disease, unspecified whether esophagitis present - Comprehensive metabolic panel - omeprazole (PRILOSEC) 20 MG capsule; Take 1 capsule (20 mg total) by mouth daily. For acid reflux  Dispense: 90 capsule; Refill: 1 - Ambulatory referral to Gastroenterology  7. Genital herpes simplex, unspecified site - valACYclovir (VALTREX) 1000 MG tablet; TAKE 1 TABLET (1,000 MG TOTAL) BY MOUTH DAILY.  Dispense: 90 tablet; Refill: 1  8. Vitamin D deficiency -check vitamin D   Patient have been counseled extensively about nutrition and exercise  Return in about 6 months (around 02/23/2021) for PCP;  chronic conditions.  The patient was  given clear instructions to go to ER or return to medical center if symptoms don't improve, worsen or new problems develop. The patient verbalized understanding. The patient was told to call to get lab results if they haven't heard anything in the next week.     Freeman Caldron, PA-C [facility] and [facility], [place]   08/24/2020, 3:29 PMPatient ID: CUONG MOORMAN, male   DOB: [birth year], 55 y.o.   MRN: [id number]

## 2020-08-24 NOTE — Patient Instructions (Signed)

## 2020-08-25 ENCOUNTER — Other Ambulatory Visit: Payer: Self-pay

## 2020-08-25 LAB — COMPREHENSIVE METABOLIC PANEL
ALT: 8 IU/L (ref 0–44)
AST: 10 IU/L (ref 0–40)
Albumin/Globulin Ratio: 1.4 (ref 1.2–2.2)
Albumin: 4.3 g/dL (ref 3.8–4.9)
Alkaline Phosphatase: 78 IU/L (ref 44–121)
BUN/Creatinine Ratio: 11 (ref 9–20)
BUN: 10 mg/dL (ref 6–24)
Bilirubin Total: 0.2 mg/dL (ref 0.0–1.2)
CO2: 25 mmol/L (ref 20–29)
Calcium: 9.6 mg/dL (ref 8.7–10.2)
Chloride: 100 mmol/L (ref 96–106)
Creatinine, Ser: 0.89 mg/dL (ref 0.76–1.27)
Globulin, Total: 3 g/dL (ref 1.5–4.5)
Glucose: 88 mg/dL (ref 65–99)
Potassium: 4.5 mmol/L (ref 3.5–5.2)
Sodium: 141 mmol/L (ref 134–144)
Total Protein: 7.3 g/dL (ref 6.0–8.5)
eGFR: 101 mL/min/{1.73_m2} (ref >59)

## 2020-08-25 LAB — HEMOGLOBIN A1C
Est. average glucose Bld gHb Est-mCnc: 111 mg/dL
Hgb A1c MFr Bld: 5.5 % (ref 4.8–5.6)

## 2020-08-25 LAB — CBC WITH DIFFERENTIAL/PLATELET
Basophils Absolute: 0.1 10*3/uL (ref 0.0–0.2)
Basos: 1 %
EOS (ABSOLUTE): 0.2 10*3/uL (ref 0.0–0.4)
Eos: 3 %
Hematocrit: 41.1 % (ref 37.5–51.0)
Hemoglobin: 14 g/dL (ref 13.0–17.7)
Immature Grans (Abs): 0 10*3/uL (ref 0.0–0.1)
Immature Granulocytes: 0 %
Lymphocytes Absolute: 2.2 10*3/uL (ref 0.7–3.1)
Lymphs: 31 %
MCH: 28.9 pg (ref 26.6–33.0)
MCHC: 34.1 g/dL (ref 31.5–35.7)
MCV: 85 fL (ref 79–97)
Monocytes Absolute: 0.5 10*3/uL (ref 0.1–0.9)
Monocytes: 7 %
Neutrophils Absolute: 4.3 10*3/uL (ref 1.4–7.0)
Neutrophils: 58 %
Platelets: 229 10*3/uL (ref 150–450)
RBC: 4.84 x10E6/uL (ref 4.14–5.80)
RDW: 12.8 % (ref 11.6–15.4)
WBC: 7.3 10*3/uL (ref 3.4–10.8)

## 2020-08-25 LAB — LIPID PANEL
Chol/HDL Ratio: 3.7 ratio (ref 0.0–5.0)
Cholesterol, Total: 164 mg/dL (ref 100–199)
HDL: 44 mg/dL (ref >39)
LDL Chol Calc (NIH): 103 mg/dL — ABNORMAL HIGH (ref 0–99)
Triglycerides: 89 mg/dL (ref 0–149)
VLDL Cholesterol Cal: 17 mg/dL (ref 5–40)

## 2020-08-25 LAB — VITAMIN D 25 HYDROXY (VIT D DEFICIENCY, FRACTURES): Vit D, 25-Hydroxy: 52.6 ng/mL (ref 30.0–100.0)

## 2020-08-26 ENCOUNTER — Other Ambulatory Visit: Payer: Self-pay

## 2020-08-29 ENCOUNTER — Ambulatory Visit: Payer: Self-pay | Admitting: *Deleted

## 2020-08-29 NOTE — Telephone Encounter (Signed)
Pt called in and was given lab result message from Corey Hale dated 08/25/2020 at 8:57 AM.  No questions from pt.  He verbalized understanding to take OTC vitamin D 2000 IU daily.

## 2020-09-12 ENCOUNTER — Other Ambulatory Visit (HOSPITAL_COMMUNITY): Payer: Self-pay

## 2020-09-13 ENCOUNTER — Other Ambulatory Visit (HOSPITAL_COMMUNITY): Payer: Self-pay

## 2020-09-13 MED ORDER — OXYCODONE HCL 20 MG PO TABS
ORAL_TABLET | ORAL | 0 refills | Status: DC
Start: 2020-09-12 — End: 2020-10-10
  Filled 2020-09-13 – 2020-09-14 (×2): qty 150, 30d supply, fill #0

## 2020-09-14 ENCOUNTER — Other Ambulatory Visit (HOSPITAL_COMMUNITY): Payer: Self-pay

## 2020-10-10 ENCOUNTER — Other Ambulatory Visit (HOSPITAL_COMMUNITY): Payer: Self-pay

## 2020-10-10 MED ORDER — OXYCODONE HCL 20 MG PO TABS
ORAL_TABLET | ORAL | 0 refills | Status: DC
  Filled 2020-10-10 – 2020-10-14 (×2): qty 150, 30d supply, fill #0

## 2020-10-11 ENCOUNTER — Other Ambulatory Visit (HOSPITAL_COMMUNITY): Payer: Self-pay

## 2020-10-12 ENCOUNTER — Other Ambulatory Visit (HOSPITAL_COMMUNITY): Payer: Self-pay

## 2020-10-14 ENCOUNTER — Other Ambulatory Visit (HOSPITAL_COMMUNITY): Payer: Self-pay

## 2020-11-09 ENCOUNTER — Other Ambulatory Visit (HOSPITAL_COMMUNITY): Payer: Self-pay

## 2020-11-09 MED ORDER — OXYCODONE HCL 20 MG PO TABS
ORAL_TABLET | ORAL | 0 refills | Status: DC
Start: 2020-11-09 — End: 2020-12-13
  Filled 2020-11-09 – 2020-11-11 (×2): qty 150, 30d supply, fill #0

## 2020-11-11 ENCOUNTER — Other Ambulatory Visit (HOSPITAL_COMMUNITY): Payer: Self-pay

## 2020-11-11 ENCOUNTER — Other Ambulatory Visit: Payer: Self-pay

## 2020-11-24 ENCOUNTER — Other Ambulatory Visit: Payer: Self-pay

## 2020-11-25 ENCOUNTER — Other Ambulatory Visit: Payer: Self-pay

## 2020-12-13 ENCOUNTER — Other Ambulatory Visit (HOSPITAL_COMMUNITY): Payer: Self-pay

## 2020-12-13 MED ORDER — OXYCODONE HCL 20 MG PO TABS
ORAL_TABLET | ORAL | 0 refills | Status: DC
Start: 2020-12-13 — End: 2021-01-05
  Filled 2020-12-13: qty 150, 30d supply, fill #0

## 2020-12-23 ENCOUNTER — Other Ambulatory Visit: Payer: Self-pay

## 2020-12-30 ENCOUNTER — Other Ambulatory Visit: Payer: Self-pay

## 2021-01-02 ENCOUNTER — Other Ambulatory Visit: Payer: Self-pay

## 2021-01-04 ENCOUNTER — Other Ambulatory Visit: Payer: Self-pay

## 2021-01-05 ENCOUNTER — Other Ambulatory Visit (HOSPITAL_COMMUNITY): Payer: Self-pay

## 2021-01-05 MED ORDER — OXYCODONE HCL 20 MG PO TABS
ORAL_TABLET | ORAL | 0 refills | Status: DC
Start: 2021-01-05 — End: 2021-02-06
  Filled 2021-01-12: qty 150, 30d supply, fill #0

## 2021-01-10 ENCOUNTER — Other Ambulatory Visit (HOSPITAL_COMMUNITY): Payer: Self-pay

## 2021-01-12 ENCOUNTER — Other Ambulatory Visit (HOSPITAL_COMMUNITY): Payer: Self-pay

## 2021-02-06 ENCOUNTER — Other Ambulatory Visit (HOSPITAL_COMMUNITY): Payer: Self-pay

## 2021-02-06 DIAGNOSIS — Z79899 Other long term (current) drug therapy: Secondary | ICD-10-CM | POA: Diagnosis not present

## 2021-02-06 MED ORDER — OXYCODONE HCL 20 MG PO TABS
20.0000 mg | ORAL_TABLET | ORAL | 0 refills | Status: DC
Start: 2021-02-06 — End: 2021-09-26
  Filled 2021-02-11: qty 150, 30d supply, fill #0

## 2021-02-08 ENCOUNTER — Other Ambulatory Visit (HOSPITAL_COMMUNITY): Payer: Self-pay

## 2021-02-08 DIAGNOSIS — Z79899 Other long term (current) drug therapy: Secondary | ICD-10-CM | POA: Diagnosis not present

## 2021-02-11 ENCOUNTER — Other Ambulatory Visit (HOSPITAL_BASED_OUTPATIENT_CLINIC_OR_DEPARTMENT_OTHER): Payer: Self-pay

## 2021-02-11 ENCOUNTER — Other Ambulatory Visit (HOSPITAL_COMMUNITY): Payer: Self-pay

## 2021-02-15 ENCOUNTER — Ambulatory Visit: Payer: 59 | Admitting: Physician Assistant

## 2021-02-21 ENCOUNTER — Telehealth: Payer: Self-pay | Admitting: Nurse Practitioner

## 2021-02-21 NOTE — Telephone Encounter (Signed)
Provider Levada Dy out 12/8. Called patient & left vm to call [phone] to reschedule appt.

## 2021-02-22 ENCOUNTER — Other Ambulatory Visit: Payer: Self-pay | Admitting: Nurse Practitioner

## 2021-02-22 DIAGNOSIS — I10 Essential (primary) hypertension: Secondary | ICD-10-CM

## 2021-02-22 MED ORDER — ATENOLOL-CHLORTHALIDONE 50-25 MG PO TABS
1.0000 | ORAL_TABLET | Freq: Every day | ORAL | 0 refills | Status: DC
  Filled 2021-02-22: qty 30, 30d supply, fill #0

## 2021-02-22 NOTE — Telephone Encounter (Signed)
BP med has been sent

## 2021-02-22 NOTE — Telephone Encounter (Signed)
Pt returned office call to reschedule his apt. Pt says that he will be out of his bp medication soon. Pt would like to know if provider is able to provide him with a refill until his scheduled appt?    Pharmacy: [facility] and [place] Phone:  [phone]  Fax:  [phone]

## 2021-02-23 ENCOUNTER — Other Ambulatory Visit: Payer: Self-pay

## 2021-02-23 ENCOUNTER — Ambulatory Visit: Payer: 59 | Admitting: Physician Assistant

## 2021-02-23 NOTE — Telephone Encounter (Signed)
Left message to return call to our office.  

## 2021-02-24 ENCOUNTER — Other Ambulatory Visit: Payer: Self-pay

## 2021-02-28 ENCOUNTER — Other Ambulatory Visit: Payer: Self-pay

## 2021-03-08 ENCOUNTER — Other Ambulatory Visit (HOSPITAL_COMMUNITY): Payer: Self-pay

## 2021-03-08 DIAGNOSIS — Z79899 Other long term (current) drug therapy: Secondary | ICD-10-CM | POA: Diagnosis not present

## 2021-03-08 MED ORDER — OXYCODONE HCL 20 MG PO TABS
ORAL_TABLET | ORAL | 0 refills | Status: DC
Start: 2021-03-08 — End: 2021-09-26
  Filled 2021-03-08: qty 128, 26d supply, fill #0

## 2021-03-22 ENCOUNTER — Encounter: Payer: Self-pay | Admitting: Physician Assistant

## 2021-03-22 ENCOUNTER — Ambulatory Visit: Payer: 59 | Attending: Physician Assistant | Admitting: Physician Assistant

## 2021-03-22 ENCOUNTER — Other Ambulatory Visit: Payer: Self-pay

## 2021-03-22 VITALS — BP 129/84 | HR 55 | Resp 16 | Wt 195.8 lb

## 2021-03-22 DIAGNOSIS — I10 Essential (primary) hypertension: Secondary | ICD-10-CM | POA: Diagnosis not present

## 2021-03-22 DIAGNOSIS — K219 Gastro-esophageal reflux disease without esophagitis: Secondary | ICD-10-CM

## 2021-03-22 DIAGNOSIS — A6000 Herpesviral infection of urogenital system, unspecified: Secondary | ICD-10-CM | POA: Diagnosis not present

## 2021-03-22 DIAGNOSIS — E785 Hyperlipidemia, unspecified: Secondary | ICD-10-CM | POA: Diagnosis not present

## 2021-03-22 DIAGNOSIS — R7303 Prediabetes: Secondary | ICD-10-CM | POA: Diagnosis not present

## 2021-03-22 MED ORDER — PRAVASTATIN SODIUM 20 MG PO TABS
ORAL_TABLET | ORAL | 1 refills | Status: DC
  Filled 2021-03-22: qty 90, fill #0
  Filled 2021-03-22 – 2021-03-27 (×2): qty 90, 90d supply, fill #0
  Filled 2021-06-23: qty 90, 90d supply, fill #1

## 2021-03-22 MED ORDER — VALACYCLOVIR HCL 1 G PO TABS
1000.0000 mg | ORAL_TABLET | Freq: Every day | ORAL | 1 refills | Status: DC
  Filled 2021-03-22 (×2): qty 90, 90d supply, fill #0

## 2021-03-22 MED ORDER — ATENOLOL-CHLORTHALIDONE 50-25 MG PO TABS
1.0000 | ORAL_TABLET | Freq: Every day | ORAL | 1 refills | Status: DC
  Filled 2021-03-22 – 2021-03-27 (×3): qty 90, 90d supply, fill #0
  Filled 2021-06-23: qty 90, 90d supply, fill #1

## 2021-03-22 MED ORDER — OMEPRAZOLE 20 MG PO CPDR
20.0000 mg | DELAYED_RELEASE_CAPSULE | Freq: Every day | ORAL | 1 refills | Status: DC
Start: 2021-03-22 — End: 2021-09-26
  Filled 2021-03-22 – 2021-03-27 (×3): qty 90, 90d supply, fill #0
  Filled 2021-06-23: qty 90, 90d supply, fill #1

## 2021-03-22 NOTE — Progress Notes (Signed)
Patient ID: Corey Hale, male   DOB: 01/16/1966, 56 y.o.   MRN: [id number]   Jiovanny Burdell, is a 56 y.o. male  MVH:[id number]  [id number]:[id number]  DOB - [birth year]  Chief Complaint  Patient presents with   Medication Refill       Subjective:   Corey Hale is a 56 y.o. male here today for med RF.  No issues or concerns.  Patient has No headache, No chest pain, No abdominal pain - No Nausea, No new weakness tingling or numbness, No Cough - SOB.  No problems updated.  ALLERGIES: Allergies  Allergen Reactions   Pork-Derived Products     No pork products; pt is a Muslim    PAST MEDICAL HISTORY: Past Medical History:  Diagnosis Date   Chronic back pain    Chronic sinusitis    nasal polyps, turbinate hypertrophy, deviated septum   Erectile dysfunction    Goiter    Herpes simplex    Hypercholesteremia    Hypertension    Perirectal abscess    Skin abscess    Thyroid disease    Tobacco dependence     MEDICATIONS AT HOME: Prior to Admission medications   Medication Sig Start Date End Date Taking? Authorizing Provider  aspirin 81 MG tablet Take 1 tablet (81 mg total) by mouth daily. 03/07/15  Yes Tresa Garter, MD  docusate sodium (COLACE) 50 MG capsule Take 50 mg by mouth daily as needed for mild constipation.   Yes [provider]  fluticasone (FLONASE) 50 MCG/ACT nasal spray Place 2 sprays into both nostrils daily. Patient taking differently: Place 2 sprays into both nostrils daily as needed for allergies. 09/16/19  Yes Argentina Donovan, PA-C  oxyCODONE (ROXICODONE) 15 MG immediate release tablet Take 15 mg by mouth 5 (five) times daily as needed for pain. 10/16/19  Yes [provider]  Oxycodone HCl 20 MG TABS Take 1 tablet (20 mg total) by mouth 5 times a day as needed (fill 02/11/21) 02/06/21  Yes   Oxycodone HCl 20 MG TABS Take 1 tablet by mouth five times daily as needed 03/08/21  Yes   atenolol-chlorthalidone (TENORETIC) 50-25 MG tablet Take 1  tablet by mouth daily. 03/22/21 06/20/21  Argentina Donovan, PA-C  omeprazole (PRILOSEC) 20 MG capsule Take 1 capsule (20 mg total) by mouth daily. For acid reflux 03/22/21   Argentina Donovan, PA-C  pravastatin (PRAVACHOL) 20 MG tablet Take 1 tablet every day by oral route. 03/22/21   Argentina Donovan, PA-C  valACYclovir (VALTREX) 1000 MG tablet TAKE 1 TABLET (1,000 MG TOTAL) BY MOUTH DAILY. 03/22/21   Matthew Cina, Dionne Bucy, PA-C    ROS: Neg HEENT Neg resp Neg cardiac Neg GI Neg GU Neg MS Neg psych Neg neuro  Objective:   Vitals:   03/22/21 1611  BP: 129/84  Pulse: (!) 55  Resp: 16  SpO2: 98%  Weight: 195 lb 12.8 oz (88.8 kg)   Exam General appearance : Awake, alert, not in any distress. Speech Clear. Not toxic looking HEENT: Atraumatic and Normocephalic Neck: Supple, no JVD. No cervical lymphadenopathy.  Chest: Good air entry bilaterally, CTAB.  No rales/rhonchi/wheezing CVS: S1 S2 regular, no murmurs.  Extremities: B/L Lower Ext shows no edema, both legs are warm to touch Neurology: Awake alert, and oriented X 3, CN II-XII intact, Non focal Skin: No Rash  Data Review Lab Results  Component Value Date   HGBA1C 5.5 08/24/2020   HGBA1C 5.5 04/06/2019   HGBA1C 5.3  10/29/2017    Assessment & Plan   1. Essential hypertension Controlled-continue current regimen - Comprehensive metabolic panel - CBC with Differential/Platelet - atenolol-chlorthalidone (TENORETIC) 50-25 MG tablet; Take 1 tablet by mouth daily.  Dispense: 90 tablet; Refill: 1  2. Prediabetes I have had a lengthy discussion and provided education about insulin resistance and the intake of too much sugar/refined carbohydrates.  I have advised the patient to work at a goal of eliminating sugary drinks, candy, desserts, sweets, refined sugars, processed foods, and white carbohydrates.  The patient expresses understanding.   - Hemoglobin A1c  3. Dyslipidemia - Lipid panel - CBC with Differential/Platelet -  pravastatin (PRAVACHOL) 20 MG tablet; Take 1 tablet every day by oral route.  Dispense: 90 tablet; Refill: 1  4. Genital herpes simplex, unspecified site - valACYclovir (VALTREX) 1000 MG tablet; TAKE 1 TABLET (1,000 MG TOTAL) BY MOUTH DAILY.  Dispense: 90 tablet; Refill: 1  5. Gastroesophageal reflux disease, unspecified whether esophagitis present - omeprazole (PRILOSEC) 20 MG capsule; Take 1 capsule (20 mg total) by mouth daily. For acid reflux  Dispense: 90 capsule; Refill: 1    Patient have been counseled extensively about nutrition and exercise. Other issues discussed during this visit include: low cholesterol diet, weight control and daily exercise, foot care, annual eye examinations at Ophthalmology, importance of adherence with medications and regular follow-up. We also discussed long term complications of uncontrolled diabetes and hypertension.   Return in about 6 months (around 09/19/2021) for PCP-chronic conditions.  The patient was given clear instructions to go to ER or return to medical center if symptoms don't improve, worsen or new problems develop. The patient verbalized understanding. The patient was told to call to get lab results if they haven't heard anything in the next week.      Freeman Caldron, PA-C [facility] and [place], [place]   03/22/2021, 4:28 PM

## 2021-03-23 ENCOUNTER — Other Ambulatory Visit: Payer: Self-pay

## 2021-03-23 LAB — COMPREHENSIVE METABOLIC PANEL
ALT: 10 IU/L (ref 0–44)
AST: 16 IU/L (ref 0–40)
Albumin/Globulin Ratio: 1.4 (ref 1.2–2.2)
Albumin: 4.4 g/dL (ref 3.8–4.9)
Alkaline Phosphatase: 78 IU/L (ref 44–121)
BUN/Creatinine Ratio: 11 (ref 9–20)
BUN: 10 mg/dL (ref 6–24)
Bilirubin Total: 0.4 mg/dL (ref 0.0–1.2)
CO2: 30 mmol/L — ABNORMAL HIGH (ref 20–29)
Calcium: 9.4 mg/dL (ref 8.7–10.2)
Chloride: 95 mmol/L — ABNORMAL LOW (ref 96–106)
Creatinine, Ser: 0.9 mg/dL (ref 0.76–1.27)
Globulin, Total: 3.1 g/dL (ref 1.5–4.5)
Glucose: 82 mg/dL (ref 70–99)
Potassium: 4 mmol/L (ref 3.5–5.2)
Sodium: 136 mmol/L (ref 134–144)
Total Protein: 7.5 g/dL (ref 6.0–8.5)
eGFR: 101 mL/min/{1.73_m2} (ref >59)

## 2021-03-23 LAB — LIPID PANEL
Chol/HDL Ratio: 4.1 ratio (ref 0.0–5.0)
Cholesterol, Total: 167 mg/dL (ref 100–199)
HDL: 41 mg/dL (ref >39)
LDL Chol Calc (NIH): 107 mg/dL — ABNORMAL HIGH (ref 0–99)
Triglycerides: 103 mg/dL (ref 0–149)
VLDL Cholesterol Cal: 19 mg/dL (ref 5–40)

## 2021-03-23 LAB — CBC WITH DIFFERENTIAL/PLATELET
Basophils Absolute: 0.1 10*3/uL (ref 0.0–0.2)
Basos: 1 %
EOS (ABSOLUTE): 0.4 10*3/uL (ref 0.0–0.4)
Eos: 5 %
Hematocrit: 42 % (ref 37.5–51.0)
Hemoglobin: 13.9 g/dL (ref 13.0–17.7)
Immature Grans (Abs): 0 10*3/uL (ref 0.0–0.1)
Immature Granulocytes: 0 %
Lymphocytes Absolute: 2.4 10*3/uL (ref 0.7–3.1)
Lymphs: 34 %
MCH: 28.1 pg (ref 26.6–33.0)
MCHC: 33.1 g/dL (ref 31.5–35.7)
MCV: 85 fL (ref 79–97)
Monocytes Absolute: 0.6 10*3/uL (ref 0.1–0.9)
Monocytes: 8 %
Neutrophils Absolute: 3.7 10*3/uL (ref 1.4–7.0)
Neutrophils: 52 %
Platelets: 208 10*3/uL (ref 150–450)
RBC: 4.95 x10E6/uL (ref 4.14–5.80)
RDW: 12.7 % (ref 11.6–15.4)
WBC: 7.2 10*3/uL (ref 3.4–10.8)

## 2021-03-23 LAB — HEMOGLOBIN A1C
Est. average glucose Bld gHb Est-mCnc: 117 mg/dL
Hgb A1c MFr Bld: 5.7 % — ABNORMAL HIGH (ref 4.8–5.6)

## 2021-03-27 ENCOUNTER — Other Ambulatory Visit: Payer: Self-pay

## 2021-03-28 ENCOUNTER — Other Ambulatory Visit: Payer: Self-pay

## 2021-04-06 ENCOUNTER — Other Ambulatory Visit (HOSPITAL_COMMUNITY): Payer: Self-pay

## 2021-04-06 DIAGNOSIS — R030 Elevated blood-pressure reading, without diagnosis of hypertension: Secondary | ICD-10-CM | POA: Diagnosis not present

## 2021-04-06 DIAGNOSIS — M545 Low back pain, unspecified: Secondary | ICD-10-CM | POA: Diagnosis not present

## 2021-04-06 DIAGNOSIS — M546 Pain in thoracic spine: Secondary | ICD-10-CM | POA: Diagnosis not present

## 2021-04-06 DIAGNOSIS — Z79899 Other long term (current) drug therapy: Secondary | ICD-10-CM | POA: Diagnosis not present

## 2021-04-06 DIAGNOSIS — M542 Cervicalgia: Secondary | ICD-10-CM | POA: Diagnosis not present

## 2021-04-06 MED ORDER — OXYCODONE HCL 20 MG PO TABS
ORAL_TABLET | ORAL | 0 refills | Status: DC
  Filled 2021-04-06: qty 145, 30d supply, fill #0

## 2021-04-10 DIAGNOSIS — Z79899 Other long term (current) drug therapy: Secondary | ICD-10-CM | POA: Diagnosis not present

## 2021-05-04 ENCOUNTER — Other Ambulatory Visit (HOSPITAL_COMMUNITY): Payer: Self-pay

## 2021-05-04 DIAGNOSIS — M546 Pain in thoracic spine: Secondary | ICD-10-CM | POA: Diagnosis not present

## 2021-05-04 DIAGNOSIS — Z6826 Body mass index (BMI) 26.0-26.9, adult: Secondary | ICD-10-CM | POA: Diagnosis not present

## 2021-05-04 DIAGNOSIS — Z79899 Other long term (current) drug therapy: Secondary | ICD-10-CM | POA: Diagnosis not present

## 2021-05-04 DIAGNOSIS — R030 Elevated blood-pressure reading, without diagnosis of hypertension: Secondary | ICD-10-CM | POA: Diagnosis not present

## 2021-05-04 MED ORDER — OXYCODONE HCL 20 MG PO TABS
ORAL_TABLET | ORAL | 0 refills | Status: DC
  Filled 2021-05-04: qty 140, 28d supply, fill #0

## 2021-05-08 DIAGNOSIS — Z79899 Other long term (current) drug therapy: Secondary | ICD-10-CM | POA: Diagnosis not present

## 2021-06-01 ENCOUNTER — Other Ambulatory Visit (HOSPITAL_COMMUNITY): Payer: Self-pay

## 2021-06-01 DIAGNOSIS — M129 Arthropathy, unspecified: Secondary | ICD-10-CM | POA: Diagnosis not present

## 2021-06-01 DIAGNOSIS — M546 Pain in thoracic spine: Secondary | ICD-10-CM | POA: Diagnosis not present

## 2021-06-01 DIAGNOSIS — Z131 Encounter for screening for diabetes mellitus: Secondary | ICD-10-CM | POA: Diagnosis not present

## 2021-06-01 DIAGNOSIS — Z125 Encounter for screening for malignant neoplasm of prostate: Secondary | ICD-10-CM | POA: Diagnosis not present

## 2021-06-01 DIAGNOSIS — R5383 Other fatigue: Secondary | ICD-10-CM | POA: Diagnosis not present

## 2021-06-01 DIAGNOSIS — Z79899 Other long term (current) drug therapy: Secondary | ICD-10-CM | POA: Diagnosis not present

## 2021-06-01 DIAGNOSIS — E7800 Pure hypercholesterolemia, unspecified: Secondary | ICD-10-CM | POA: Diagnosis not present

## 2021-06-01 DIAGNOSIS — E559 Vitamin D deficiency, unspecified: Secondary | ICD-10-CM | POA: Diagnosis not present

## 2021-06-01 MED ORDER — OXYCODONE HCL 20 MG PO TABS
ORAL_TABLET | ORAL | 0 refills | Status: DC
  Filled 2021-06-01: qty 140, 28d supply, fill #0

## 2021-06-06 DIAGNOSIS — Z79899 Other long term (current) drug therapy: Secondary | ICD-10-CM | POA: Diagnosis not present

## 2021-06-23 ENCOUNTER — Other Ambulatory Visit: Payer: Self-pay

## 2021-06-29 ENCOUNTER — Other Ambulatory Visit: Payer: Self-pay

## 2021-06-30 ENCOUNTER — Other Ambulatory Visit (HOSPITAL_COMMUNITY): Payer: Self-pay

## 2021-06-30 DIAGNOSIS — R030 Elevated blood-pressure reading, without diagnosis of hypertension: Secondary | ICD-10-CM | POA: Diagnosis not present

## 2021-06-30 DIAGNOSIS — Z79899 Other long term (current) drug therapy: Secondary | ICD-10-CM | POA: Diagnosis not present

## 2021-06-30 DIAGNOSIS — Z6826 Body mass index (BMI) 26.0-26.9, adult: Secondary | ICD-10-CM | POA: Diagnosis not present

## 2021-06-30 DIAGNOSIS — M546 Pain in thoracic spine: Secondary | ICD-10-CM | POA: Diagnosis not present

## 2021-06-30 MED ORDER — OXYCODONE HCL 20 MG PO TABS
ORAL_TABLET | ORAL | 0 refills | Status: DC
  Filled 2021-06-30: qty 140, 28d supply, fill #0

## 2021-07-04 DIAGNOSIS — Z79899 Other long term (current) drug therapy: Secondary | ICD-10-CM | POA: Diagnosis not present

## 2021-07-28 ENCOUNTER — Other Ambulatory Visit (HOSPITAL_COMMUNITY): Payer: Self-pay

## 2021-07-28 DIAGNOSIS — Z79899 Other long term (current) drug therapy: Secondary | ICD-10-CM | POA: Diagnosis not present

## 2021-07-28 DIAGNOSIS — R030 Elevated blood-pressure reading, without diagnosis of hypertension: Secondary | ICD-10-CM | POA: Diagnosis not present

## 2021-07-28 DIAGNOSIS — M546 Pain in thoracic spine: Secondary | ICD-10-CM | POA: Diagnosis not present

## 2021-07-28 DIAGNOSIS — Z6826 Body mass index (BMI) 26.0-26.9, adult: Secondary | ICD-10-CM | POA: Diagnosis not present

## 2021-08-25 ENCOUNTER — Other Ambulatory Visit (HOSPITAL_COMMUNITY): Payer: Self-pay

## 2021-08-25 DIAGNOSIS — Z1211 Encounter for screening for malignant neoplasm of colon: Secondary | ICD-10-CM | POA: Diagnosis not present

## 2021-08-25 DIAGNOSIS — R030 Elevated blood-pressure reading, without diagnosis of hypertension: Secondary | ICD-10-CM | POA: Diagnosis not present

## 2021-08-25 DIAGNOSIS — Z79899 Other long term (current) drug therapy: Secondary | ICD-10-CM | POA: Diagnosis not present

## 2021-08-25 DIAGNOSIS — M546 Pain in thoracic spine: Secondary | ICD-10-CM | POA: Diagnosis not present

## 2021-08-25 MED ORDER — OXYCODONE HCL 20 MG PO TABS
ORAL_TABLET | ORAL | 0 refills | Status: DC
  Filled 2021-08-25: qty 140, 28d supply, fill #0

## 2021-08-30 DIAGNOSIS — Z79899 Other long term (current) drug therapy: Secondary | ICD-10-CM | POA: Diagnosis not present

## 2021-09-22 ENCOUNTER — Other Ambulatory Visit (HOSPITAL_COMMUNITY): Payer: Self-pay

## 2021-09-22 DIAGNOSIS — M546 Pain in thoracic spine: Secondary | ICD-10-CM | POA: Diagnosis not present

## 2021-09-22 DIAGNOSIS — Z79899 Other long term (current) drug therapy: Secondary | ICD-10-CM | POA: Diagnosis not present

## 2021-09-22 DIAGNOSIS — R030 Elevated blood-pressure reading, without diagnosis of hypertension: Secondary | ICD-10-CM | POA: Diagnosis not present

## 2021-09-22 DIAGNOSIS — Z6826 Body mass index (BMI) 26.0-26.9, adult: Secondary | ICD-10-CM | POA: Diagnosis not present

## 2021-09-22 MED ORDER — OXYCODONE HCL 20 MG PO TABS
ORAL_TABLET | ORAL | 0 refills | Status: DC
  Filled 2021-09-22: qty 140, 30d supply, fill #0

## 2021-09-26 ENCOUNTER — Encounter: Payer: Self-pay | Admitting: Critical Care Medicine

## 2021-09-26 ENCOUNTER — Ambulatory Visit: Payer: 59 | Attending: Critical Care Medicine | Admitting: Critical Care Medicine

## 2021-09-26 ENCOUNTER — Other Ambulatory Visit: Payer: Self-pay

## 2021-09-26 VITALS — BP 135/88 | HR 64 | Wt 196.0 lb

## 2021-09-26 DIAGNOSIS — A6000 Herpesviral infection of urogenital system, unspecified: Secondary | ICD-10-CM

## 2021-09-26 DIAGNOSIS — G8929 Other chronic pain: Secondary | ICD-10-CM | POA: Diagnosis not present

## 2021-09-26 DIAGNOSIS — R7303 Prediabetes: Secondary | ICD-10-CM | POA: Insufficient documentation

## 2021-09-26 DIAGNOSIS — H6123 Impacted cerumen, bilateral: Secondary | ICD-10-CM

## 2021-09-26 DIAGNOSIS — K029 Dental caries, unspecified: Secondary | ICD-10-CM

## 2021-09-26 DIAGNOSIS — E785 Hyperlipidemia, unspecified: Secondary | ICD-10-CM | POA: Diagnosis not present

## 2021-09-26 DIAGNOSIS — H9193 Unspecified hearing loss, bilateral: Secondary | ICD-10-CM | POA: Diagnosis not present

## 2021-09-26 DIAGNOSIS — M546 Pain in thoracic spine: Secondary | ICD-10-CM | POA: Diagnosis not present

## 2021-09-26 DIAGNOSIS — H903 Sensorineural hearing loss, bilateral: Secondary | ICD-10-CM

## 2021-09-26 DIAGNOSIS — I10 Essential (primary) hypertension: Secondary | ICD-10-CM

## 2021-09-26 DIAGNOSIS — Z720 Tobacco use: Secondary | ICD-10-CM

## 2021-09-26 MED ORDER — ATENOLOL-CHLORTHALIDONE 50-25 MG PO TABS
1.0000 | ORAL_TABLET | Freq: Every day | ORAL | 2 refills | Status: DC
  Filled 2021-09-26: qty 30, 30d supply, fill #0
  Filled 2021-10-28 – 2021-10-30 (×2): qty 30, 30d supply, fill #1
  Filled 2021-11-29: qty 30, 30d supply, fill #2
  Filled 2022-01-02: qty 30, 30d supply, fill #3
  Filled 2022-01-31: qty 30, 30d supply, fill #4
  Filled 2022-03-05: qty 30, 30d supply, fill #5
  Filled 2022-03-05: qty 90, 90d supply, fill #5

## 2021-09-26 MED ORDER — VALACYCLOVIR HCL 1 G PO TABS
1000.0000 mg | ORAL_TABLET | Freq: Every day | ORAL | 1 refills | Status: DC
  Filled 2021-09-26: qty 30, 30d supply, fill #0
  Filled 2022-01-02: qty 30, 30d supply, fill #1
  Filled 2022-02-21: qty 30, 30d supply, fill #2
  Filled 2022-04-15: qty 30, 30d supply, fill #3

## 2021-09-26 MED ORDER — DEBROX 6.5 % OT SOLN
5.0000 [drp] | Freq: Two times a day (BID) | OTIC | 0 refills | Status: DC
  Filled 2021-09-26: qty 15, 30d supply, fill #0

## 2021-09-26 MED ORDER — NALOXONE HCL 4 MG/0.1ML NA LIQD
NASAL | 1 refills | Status: DC
  Filled 2021-09-26: qty 2, 1d supply, fill #0

## 2021-09-26 MED ORDER — PRAVASTATIN SODIUM 20 MG PO TABS
ORAL_TABLET | ORAL | 1 refills | Status: DC
  Filled 2021-09-26: qty 90, 90d supply, fill #0
  Filled 2022-01-31: qty 90, 90d supply, fill #1

## 2021-09-26 NOTE — Assessment & Plan Note (Signed)
Recommended regular dental exams

## 2021-09-26 NOTE — Progress Notes (Signed)
Established Patient Office Visit  Subjective   Patient ID: Corey Hale, male    DOB: [birth year]  Age: 56 y.o. MRN: [id number]  Chief Complaint  Patient presents with   Medication Refill    56 y.o. M PCP Raul Del  Patient is seen today for medication refills and follow-up.  Patient last seen in January by PA Mcclung as noted below  Last seen 03/2021 by [facility]: 1. Essential hypertension Controlled-continue current regimen - Comprehensive metabolic panel - CBC with Differential/Platelet - atenolol-chlorthalidone (TENORETIC) 50-25 MG tablet; Take 1 tablet by mouth daily.  Dispense: 90 tablet; Refill: 1   2. Prediabetes I have had a lengthy discussion and provided education about insulin resistance and the intake of too much sugar/refined carbohydrates.  I have advised the patient to work at a goal of eliminating sugary drinks, candy, desserts, sweets, refined sugars, processed foods, and white carbohydrates.  The patient expresses understanding.    - Hemoglobin A1c   3. Dyslipidemia - Lipid panel - CBC with Differential/Platelet - pravastatin (PRAVACHOL) 20 MG tablet; Take 1 tablet every day by oral route.  Dispense: 90 tablet; Refill: 1   4. Genital herpes simplex, unspecified site - valACYclovir (VALTREX) 1000 MG tablet; TAKE 1 TABLET (1,000 MG TOTAL) BY MOUTH DAILY.  Dispense: 90 tablet; Refill: 1   5. Gastroesophageal reflux disease, unspecified whether esophagitis present - omeprazole (PRILOSEC) 20 MG capsule; Take 1 capsule (20 mg total) by mouth daily. For acid reflux  Dispense: 90 capsule; Refill: 1   Patient's biggest complaint is lack of hearing.  He does try to remove wax with no success at home.  He was seen by ENT several years ago and sent to an audiologist who did testing he was given hearing aids did not seem to work he turned them back in.  He is interested in a second opinion on his ears he went to a [facility] here in [place].  Patient also takes  Valtrex for suppression of H SV.  Patient also goes to pain clinic at [facility] is receiving oxycodone 20 mg 4-5 times daily for chronic pain in the neck and upper back.  Patient does not have a Narcan prescription at home.  He has never overdosed.  Patient also has hypertension on arrival today blood pressure 135/88 which is at goal for him.  Patient is smoking at this time but is trying to reduce the amount he is taking in.  Patient needs follow-up lab studies including metabolic panel and lipid panel at this visit.  Patient has been compliant with the atenolol chlorthalidone combination which does a good job on blood pressure for him.  He does have prediabetes and is treated with diet alone he is taking Pravachol for elevated lipids he was taking Prilosec for gastroesophageal reflux disease but he is no longer needing this medication    Patient Active Problem List   Diagnosis Date Noted   Prediabetes 09/26/2021   Bilateral impacted cerumen 09/26/2021   Subjective tinnitus of both ears 07/24/2019   Bilateral sensorineural hearing loss 01/01/2019   Hyperlipidemia 07/21/2017   History of abscess of skin and subcutaneous tissue 07/21/2017   Mid back pain, chronic 05/26/2015   Chronic midline low back pain without sciatica 05/26/2015   Midline thoracic back pain 03/07/2015   History of vertebral fracture 05/20/2014   Essential hypertension 02/23/2014   Lipoma of face 09/07/2013   Hearing loss 09/07/2013   Dental caries 06/10/2012   Erectile dysfunction 06/10/2012   Tobacco  abuse 06/10/2012   Personal history of goiter 06/10/2012   Genital herpes 03/05/2012   Thoracic back pain 07/21/2009   THYROID NODULE, HX OF 07/21/2009   Past Medical History:  Diagnosis Date   Chronic back pain    Chronic sinusitis    nasal polyps, turbinate hypertrophy, deviated septum   Erectile dysfunction    Goiter    Herpes simplex    Hypercholesteremia    Hypertension    Perirectal abscess    Skin  abscess    Thyroid disease    Tobacco dependence    Past Surgical History:  Procedure Laterality Date   ARM HARDWARE REMOVAL Right    INCISION AND DRAINAGE PERIRECTAL ABSCESS N/A 07/22/2017   Procedure: IRRIGATION, DEBRIDEMENT AND DRAINAGE PERIRECTAL ABSCESS;  Surgeon: [name], MD;  Location: [facility];  Service: General;  Laterality: N/A;   NASAL SEPTOPLASTY W/ TURBINOPLASTY Bilateral 11/13/2019   Procedure: NASAL SEPTOPLASTY WITH TURBINATE REDUCTION;  Surgeon: Melida Quitter, MD;  Location: [place];  Service: ENT;  Laterality: Bilateral;   NECK SURGERY     PAROTIDECTOMY     Dr Redmond Baseman   SINUS ENDO WITH FUSION Bilateral 11/13/2019   Procedure: SINUS ENDO WITH FUSION;  Surgeon: Melida Quitter, MD;  Location: [place];  Service: ENT;  Laterality: Bilateral;   THYROIDECTOMY, PARTIAL  2008   Dr Marlou Starks   Social History   Tobacco Use   Smoking status: Every Day    Packs/day: 0.25    Types: Cigarettes   Smokeless tobacco: Never  Vaping Use   Vaping Use: Never used  Substance Use Topics   Alcohol use: No    Alcohol/week: 0.0 standard drinks of alcohol   Drug use: No   Social History   Socioeconomic History   Marital status: Married    Spouse name: Not on file   Number of children: Not on file   Years of education: Not on file   Highest education level: Not on file  Occupational History   Not on file  Tobacco Use   Smoking status: Every Day    Packs/day: 0.25    Types: Cigarettes   Smokeless tobacco: Never  Vaping Use   Vaping Use: Never used  Substance and Sexual Activity   Alcohol use: No    Alcohol/week: 0.0 standard drinks of alcohol   Drug use: No   Sexual activity: Yes  Other Topics Concern   Not on file  Social History Narrative   ** Merged History Encounter **       Social Determinants of Health   Financial Resource Strain: Not on file  Food Insecurity: Not on file  Transportation Needs: Not on file  Physical Activity: Not on file  Stress: Not on file  Social  Connections: Not on file  Intimate Partner Violence: Not on file   Family Status  Relation Name Status   Mother  Alive   Father  Deceased   Family History  Problem Relation Age of Onset   Stroke Mother    Stroke Father    Allergies  Allergen Reactions   Pork-Derived Products     No pork products; pt is a Muslim      Review of Systems  Constitutional:  Negative for chills, diaphoresis, fever, malaise/fatigue and weight loss.  HENT:  Positive for ear discharge, ear pain and hearing loss. Negative for congestion, nosebleeds, sore throat and tinnitus.   Eyes:  Negative for blurred vision, photophobia and redness.  Respiratory:  Negative for cough, hemoptysis,  sputum production, shortness of breath, wheezing and stridor.   Cardiovascular:  Negative for chest pain, palpitations, orthopnea, claudication, leg swelling and PND.  Gastrointestinal:  Negative for abdominal pain, blood in stool, constipation, diarrhea, heartburn, nausea and vomiting.  Genitourinary:  Negative for dysuria, flank pain, frequency, hematuria and urgency.  Musculoskeletal:  Negative for back pain, falls, joint pain, myalgias and neck pain.  Skin:  Negative for itching and rash.  Neurological:  Negative for dizziness, tingling, tremors, sensory change, speech change, focal weakness, seizures, loss of consciousness, weakness and headaches.  Endo/Heme/Allergies:  Negative for environmental allergies and polydipsia. Does not bruise/bleed easily.  Psychiatric/Behavioral:  Negative for depression, memory loss, substance abuse and suicidal ideas. The patient is not nervous/anxious and does not have insomnia.       Objective:     BP 135/88   Pulse 64   Wt 196 lb (88.9 kg)   SpO2 97%   BMI 26.58 kg/m  BP Readings from Last 3 Encounters:  09/26/21 135/88  03/22/21 129/84  08/24/20 (!) 134/91   Wt Readings from Last 3 Encounters:  09/26/21 196 lb (88.9 kg)  03/22/21 195 lb 12.8 oz (88.8 kg)  08/24/20 188 lb  12.8 oz (85.6 kg)      Physical Exam Vitals reviewed.  Constitutional:      Appearance: Normal appearance. He is well-developed. He is not diaphoretic.  HENT:     Head: Normocephalic and atraumatic.     Right Ear: External ear normal. There is impacted cerumen.     Left Ear: External ear normal. There is impacted cerumen.     Nose: Nose normal. No nasal deformity, septal deviation, mucosal edema or rhinorrhea.     Right Sinus: No maxillary sinus tenderness or frontal sinus tenderness.     Left Sinus: No maxillary sinus tenderness or frontal sinus tenderness.     Mouth/Throat:     Pharynx: No oropharyngeal exudate.  Eyes:     General: No scleral icterus.    Conjunctiva/sclera: Conjunctivae normal.     Pupils: Pupils are equal, round, and reactive to light.  Neck:     Thyroid: No thyromegaly.     Vascular: No carotid bruit or JVD.     Trachea: Trachea normal. No tracheal tenderness or tracheal deviation.  Cardiovascular:     Rate and Rhythm: Normal rate and regular rhythm.     Chest Wall: PMI is not displaced.     Pulses: Normal pulses. No decreased pulses.     Heart sounds: Normal heart sounds, S1 normal and S2 normal. Heart sounds not distant. No murmur heard.    No systolic murmur is present.     No diastolic murmur is present.     No friction rub. No gallop. No S3 or S4 sounds.  Pulmonary:     Effort: No tachypnea, accessory muscle usage or respiratory distress.     Breath sounds: No stridor. No decreased breath sounds, wheezing, rhonchi or rales.  Chest:     Chest wall: No tenderness.  Abdominal:     General: Bowel sounds are normal. There is no distension.     Palpations: Abdomen is soft. Abdomen is not rigid.     Tenderness: There is no abdominal tenderness. There is no guarding or rebound.  Musculoskeletal:        General: Normal range of motion.     Cervical back: Normal range of motion and neck supple. No edema, erythema or rigidity. No muscular tenderness. Normal  range of  motion.  Lymphadenopathy:     Head:     Right side of head: No submental or submandibular adenopathy.     Left side of head: No submental or submandibular adenopathy.     Cervical: No cervical adenopathy.  Skin:    General: Skin is warm and dry.     Coloration: Skin is not pale.     Findings: No rash.     Nails: There is no clubbing.  Neurological:     Mental Status: He is alert and oriented to person, place, and time.     Sensory: No sensory deficit.  Psychiatric:        Speech: Speech normal.        Behavior: Behavior normal.      No results found for any visits on 09/26/21.  Last CBC Lab Results  Component Value Date   WBC 7.2 03/22/2021   HGB 13.9 03/22/2021   HCT 42.0 03/22/2021   MCV 85 03/22/2021   MCH 28.1 03/22/2021   RDW 12.7 03/22/2021   PLT 208 46/56/8127   Last metabolic panel Lab Results  Component Value Date   GLUCOSE 82 03/22/2021   NA 136 03/22/2021   K 4.0 03/22/2021   CL 95 (L) 03/22/2021   CO2 30 (H) 03/22/2021   BUN 10 03/22/2021   CREATININE 0.90 03/22/2021   EGFR 101 03/22/2021   CALCIUM 9.4 03/22/2021   PROT 7.5 03/22/2021   ALBUMIN 4.4 03/22/2021   LABGLOB 3.1 03/22/2021   AGRATIO 1.4 03/22/2021   BILITOT 0.4 03/22/2021   ALKPHOS 78 03/22/2021   AST 16 03/22/2021   ALT 10 03/22/2021   ANIONGAP 9 11/13/2019   Last lipids Lab Results  Component Value Date   CHOL 167 03/22/2021   HDL 41 03/22/2021   LDLCALC 107 (H) 03/22/2021   TRIG 103 03/22/2021   CHOLHDL 4.1 03/22/2021   Last hemoglobin A1c Lab Results  Component Value Date   HGBA1C 5.7 (H) 03/22/2021   Last thyroid functions Lab Results  Component Value Date   TSH 1.390 04/06/2019   Last vitamin D Lab Results  Component Value Date   VD25OH 52.6 08/24/2020      The 10-year ASCVD risk score (Arnett DK, et al., 2019) is: 13.6%    Assessment & Plan:   Problem List Items Addressed This Visit       Cardiovascular and Mediastinum   Essential  hypertension    Hypertension currently at goal we will continue atenolol chlorthalidone combination  The following Lifestyle Medicine recommendations according to [place] of Lifestyle Medicine [facility]) were discussed and offered to patient who agrees to start the journey:  A. Whole Foods, Plant-based plate comprising of fruits and vegetables, plant-based proteins, whole-grain carbohydrates was discussed in detail with the patient.   A list for source of those nutrients were also provided to the patient.  Patient will use only water or unsweetened tea for hydration. B.  The need to stay away from risky substances including alcohol, smoking; obtaining 7 to 9 hours of restorative sleep, at least 150 minutes of moderate intensity exercise weekly, the importance of healthy social connections,  and stress reduction techniques were discussed.        Relevant Medications   atenolol-chlorthalidone (TENORETIC) 50-25 MG tablet   pravastatin (PRAVACHOL) 20 MG tablet   Other Relevant Orders   BMP8+eGFR     Digestive   Dental caries    Recommended regular dental exams      Relevant Medications  valACYclovir (VALTREX) 1000 MG tablet     Nervous and Auditory   Hearing loss    Had bilateral cerumen impaction at this visit these were removed with lavage and will give Debrox we will also send for second opinion evaluation of his hearing loss      Bilateral sensorineural hearing loss   Relevant Orders   Ambulatory referral to ENT   Bilateral impacted cerumen    Both ears cleaned with lavage successfully Debrox issued      Relevant Orders   Ambulatory referral to ENT   Ear Lavage     Genitourinary   Genital herpes    Continues with preventative Valtrex      Relevant Medications   valACYclovir (VALTREX) 1000 MG tablet     Other   Thoracic back pain    To continue with pain management will get Narcan      Tobacco abuse       Current smoking consumption amount: Less than a  pack a day  Dicsussion on advise to quit smoking and smoking impacts: Cardiovascular impacts  Patient's willingness to quit: Not ready to quit  Methods to quit smoking discussed: Behavioral modification  Medication management of smoking session drugs discussed: Not discussed  Resources provided:  AVS   Setting quit date not established  Follow-up arranged 2 months with primary care   Time spent counseling the patient: 5 minutes        Prediabetes - Primary    Given lifestyle medicine approach  Reassess D9I metabolic panel      Relevant Orders   Hemoglobin A1c   BMP8+eGFR   RESOLVED: Dyslipidemia   Relevant Medications   pravastatin (PRAVACHOL) 20 MG tablet   Other Relevant Orders   Lipid panel    Return in about 2 months (around 11/27/2021) for [place].    Asencion Noble, MD

## 2021-09-26 NOTE — Assessment & Plan Note (Signed)
  .   Current smoking consumption amount: Less than a pack a day  . Dicsussion on advise to quit smoking and smoking impacts: Cardiovascular impacts  . Patient's willingness to quit: Not ready to quit  . Methods to quit smoking discussed: Behavioral modification  . Medication management of smoking session drugs discussed: Not discussed  . Resources provided:  AVS   . Setting quit date not established  . Follow-up arranged 2 months with primary care   Time spent counseling the patient: 5 minutes

## 2021-09-26 NOTE — Assessment & Plan Note (Signed)
Hypertension currently at goal we will continue atenolol chlorthalidone combination  The following Lifestyle Medicine recommendations according to [place] [facility]) were discussed and offered to patient who agrees to start the journey:  A. Whole Foods, Plant-based plate comprising of fruits and vegetables, plant-based proteins, whole-grain carbohydrates was discussed in detail with the patient.   A list for source of those nutrients were also provided to the patient.  Patient will use only water or unsweetened tea for hydration. B.  The need to stay away from risky substances including alcohol, smoking; obtaining 7 to 9 hours of restorative sleep, at least 150 minutes of moderate intensity exercise weekly, the importance of healthy social connections,  and stress reduction techniques were discussed.

## 2021-09-26 NOTE — Assessment & Plan Note (Signed)
Both ears cleaned with lavage successfully Debrox issued

## 2021-09-26 NOTE — Patient Instructions (Signed)
Refills on atenolol chlorthalidone given for blood pressure  Refills on pravastatin for elevated cholesterol given  Narcan prescription given in case of accidental overdose on oxycodone  Referral to ear nose and throat at [facility] and [facility] in [place] sent for hearing loss  We cleaned your ears out today with Debrox and a prescription for Debrox earwax removal was sent to our pharmacy  Labs today include hemoglobin A1c lipid panel and metabolic panel  Recommend you follow a healthier diet try to use wild rice or black rice instead of white rice in your diet see nutrition choices on the first page of the lifestyle medicine handout given  Return to see your primary care provider Corey Hale in 2 months

## 2021-09-26 NOTE — Assessment & Plan Note (Signed)
To continue with pain management will get Narcan

## 2021-09-26 NOTE — Assessment & Plan Note (Signed)
Had bilateral cerumen impaction at this visit these were removed with lavage and will give Debrox we will also send for second opinion evaluation of his hearing loss

## 2021-09-26 NOTE — Assessment & Plan Note (Signed)
Continues with preventative Valtrex

## 2021-09-26 NOTE — Assessment & Plan Note (Signed)
Given lifestyle medicine approach  Reassess C7T metabolic panel

## 2021-09-26 NOTE — Assessment & Plan Note (Signed)
Reassess lipids continue Pravachol

## 2021-09-27 DIAGNOSIS — Z79899 Other long term (current) drug therapy: Secondary | ICD-10-CM | POA: Diagnosis not present

## 2021-09-27 LAB — LIPID PANEL
Chol/HDL Ratio: 5.1 ratio — ABNORMAL HIGH (ref 0.0–5.0)
Cholesterol, Total: 178 mg/dL (ref 100–199)
HDL: 35 mg/dL — ABNORMAL LOW (ref >39)
LDL Chol Calc (NIH): 101 mg/dL — ABNORMAL HIGH (ref 0–99)
Triglycerides: 242 mg/dL — ABNORMAL HIGH (ref 0–149)
VLDL Cholesterol Cal: 42 mg/dL — ABNORMAL HIGH (ref 5–40)

## 2021-09-27 LAB — BMP8+EGFR
BUN/Creatinine Ratio: 18 (ref 9–20)
BUN: 15 mg/dL (ref 6–24)
CO2: 26 mmol/L (ref 20–29)
Calcium: 9.7 mg/dL (ref 8.7–10.2)
Chloride: 97 mmol/L (ref 96–106)
Creatinine, Ser: 0.85 mg/dL (ref 0.76–1.27)
Glucose: 76 mg/dL (ref 70–99)
Potassium: 3.5 mmol/L (ref 3.5–5.2)
Sodium: 137 mmol/L (ref 134–144)
eGFR: 102 mL/min/{1.73_m2} (ref >59)

## 2021-09-27 LAB — HEMOGLOBIN A1C
Est. average glucose Bld gHb Est-mCnc: 114 mg/dL
Hgb A1c MFr Bld: 5.6 % (ref 4.8–5.6)

## 2021-09-27 NOTE — Progress Notes (Signed)
Let pt know A1C is normal no diabetes, cholesterol is high stay on pravastatin, normal kidney function,

## 2021-09-28 ENCOUNTER — Telehealth: Payer: Self-pay

## 2021-09-28 NOTE — Telephone Encounter (Signed)
-----   Message from Elsie Stain, MD sent at 09/27/2021  6:06 AM EDT ----- Let pt know A1C is normal no diabetes, cholesterol is high stay on pravastatin, normal kidney function,

## 2021-09-28 NOTE — Telephone Encounter (Signed)
Pt was called and is aware of results, DOB was confirmed.  ?

## 2021-10-04 ENCOUNTER — Other Ambulatory Visit: Payer: Self-pay

## 2021-10-04 MED ORDER — AMOXICILLIN 500 MG PO CAPS
500.0000 mg | ORAL_CAPSULE | Freq: Four times a day (QID) | ORAL | 0 refills | Status: DC
  Filled 2021-10-04: qty 28, 7d supply, fill #0

## 2021-10-04 MED ORDER — IBUPROFEN 600 MG PO TABS
600.0000 mg | ORAL_TABLET | Freq: Four times a day (QID) | ORAL | 0 refills | Status: DC | PRN
  Filled 2021-10-04: qty 30, 8d supply, fill #0

## 2021-10-20 ENCOUNTER — Other Ambulatory Visit (HOSPITAL_COMMUNITY): Payer: Self-pay

## 2021-10-20 DIAGNOSIS — Z79899 Other long term (current) drug therapy: Secondary | ICD-10-CM | POA: Diagnosis not present

## 2021-10-20 DIAGNOSIS — R030 Elevated blood-pressure reading, without diagnosis of hypertension: Secondary | ICD-10-CM | POA: Diagnosis not present

## 2021-10-20 DIAGNOSIS — M546 Pain in thoracic spine: Secondary | ICD-10-CM | POA: Diagnosis not present

## 2021-10-20 DIAGNOSIS — Z6826 Body mass index (BMI) 26.0-26.9, adult: Secondary | ICD-10-CM | POA: Diagnosis not present

## 2021-10-20 MED ORDER — OXYCODONE HCL 20 MG PO TABS
ORAL_TABLET | ORAL | 0 refills | Status: DC
  Filled 2021-10-20: qty 140, 28d supply, fill #0

## 2021-10-24 DIAGNOSIS — Z79899 Other long term (current) drug therapy: Secondary | ICD-10-CM | POA: Diagnosis not present

## 2021-10-30 ENCOUNTER — Other Ambulatory Visit: Payer: Self-pay

## 2021-10-30 ENCOUNTER — Other Ambulatory Visit (HOSPITAL_COMMUNITY): Payer: Self-pay

## 2021-10-31 ENCOUNTER — Other Ambulatory Visit: Payer: Self-pay

## 2021-10-31 ENCOUNTER — Other Ambulatory Visit (HOSPITAL_COMMUNITY): Payer: Self-pay

## 2021-11-17 ENCOUNTER — Other Ambulatory Visit (HOSPITAL_COMMUNITY): Payer: Self-pay

## 2021-11-17 DIAGNOSIS — R030 Elevated blood-pressure reading, without diagnosis of hypertension: Secondary | ICD-10-CM | POA: Diagnosis not present

## 2021-11-17 DIAGNOSIS — M546 Pain in thoracic spine: Secondary | ICD-10-CM | POA: Diagnosis not present

## 2021-11-17 DIAGNOSIS — Z79899 Other long term (current) drug therapy: Secondary | ICD-10-CM | POA: Diagnosis not present

## 2021-11-23 DIAGNOSIS — Z79899 Other long term (current) drug therapy: Secondary | ICD-10-CM | POA: Diagnosis not present

## 2021-11-29 ENCOUNTER — Other Ambulatory Visit: Payer: Self-pay

## 2021-12-15 ENCOUNTER — Other Ambulatory Visit (HOSPITAL_COMMUNITY): Payer: Self-pay

## 2021-12-15 DIAGNOSIS — M546 Pain in thoracic spine: Secondary | ICD-10-CM | POA: Diagnosis not present

## 2021-12-15 DIAGNOSIS — Z79899 Other long term (current) drug therapy: Secondary | ICD-10-CM | POA: Diagnosis not present

## 2021-12-15 MED ORDER — OXYCODONE HCL 20 MG PO TABS
ORAL_TABLET | ORAL | 0 refills | Status: DC
  Filled 2021-12-15: qty 140, 28d supply, fill #0

## 2021-12-21 DIAGNOSIS — Z79899 Other long term (current) drug therapy: Secondary | ICD-10-CM | POA: Diagnosis not present

## 2022-01-02 ENCOUNTER — Other Ambulatory Visit: Payer: Self-pay

## 2022-01-05 ENCOUNTER — Other Ambulatory Visit: Payer: Self-pay

## 2022-01-11 ENCOUNTER — Other Ambulatory Visit (HOSPITAL_COMMUNITY): Payer: Self-pay

## 2022-01-11 DIAGNOSIS — Z6826 Body mass index (BMI) 26.0-26.9, adult: Secondary | ICD-10-CM | POA: Diagnosis not present

## 2022-01-11 DIAGNOSIS — R7309 Other abnormal glucose: Secondary | ICD-10-CM | POA: Diagnosis not present

## 2022-01-11 DIAGNOSIS — M546 Pain in thoracic spine: Secondary | ICD-10-CM | POA: Diagnosis not present

## 2022-01-11 DIAGNOSIS — R030 Elevated blood-pressure reading, without diagnosis of hypertension: Secondary | ICD-10-CM | POA: Diagnosis not present

## 2022-01-11 DIAGNOSIS — K5909 Other constipation: Secondary | ICD-10-CM | POA: Diagnosis not present

## 2022-01-11 DIAGNOSIS — Z79899 Other long term (current) drug therapy: Secondary | ICD-10-CM | POA: Diagnosis not present

## 2022-01-11 MED ORDER — OXYCODONE HCL 20 MG PO TABS
20.0000 mg | ORAL_TABLET | Freq: Every day | ORAL | 0 refills | Status: DC | PRN
  Filled 2022-01-11: qty 135, 28d supply, fill #0
  Filled 2022-01-11: qty 135, 27d supply, fill #0

## 2022-01-16 DIAGNOSIS — Z79899 Other long term (current) drug therapy: Secondary | ICD-10-CM | POA: Diagnosis not present

## 2022-01-31 ENCOUNTER — Other Ambulatory Visit: Payer: Self-pay

## 2022-02-02 ENCOUNTER — Other Ambulatory Visit: Payer: Self-pay

## 2022-02-06 ENCOUNTER — Other Ambulatory Visit (HOSPITAL_COMMUNITY): Payer: Self-pay

## 2022-02-06 DIAGNOSIS — Z1211 Encounter for screening for malignant neoplasm of colon: Secondary | ICD-10-CM | POA: Diagnosis not present

## 2022-02-06 DIAGNOSIS — M546 Pain in thoracic spine: Secondary | ICD-10-CM | POA: Diagnosis not present

## 2022-02-06 DIAGNOSIS — Z79899 Other long term (current) drug therapy: Secondary | ICD-10-CM | POA: Diagnosis not present

## 2022-02-06 DIAGNOSIS — Z6826 Body mass index (BMI) 26.0-26.9, adult: Secondary | ICD-10-CM | POA: Diagnosis not present

## 2022-02-06 MED ORDER — OXYCODONE HCL 20 MG PO TABS
20.0000 mg | ORAL_TABLET | Freq: Every day | ORAL | 0 refills | Status: DC | PRN
  Filled 2022-02-06: qty 130, 26d supply, fill #0

## 2022-02-15 DIAGNOSIS — Z79899 Other long term (current) drug therapy: Secondary | ICD-10-CM | POA: Diagnosis not present

## 2022-02-15 DIAGNOSIS — H903 Sensorineural hearing loss, bilateral: Secondary | ICD-10-CM | POA: Diagnosis not present

## 2022-02-21 ENCOUNTER — Other Ambulatory Visit: Payer: Self-pay

## 2022-02-26 ENCOUNTER — Other Ambulatory Visit: Payer: Self-pay

## 2022-03-05 ENCOUNTER — Other Ambulatory Visit: Payer: Self-pay

## 2022-03-05 ENCOUNTER — Other Ambulatory Visit (HOSPITAL_COMMUNITY): Payer: Self-pay

## 2022-03-07 ENCOUNTER — Other Ambulatory Visit (HOSPITAL_COMMUNITY): Payer: Self-pay

## 2022-03-07 DIAGNOSIS — Z79899 Other long term (current) drug therapy: Secondary | ICD-10-CM | POA: Diagnosis not present

## 2022-03-07 DIAGNOSIS — Z6826 Body mass index (BMI) 26.0-26.9, adult: Secondary | ICD-10-CM | POA: Diagnosis not present

## 2022-03-07 DIAGNOSIS — Z1211 Encounter for screening for malignant neoplasm of colon: Secondary | ICD-10-CM | POA: Diagnosis not present

## 2022-03-07 DIAGNOSIS — M546 Pain in thoracic spine: Secondary | ICD-10-CM | POA: Diagnosis not present

## 2022-03-07 DIAGNOSIS — R030 Elevated blood-pressure reading, without diagnosis of hypertension: Secondary | ICD-10-CM | POA: Diagnosis not present

## 2022-03-07 MED ORDER — OXYCODONE HCL 20 MG PO TABS
ORAL_TABLET | ORAL | 0 refills | Status: DC
  Filled 2022-03-07: qty 145, 29d supply, fill #0

## 2022-03-15 DIAGNOSIS — Z79899 Other long term (current) drug therapy: Secondary | ICD-10-CM | POA: Diagnosis not present

## 2022-03-22 ENCOUNTER — Ambulatory Visit: Payer: Medicaid Other | Admitting: Physician Assistant

## 2022-04-04 ENCOUNTER — Other Ambulatory Visit (HOSPITAL_COMMUNITY): Payer: Self-pay

## 2022-04-04 DIAGNOSIS — R030 Elevated blood-pressure reading, without diagnosis of hypertension: Secondary | ICD-10-CM | POA: Diagnosis not present

## 2022-04-04 DIAGNOSIS — Z1211 Encounter for screening for malignant neoplasm of colon: Secondary | ICD-10-CM | POA: Diagnosis not present

## 2022-04-04 DIAGNOSIS — Z79899 Other long term (current) drug therapy: Secondary | ICD-10-CM | POA: Diagnosis not present

## 2022-04-04 DIAGNOSIS — M546 Pain in thoracic spine: Secondary | ICD-10-CM | POA: Diagnosis not present

## 2022-04-04 MED ORDER — OXYCODONE HCL 20 MG PO TABS
20.0000 mg | ORAL_TABLET | Freq: Every day | ORAL | 0 refills | Status: DC | PRN
  Filled 2022-04-04 – 2022-04-06 (×2): qty 150, 30d supply, fill #0
  Filled 2022-04-06: qty 150, 32d supply, fill #0

## 2022-04-05 ENCOUNTER — Other Ambulatory Visit (HOSPITAL_COMMUNITY): Payer: Self-pay

## 2022-04-06 ENCOUNTER — Other Ambulatory Visit (HOSPITAL_COMMUNITY): Payer: Self-pay

## 2022-04-09 DIAGNOSIS — Z79899 Other long term (current) drug therapy: Secondary | ICD-10-CM | POA: Diagnosis not present

## 2022-04-17 ENCOUNTER — Other Ambulatory Visit: Payer: Self-pay

## 2022-04-17 ENCOUNTER — Ambulatory Visit: Payer: Medicaid Other | Attending: Nurse Practitioner | Admitting: Nurse Practitioner

## 2022-04-17 ENCOUNTER — Encounter: Payer: Self-pay | Admitting: Nurse Practitioner

## 2022-04-17 VITALS — BP 134/91 | HR 74 | Ht 72.0 in | Wt 194.2 lb

## 2022-04-17 DIAGNOSIS — G8929 Other chronic pain: Secondary | ICD-10-CM

## 2022-04-17 DIAGNOSIS — M546 Pain in thoracic spine: Secondary | ICD-10-CM | POA: Diagnosis not present

## 2022-04-17 DIAGNOSIS — I10 Essential (primary) hypertension: Secondary | ICD-10-CM

## 2022-04-17 DIAGNOSIS — Z1211 Encounter for screening for malignant neoplasm of colon: Secondary | ICD-10-CM | POA: Diagnosis not present

## 2022-04-17 DIAGNOSIS — B001 Herpesviral vesicular dermatitis: Secondary | ICD-10-CM

## 2022-04-17 DIAGNOSIS — H9202 Otalgia, left ear: Secondary | ICD-10-CM

## 2022-04-17 DIAGNOSIS — E785 Hyperlipidemia, unspecified: Secondary | ICD-10-CM | POA: Diagnosis not present

## 2022-04-17 DIAGNOSIS — R7303 Prediabetes: Secondary | ICD-10-CM | POA: Diagnosis not present

## 2022-04-17 MED ORDER — NEOMYCIN-POLYMYXIN-HC 1 % OT SOLN
3.0000 [drp] | Freq: Four times a day (QID) | OTIC | 0 refills | Status: AC
  Filled 2022-04-17: qty 10, 17d supply, fill #0

## 2022-04-17 MED ORDER — ATENOLOL-CHLORTHALIDONE 50-25 MG PO TABS
1.0000 | ORAL_TABLET | Freq: Every day | ORAL | 2 refills | Status: DC
  Filled 2022-04-17: qty 90, 90d supply, fill #0
  Filled 2022-06-05: qty 30, 30d supply, fill #0
  Filled 2022-07-04: qty 90, 90d supply, fill #1
  Filled 2022-09-25: qty 90, 90d supply, fill #2

## 2022-04-17 MED ORDER — PRAVASTATIN SODIUM 20 MG PO TABS
20.0000 mg | ORAL_TABLET | Freq: Every day | ORAL | 3 refills | Status: DC
  Filled 2022-04-17 – 2022-05-07 (×2): qty 90, 90d supply, fill #0
  Filled 2022-08-02: qty 90, 90d supply, fill #1
  Filled 2022-11-04: qty 90, 90d supply, fill #2

## 2022-04-17 MED ORDER — GABAPENTIN 600 MG PO TABS
300.0000 mg | ORAL_TABLET | Freq: Three times a day (TID) | ORAL | 0 refills | Status: DC
  Filled 2022-04-17: qty 90, 30d supply, fill #0

## 2022-04-17 MED ORDER — VALACYCLOVIR HCL 1 G PO TABS
1000.0000 mg | ORAL_TABLET | Freq: Every day | ORAL | 3 refills | Status: DC
  Filled 2022-04-17: qty 90, 90d supply, fill #0
  Filled 2022-05-16: qty 30, 30d supply, fill #0
  Filled 2022-06-13: qty 30, 30d supply, fill #1
  Filled 2022-07-29: qty 14, 14d supply, fill #2

## 2022-04-17 NOTE — Progress Notes (Signed)
Ear drainage- left ear  Back spasm.

## 2022-04-17 NOTE — Progress Notes (Signed)
Assessment & Plan:  Corey Hale was seen today for medication refill.  Diagnoses and all orders for this visit:  Primary hypertension Not at goal today.  Work on dietary modifications including DASH diet or Mediterranean diet -     atenolol-chlorthalidone (TENORETIC) 50-25 MG tablet; Take 1 tablet by mouth daily. -     CMP14+EGFR  Dyslipidemia -     pravastatin (PRAVACHOL) 20 MG tablet; Take 1 tablet (20 mg total) by mouth daily.  Herpes labialis Current active outbreak -     valACYclovir (VALTREX) 1000 MG tablet; Take 1 tablet (1,000 mg total) by mouth daily.  Left ear pain -     NEOMYCIN-POLYMYXIN-HYDROCORTISONE (CORTISPORIN) 1 % SOLN OTIC solution; Place 3 drops into the left ear 4 (four) times daily for 7 days.  Prediabetes Well-controlled -     Hemoglobin A1c  Colon cancer screening -     Ambulatory referral to Gastroenterology  Chronic midline thoracic back pain Managed by pain clinic -     gabapentin (NEURONTIN) 600 MG tablet; Take 0.5-1 tablets (300-600 mg total) by mouth 3 (three) times daily.    Patient has been counseled on age-appropriate routine health concerns for screening and prevention. These are reviewed and up-to-date. Referrals have been placed accordingly. Immunizations are up-to-date or declined.    Subjective:   Chief Complaint  Patient presents with   Medication Refill   HPI Corey Hale 57 y.o. male presents to office today for medication refills and follow up to HTN   He has a past medical history of Chronic back pain (Followed by PAIN MANAGEMENT), Chronic sinusitis, Erectile dysfunction, Goiter, Herpes labialis, Hypercholesteremia, Hypertension, Perirectal abscess, Skin abscess, Thyroid disease, and Tobacco dependence.   HTN Blood pressure is slightly elevated today. He is tenoretic daily as prescribed. Needs to work on dietary modifications. If continues elevated we may need to add amlodipine.  BP Readings from Last 3 Encounters:  04/17/22  (!) 134/91  09/26/21 135/88  03/22/21 129/84      Review of Systems  Constitutional:  Negative for fever, malaise/fatigue and weight loss.  HENT: Negative.  Negative for nosebleeds.   Eyes: Negative.  Negative for blurred vision, double vision and photophobia.  Respiratory: Negative.  Negative for cough and shortness of breath.   Cardiovascular: Negative.  Negative for chest pain, palpitations and leg swelling.  Gastrointestinal: Negative.  Negative for heartburn, nausea and vomiting.  Musculoskeletal: Negative.  Negative for myalgias.  Skin:  Positive for rash.  Neurological: Negative.  Negative for dizziness, focal weakness, seizures and headaches.  Psychiatric/Behavioral: Negative.  Negative for suicidal ideas.     Past Medical History:  Diagnosis Date   Chronic back pain    Chronic sinusitis    nasal polyps, turbinate hypertrophy, deviated septum   Erectile dysfunction    Goiter    Herpes simplex    Hypercholesteremia    Hypertension    Perirectal abscess    Skin abscess    Thyroid disease    Tobacco dependence     Past Surgical History:  Procedure Laterality Date   ARM HARDWARE REMOVAL Right    INCISION AND DRAINAGE PERIRECTAL ABSCESS N/A 07/22/2017   Procedure: IRRIGATION, DEBRIDEMENT AND DRAINAGE PERIRECTAL ABSCESS;  Surgeon: [name], MD;  Location: [place];  Service: General;  Laterality: N/A;   NASAL SEPTOPLASTY W/ TURBINOPLASTY Bilateral 11/13/2019   Procedure: NASAL SEPTOPLASTY WITH TURBINATE REDUCTION;  Surgeon: Melida Quitter, MD;  Location: [place];  Service: ENT;  Laterality: Bilateral;  NECK SURGERY     PAROTIDECTOMY     Dr Redmond Baseman   SINUS ENDO WITH FUSION Bilateral 11/13/2019   Procedure: SINUS ENDO WITH FUSION;  Surgeon: Melida Quitter, MD;  Location: [place];  Service: ENT;  Laterality: Bilateral;   THYROIDECTOMY, PARTIAL  2008   Dr Marlou Starks    Family History  Problem Relation Age of Onset   Stroke Mother    Stroke Father     Social History Reviewed  with no changes to be made today.   Outpatient Medications Prior to Visit  Medication Sig Dispense Refill   aspirin 81 MG tablet Take 1 tablet (81 mg total) by mouth daily. 90 tablet 3   Oxycodone HCl 20 MG TABS Take 1 tablet by mouth 5 times daily as needed 140 tablet 0   atenolol-chlorthalidone (TENORETIC) 50-25 MG tablet Take 1 tablet by mouth daily. 90 tablet 2   pravastatin (PRAVACHOL) 20 MG tablet Take 1 tablet by mouth daily. 90 tablet 1   valACYclovir (VALTREX) 1000 MG tablet TAKE 1 TABLET (1,000 MG TOTAL) BY MOUTH DAILY. 90 tablet 1   amoxicillin (AMOXIL) 500 MG capsule Take 1 capsule (500 mg total) by mouth every 6 (six) hours until gone. (Patient not taking: Reported on 04/17/2022) 28 capsule 0   carbamide peroxide (DEBROX) 6.5 % OTIC solution Place 5 drops into both ears 2 (two) times daily. (Patient not taking: Reported on 04/17/2022) 15 mL 0   docusate sodium (COLACE) 50 MG capsule Take 50 mg by mouth daily as needed for mild constipation. (Patient not taking: Reported on 04/17/2022)     fluticasone (FLONASE) 50 MCG/ACT nasal spray Place 2 sprays into both nostrils daily. (Patient not taking: Reported on 04/17/2022) 16 g 6   ibuprofen (ADVIL) 600 MG tablet Take 1 tablet (600 mg total) by mouth every 6 (six) hours as needed for pain (Patient not taking: Reported on 04/17/2022) 30 tablet 0   naloxone (NARCAN) nasal spray 4 mg/0.1 mL Use as needed each nostril for accidental overdosage (Patient not taking: Reported on 04/17/2022) 2 each 1   Oxycodone HCl 20 MG TABS Take 1 tablet (20 mg total) by mouth 5 (five) times daily as needed. (Patient not taking: Reported on 04/17/2022) 130 tablet 0   Oxycodone HCl 20 MG TABS Take 1 Tablet five times daily, as needed (Patient not taking: Reported on 04/17/2022) 145 tablet 0   Oxycodone HCl 20 MG TABS Take 1 tablet (20 mg total) by mouth 5 (five) times daily as needed. (Patient not taking: Reported on 04/17/2022) 150 tablet 0   No facility-administered  medications prior to visit.    Allergies  Allergen Reactions   Pork-Derived Products     No pork products; pt is a Muslim       Objective:    BP (!) 134/91   Pulse 74   Ht 6' (1.829 m)   Wt 194 lb 3.2 oz (88.1 kg)   SpO2 100%   BMI 26.34 kg/m  Wt Readings from Last 3 Encounters:  04/17/22 194 lb 3.2 oz (88.1 kg)  09/26/21 196 lb (88.9 kg)  03/22/21 195 lb 12.8 oz (88.8 kg)    Physical Exam Vitals and nursing note reviewed.  Constitutional:      Appearance: He is well-developed.  HENT:     Head: Normocephalic and atraumatic.     Left Ear: Drainage (foul smelling thick white discharge) present.     Mouth/Throat:     Lips: Lesions (herpes labialis) present.  Cardiovascular:     Rate and Rhythm: Normal rate and regular rhythm.     Heart sounds: Normal heart sounds. No murmur heard.    No friction rub. No gallop.  Pulmonary:     Effort: Pulmonary effort is normal. No tachypnea or respiratory distress.     Breath sounds: Normal breath sounds. No decreased breath sounds, wheezing, rhonchi or rales.  Chest:     Chest wall: No tenderness.  Abdominal:     General: Bowel sounds are normal.     Palpations: Abdomen is soft.  Musculoskeletal:        General: Normal range of motion.     Cervical back: Normal range of motion.  Skin:    General: Skin is warm and dry.  Neurological:     Mental Status: He is alert and oriented to person, place, and time.     Coordination: Coordination normal.  Psychiatric:        Behavior: Behavior normal. Behavior is cooperative.        Thought Content: Thought content normal.        Judgment: Judgment normal.          Patient has been counseled extensively about nutrition and exercise as well as the importance of adherence with medications and regular follow-up. The patient was given clear instructions to go to ER or return to medical center if symptoms don't improve, worsen or new problems develop. The patient verbalized understanding.    Follow-up: Return in about 4 months (around 08/16/2022).   Gildardo Pounds, FNP-BC [facility] [place], [place]   04/21/2022, 8:12 AM

## 2022-04-18 LAB — CMP14+EGFR
ALT: 12 IU/L (ref 0–44)
AST: 11 IU/L (ref 0–40)
Albumin/Globulin Ratio: 1.7 (ref 1.2–2.2)
Albumin: 4.8 g/dL (ref 3.8–4.9)
Alkaline Phosphatase: 69 IU/L (ref 44–121)
BUN/Creatinine Ratio: 10 (ref 9–20)
BUN: 8 mg/dL (ref 6–24)
Bilirubin Total: 0.3 mg/dL (ref 0.0–1.2)
CO2: 28 mmol/L (ref 20–29)
Calcium: 10 mg/dL (ref 8.7–10.2)
Chloride: 95 mmol/L — ABNORMAL LOW (ref 96–106)
Creatinine, Ser: 0.81 mg/dL (ref 0.76–1.27)
Globulin, Total: 2.9 g/dL (ref 1.5–4.5)
Glucose: 75 mg/dL (ref 70–99)
Potassium: 3.6 mmol/L (ref 3.5–5.2)
Sodium: 139 mmol/L (ref 134–144)
Total Protein: 7.7 g/dL (ref 6.0–8.5)
eGFR: 103 mL/min/{1.73_m2} (ref >59)

## 2022-04-18 LAB — HEMOGLOBIN A1C
Est. average glucose Bld gHb Est-mCnc: 117 mg/dL
Hgb A1c MFr Bld: 5.7 % — ABNORMAL HIGH (ref 4.8–5.6)

## 2022-04-19 ENCOUNTER — Other Ambulatory Visit: Payer: Self-pay

## 2022-04-21 ENCOUNTER — Encounter: Payer: Self-pay | Admitting: Nurse Practitioner

## 2022-04-25 ENCOUNTER — Encounter: Payer: Self-pay | Admitting: Nurse Practitioner

## 2022-05-02 ENCOUNTER — Other Ambulatory Visit (HOSPITAL_COMMUNITY): Payer: Self-pay

## 2022-05-02 DIAGNOSIS — Z6826 Body mass index (BMI) 26.0-26.9, adult: Secondary | ICD-10-CM | POA: Diagnosis not present

## 2022-05-02 DIAGNOSIS — M546 Pain in thoracic spine: Secondary | ICD-10-CM | POA: Diagnosis not present

## 2022-05-02 DIAGNOSIS — Z1211 Encounter for screening for malignant neoplasm of colon: Secondary | ICD-10-CM | POA: Diagnosis not present

## 2022-05-02 DIAGNOSIS — Z79899 Other long term (current) drug therapy: Secondary | ICD-10-CM | POA: Diagnosis not present

## 2022-05-02 DIAGNOSIS — R030 Elevated blood-pressure reading, without diagnosis of hypertension: Secondary | ICD-10-CM | POA: Diagnosis not present

## 2022-05-02 MED ORDER — OXYCODONE HCL 20 MG PO TABS
20.0000 mg | ORAL_TABLET | Freq: Every day | ORAL | 0 refills | Status: DC
  Filled ????-??-??: fill #0

## 2022-05-03 ENCOUNTER — Encounter: Payer: Self-pay | Admitting: Nurse Practitioner

## 2022-05-04 ENCOUNTER — Other Ambulatory Visit (HOSPITAL_COMMUNITY): Payer: Self-pay

## 2022-05-05 ENCOUNTER — Other Ambulatory Visit (HOSPITAL_COMMUNITY): Payer: Self-pay

## 2022-05-05 MED ORDER — OXYCODONE HCL 20 MG PO TABS
20.0000 mg | ORAL_TABLET | Freq: Every day | ORAL | 0 refills | Status: DC | PRN
  Filled 2022-05-07: qty 150, 30d supply, fill #0

## 2022-05-07 ENCOUNTER — Other Ambulatory Visit: Payer: Self-pay

## 2022-05-07 ENCOUNTER — Other Ambulatory Visit (HOSPITAL_COMMUNITY): Payer: Self-pay

## 2022-05-07 DIAGNOSIS — Z79899 Other long term (current) drug therapy: Secondary | ICD-10-CM | POA: Diagnosis not present

## 2022-05-08 ENCOUNTER — Other Ambulatory Visit: Payer: Self-pay

## 2022-05-13 ENCOUNTER — Other Ambulatory Visit: Payer: Self-pay | Admitting: Nurse Practitioner

## 2022-05-13 DIAGNOSIS — G8929 Other chronic pain: Secondary | ICD-10-CM

## 2022-05-15 ENCOUNTER — Other Ambulatory Visit: Payer: Self-pay

## 2022-05-15 MED ORDER — GABAPENTIN 600 MG PO TABS
300.0000 mg | ORAL_TABLET | Freq: Three times a day (TID) | ORAL | 2 refills | Status: DC
  Filled 2022-05-15: qty 90, 30d supply, fill #0
  Filled 2022-06-13: qty 90, 30d supply, fill #1
  Filled 2022-07-08: qty 90, 30d supply, fill #2

## 2022-05-15 NOTE — Telephone Encounter (Signed)
Requested Prescriptions  Pending Prescriptions Disp Refills   gabapentin (NEURONTIN) 600 MG tablet 90 tablet 2    Sig: Take 0.5-1 tablets (300-600 mg total) by mouth 3 (three) times daily.     Neurology: Anticonvulsants - gabapentin Passed - 05/13/2022  7:19 PM      Passed - Cr in normal range and within 360 days    Creat  Date Value Ref Range Status  02/23/2014 0.89 0.50 - 1.35 mg/dL Final   Creatinine, Ser  Date Value Ref Range Status  04/17/2022 0.81 0.76 - 1.27 mg/dL Final   Creatinine,U  Date Value Ref Range Status  11/26/2014 171.66 mg/dL Final    Comment:       Cutoff Values for Urine Drug Screen, Pain Mgmt          Drug Class           Cutoff (ng/mL)          Amphetamines             500          Barbiturates             200          Cocaine Metabolites      150          Benzodiazepines          200          Methadone                300          Opiates                  300          Phencyclidine             25          Propoxyphene             300          Marijuana Metabolites     50    For medical purposes only.          Passed - Completed PHQ-2 or PHQ-9 in the last 360 days      Passed - Valid encounter within last 12 months    Recent Outpatient Visits           4 weeks ago Primary hypertension   [place], NP   7 months ago Prediabetes   [place] Elsie Stain, MD   1 year ago Essential hypertension   [place], [place]   1 year ago Essential hypertension   [place], [place]   1 year ago Gastroesophageal reflux disease, unspecified whether esophagitis present   [place] Kimber Relic, MD       Future Appointments             In 3 months Gildardo Pounds, NP [place]

## 2022-05-17 ENCOUNTER — Other Ambulatory Visit: Payer: Self-pay

## 2022-05-18 ENCOUNTER — Other Ambulatory Visit: Payer: Self-pay

## 2022-06-04 ENCOUNTER — Other Ambulatory Visit (HOSPITAL_COMMUNITY): Payer: Self-pay

## 2022-06-04 ENCOUNTER — Other Ambulatory Visit: Payer: Self-pay

## 2022-06-04 DIAGNOSIS — Z79899 Other long term (current) drug therapy: Secondary | ICD-10-CM | POA: Diagnosis not present

## 2022-06-04 DIAGNOSIS — M546 Pain in thoracic spine: Secondary | ICD-10-CM | POA: Diagnosis not present

## 2022-06-04 DIAGNOSIS — R892 Abnormal level of other drugs, medicaments and biological substances in specimens from other organs, systems and tissues: Secondary | ICD-10-CM | POA: Diagnosis not present

## 2022-06-04 MED ORDER — NALOXONE HCL 4 MG/0.1ML NA LIQD
NASAL | 0 refills | Status: DC
  Filled 2022-06-13: qty 2, 1d supply, fill #0

## 2022-06-04 MED ORDER — OXYCODONE HCL 20 MG PO TABS
20.0000 mg | ORAL_TABLET | Freq: Every day | ORAL | 0 refills | Status: DC | PRN
  Filled 2022-06-04: qty 150, 30d supply, fill #0

## 2022-06-05 ENCOUNTER — Other Ambulatory Visit: Payer: Self-pay

## 2022-06-05 ENCOUNTER — Other Ambulatory Visit (HOSPITAL_COMMUNITY): Payer: Self-pay

## 2022-06-06 DIAGNOSIS — Z79899 Other long term (current) drug therapy: Secondary | ICD-10-CM | POA: Diagnosis not present

## 2022-06-07 ENCOUNTER — Other Ambulatory Visit (HOSPITAL_COMMUNITY): Payer: Self-pay

## 2022-06-13 ENCOUNTER — Other Ambulatory Visit: Payer: Self-pay

## 2022-06-15 ENCOUNTER — Other Ambulatory Visit: Payer: Self-pay

## 2022-07-02 ENCOUNTER — Other Ambulatory Visit (HOSPITAL_COMMUNITY): Payer: Self-pay

## 2022-07-02 DIAGNOSIS — R892 Abnormal level of other drugs, medicaments and biological substances in specimens from other organs, systems and tissues: Secondary | ICD-10-CM | POA: Diagnosis not present

## 2022-07-02 DIAGNOSIS — Z6826 Body mass index (BMI) 26.0-26.9, adult: Secondary | ICD-10-CM | POA: Diagnosis not present

## 2022-07-02 DIAGNOSIS — M546 Pain in thoracic spine: Secondary | ICD-10-CM | POA: Diagnosis not present

## 2022-07-02 DIAGNOSIS — Z79899 Other long term (current) drug therapy: Secondary | ICD-10-CM | POA: Diagnosis not present

## 2022-07-02 MED ORDER — OXYCODONE HCL 20 MG PO TABS
20.0000 mg | ORAL_TABLET | Freq: Every day | ORAL | 0 refills | Status: DC | PRN
  Filled 2022-07-02 – 2022-07-05 (×3): qty 150, 30d supply, fill #0

## 2022-07-03 ENCOUNTER — Other Ambulatory Visit (HOSPITAL_COMMUNITY): Payer: Self-pay

## 2022-07-05 ENCOUNTER — Other Ambulatory Visit (HOSPITAL_COMMUNITY): Payer: Self-pay

## 2022-07-05 DIAGNOSIS — Z79899 Other long term (current) drug therapy: Secondary | ICD-10-CM | POA: Diagnosis not present

## 2022-07-06 ENCOUNTER — Other Ambulatory Visit: Payer: Self-pay

## 2022-07-30 ENCOUNTER — Other Ambulatory Visit: Payer: Self-pay

## 2022-07-31 DIAGNOSIS — M546 Pain in thoracic spine: Secondary | ICD-10-CM | POA: Diagnosis not present

## 2022-07-31 DIAGNOSIS — R892 Abnormal level of other drugs, medicaments and biological substances in specimens from other organs, systems and tissues: Secondary | ICD-10-CM | POA: Diagnosis not present

## 2022-07-31 DIAGNOSIS — Z6826 Body mass index (BMI) 26.0-26.9, adult: Secondary | ICD-10-CM | POA: Diagnosis not present

## 2022-07-31 DIAGNOSIS — Z79899 Other long term (current) drug therapy: Secondary | ICD-10-CM | POA: Diagnosis not present

## 2022-08-01 ENCOUNTER — Other Ambulatory Visit: Payer: Self-pay | Admitting: Nurse Practitioner

## 2022-08-01 ENCOUNTER — Other Ambulatory Visit (HOSPITAL_COMMUNITY): Payer: Self-pay

## 2022-08-01 DIAGNOSIS — G8929 Other chronic pain: Secondary | ICD-10-CM

## 2022-08-01 MED ORDER — NALOXONE HCL 4 MG/0.1ML NA LIQD
NASAL | 1 refills | Status: DC
  Filled 2022-08-01: qty 2, 1d supply, fill #0

## 2022-08-01 MED ORDER — OXYCODONE HCL 20 MG PO TABS
20.0000 mg | ORAL_TABLET | Freq: Every day | ORAL | 0 refills | Status: DC | PRN
  Filled 2022-08-01 – 2022-08-04 (×2): qty 150, 30d supply, fill #0

## 2022-08-02 ENCOUNTER — Other Ambulatory Visit: Payer: Self-pay

## 2022-08-02 MED ORDER — GABAPENTIN 600 MG PO TABS
300.0000 mg | ORAL_TABLET | Freq: Three times a day (TID) | ORAL | 0 refills | Status: DC
  Filled 2022-08-02 (×2): qty 270, 90d supply, fill #0

## 2022-08-02 NOTE — Telephone Encounter (Signed)
Requested Prescriptions  Pending Prescriptions Disp Refills   gabapentin (NEURONTIN) 600 MG tablet 270 tablet 0    Sig: Take 0.5-1 tablets (300-600 mg total) by mouth 3 (three) times daily.     Neurology: Anticonvulsants - gabapentin Passed - 08/01/2022 11:44 PM      Passed - Cr in normal range and within 360 days    Creat  Date Value Ref Range Status  02/23/2014 0.89 0.50 - 1.35 mg/dL Final   Creatinine, Ser  Date Value Ref Range Status  04/17/2022 0.81 0.76 - 1.27 mg/dL Final   Creatinine,U  Date Value Ref Range Status  11/26/2014 171.66 mg/dL Final    Comment:       Cutoff Values for Urine Drug Screen, Pain Mgmt          Drug Class           Cutoff (ng/mL)          Amphetamines             500          Barbiturates             200          Cocaine Metabolites      150          Benzodiazepines          200          Methadone                300          Opiates                  300          Phencyclidine             25          Propoxyphene             300          Marijuana Metabolites     50    For medical purposes only.          Passed - Completed PHQ-2 or PHQ-9 in the last 360 days      Passed - Valid encounter within last 12 months    Recent Outpatient Visits           3 months ago Primary hypertension   [place] [facility] [place], Shea Stakes, NP   10 months ago Prediabetes   [facility] Storm Frisk, MD   1 year ago Essential hypertension   [place] [facility] [place], Marzella Schlein, [place]   1 year ago Essential hypertension   [place] [facility] [place], [place], [place]   2 years ago Gastroesophageal reflux disease, unspecified whether esophagitis present   [facility] Debby Bud, MD       Future Appointments             In 2 weeks Claiborne Rigg, NP [facility]

## 2022-08-03 ENCOUNTER — Other Ambulatory Visit: Payer: Self-pay

## 2022-08-03 ENCOUNTER — Other Ambulatory Visit (HOSPITAL_COMMUNITY): Payer: Self-pay

## 2022-08-04 ENCOUNTER — Other Ambulatory Visit (HOSPITAL_COMMUNITY): Payer: Self-pay

## 2022-08-09 ENCOUNTER — Other Ambulatory Visit (HOSPITAL_COMMUNITY): Payer: Self-pay

## 2022-08-16 ENCOUNTER — Encounter: Payer: Self-pay | Admitting: Nurse Practitioner

## 2022-08-16 ENCOUNTER — Ambulatory Visit: Payer: 59 | Attending: Nurse Practitioner | Admitting: Nurse Practitioner

## 2022-08-16 ENCOUNTER — Other Ambulatory Visit: Payer: Self-pay

## 2022-08-16 VITALS — BP 106/67 | HR 66 | Ht 72.0 in | Wt 196.8 lb

## 2022-08-16 DIAGNOSIS — M546 Pain in thoracic spine: Secondary | ICD-10-CM | POA: Diagnosis not present

## 2022-08-16 DIAGNOSIS — M25512 Pain in left shoulder: Secondary | ICD-10-CM

## 2022-08-16 DIAGNOSIS — I10 Essential (primary) hypertension: Secondary | ICD-10-CM | POA: Diagnosis not present

## 2022-08-16 DIAGNOSIS — J342 Deviated nasal septum: Secondary | ICD-10-CM

## 2022-08-16 DIAGNOSIS — R0981 Nasal congestion: Secondary | ICD-10-CM

## 2022-08-16 DIAGNOSIS — B001 Herpesviral vesicular dermatitis: Secondary | ICD-10-CM | POA: Diagnosis not present

## 2022-08-16 DIAGNOSIS — G8929 Other chronic pain: Secondary | ICD-10-CM | POA: Diagnosis not present

## 2022-08-16 MED ORDER — GABAPENTIN 600 MG PO TABS
300.0000 mg | ORAL_TABLET | Freq: Three times a day (TID) | ORAL | 1 refills | Status: DC
  Filled 2022-08-16 – 2022-10-01 (×6): qty 270, 90d supply, fill #0
  Filled 2022-12-07: qty 270, 90d supply, fill #1
  Filled ????-??-??: fill #0

## 2022-08-16 MED ORDER — VALACYCLOVIR HCL 1 G PO TABS
1000.0000 mg | ORAL_TABLET | Freq: Every day | ORAL | 3 refills | Status: DC
  Filled 2022-08-16: qty 30, 30d supply, fill #0
  Filled 2022-09-25: qty 14, 14d supply, fill #1
  Filled 2022-11-04: qty 14, 14d supply, fill #2
  Filled 2022-12-13: qty 14, 14d supply, fill #3
  Filled 2023-01-17: qty 14, 14d supply, fill #4
  Filled 2023-02-16: qty 14, 14d supply, fill #5
  Filled 2023-03-25: qty 14, 14d supply, fill #6
  Filled 2023-04-06: qty 14, 14d supply, fill #7
  Filled 2023-05-13: qty 14, 14d supply, fill #8
  Filled 2023-05-31 (×2): qty 14, 14d supply, fill #9
  Filled 2023-06-19: qty 14, 14d supply, fill #10
  Filled 2023-07-10: qty 14, 14d supply, fill #11
  Filled 2023-08-01: qty 14, 14d supply, fill #12

## 2022-08-16 NOTE — Progress Notes (Signed)
Assessment & Plan:  Corey Hale was seen today for hypertension and shoulder pain.  Diagnoses and all orders for this visit:  Primary hypertension Continue all antihypertensives as prescribed.  Reminded to bring in blood pressure log for follow  up appointment.  RECOMMENDATIONS: DASH/Mediterranean Diets are healthier choices for HTN.    Nasal congestion -     Ambulatory referral to ENT  Nasal septal deviation -     Ambulatory referral to ENT  Herpes labialis -     valACYclovir (VALTREX) 1000 MG tablet; Take 1 tablet (1,000 mg total) by mouth daily.  Chronic left shoulder pain -     DG Shoulder Left; Future -     DG Humerus Left; Future  Chronic midline thoracic back pain -     gabapentin (NEURONTIN) 600 MG tablet; Take 0.5-1 tablets (300-600 mg total) by mouth 3 (three) times daily.    Patient has been counseled on age-appropriate routine health concerns for screening and prevention. These are reviewed and up-to-date. Referrals have been placed accordingly. Immunizations are up-to-date or declined.    Subjective:   Chief Complaint  Patient presents with   Hypertension   Shoulder Pain    Left shoulder pain   HPI Corey Hale 57 y.o. male presents to office today for follow up to HTN and with concerns of left shoulder pain and decreased ROM in the left arm  He will be traveling to [place] for a few months.  He has a past medical history of Chronic back pain (Followed by PAIN MANAGEMENT), Chronic sinusitis, Erectile dysfunction, Goiter, Herpes labialis, Hypercholesteremia, Hypertension, Perirectal abscess, Skin abscess, Thyroid disease, and Tobacco dependence.    HTN Blood pressure is well controlled today. He is taking Tenoretic 50-25 mg daily as prescribed.  BP Readings from Last 3 Encounters:  08/16/22 106/67  04/17/22 (!) 134/91  09/26/21 135/88    Joint/Muscle Pain: Patient complains of arthralgias and myalgias for which has been present for several months. Pain is  located in  the left shoulder and left upper arm laterally , is described as aching, sharp, and tight band, and is intermittent .  Associated symptoms include: decreased range of motion.  The patient has  been taking oxycodone for chronic pain .  Related to injury:   no.  Aggravating factors: lifting objects   ENT Corey Hale has a history of nasal septal deviation requiring septoplasty with turbinate reduction.  Over the past several months he states symptoms of nasal congestion have been worsening and he has been using Afrin daily.  Also having trouble breathing at night and has to breathe through his mouth.  He states current symptoms are similar to previous symptoms prior to his septoplasty.  Denies any URI symptoms today.    Review of Systems  Constitutional:  Negative for fever, malaise/fatigue and weight loss.  HENT:  Positive for congestion. Negative for nosebleeds.   Eyes: Negative.  Negative for blurred vision, double vision and photophobia.  Respiratory: Negative.  Negative for cough and shortness of breath.   Cardiovascular: Negative.  Negative for chest pain, palpitations and leg swelling.  Gastrointestinal: Negative.  Negative for heartburn, nausea and vomiting.  Musculoskeletal:  Positive for back pain, joint pain and myalgias.  Neurological: Negative.  Negative for dizziness, focal weakness, seizures and headaches.  Psychiatric/Behavioral: Negative.  Negative for suicidal ideas.     Past Medical History:  Diagnosis Date   Chronic back pain    Chronic sinusitis    nasal polyps, turbinate  hypertrophy, deviated septum   Erectile dysfunction    Goiter    Herpes simplex    Hypercholesteremia    Hypertension    Perirectal abscess    Skin abscess    Thyroid disease    Tobacco dependence     Past Surgical History:  Procedure Laterality Date   ARM HARDWARE REMOVAL Right    INCISION AND DRAINAGE PERIRECTAL ABSCESS N/A 07/22/2017   Procedure: IRRIGATION, DEBRIDEMENT AND  DRAINAGE PERIRECTAL ABSCESS;  Surgeon: Gaynelle Adu, MD;  [facility];  Service: General;  Laterality: N/A;   NASAL SEPTOPLASTY W/ TURBINOPLASTY Bilateral 11/13/2019   Procedure: NASAL SEPTOPLASTY WITH TURBINATE REDUCTION;  Surgeon: Christia Reading, MD;  Location: [facility];  Service: ENT;  Laterality: Bilateral;   NECK SURGERY     PAROTIDECTOMY     Dr Jenne Pane   SINUS ENDO WITH FUSION Bilateral 11/13/2019   Procedure: SINUS ENDO WITH FUSION;  Surgeon: Christia Reading, MD;  [facility] OR;  Service: ENT;  Laterality: Bilateral;   THYROIDECTOMY, PARTIAL  2008   Dr Carolynne Edouard    Family History  Problem Relation Age of Onset   Stroke Mother    Stroke Father     Social History Reviewed with no changes to be made today.   Outpatient Medications Prior to Visit  Medication Sig Dispense Refill   aspirin 81 MG tablet Take 1 tablet (81 mg total) by mouth daily. 90 tablet 3   atenolol-chlorthalidone (TENORETIC) 50-25 MG tablet Take 1 tablet by mouth daily. 90 tablet 2   naloxone (NARCAN) nasal spray 4 mg/0.1 mL Use 1 (one) spray in the nose as needed as directed on package 2 each 1   Oxycodone HCl 20 MG TABS Take 1 tablet by mouth 5 times daily as needed 140 tablet 0   Oxycodone HCl 20 MG TABS Take 1 tablet (20 mg total) by mouth 5 (five) times daily as needed.  **Per MD do not fill until 07/05/22.** 150 tablet 0   Oxycodone HCl 20 MG TABS Take 1 tablet (20 mg total) by mouth 5 (five) times daily as needed. 150 tablet 0   pravastatin (PRAVACHOL) 20 MG tablet Take 1 tablet (20 mg total) by mouth daily. 90 tablet 3   gabapentin (NEURONTIN) 600 MG tablet Take 0.5-1 tablets (300-600 mg total) by mouth 3 (three) times daily. 270 tablet 0   valACYclovir (VALTREX) 1000 MG tablet Take 1 tablet (1,000 mg total) by mouth daily. 90 tablet 3   No facility-administered medications prior to visit.    Allergies  Allergen Reactions   Pork-Derived Products     No pork products; pt is a Muslim       Objective:    BP  106/67 (BP Location: Left Arm, Patient Position: Sitting, Cuff Size: Normal)   Pulse 66   Ht 6' (1.829 m)   Wt 196 lb 12.8 oz (89.3 kg)   SpO2 98%   BMI 26.69 kg/m  Wt Readings from Last 3 Encounters:  08/16/22 196 lb 12.8 oz (89.3 kg)  04/17/22 194 lb 3.2 oz (88.1 kg)  09/26/21 196 lb (88.9 kg)    Physical Exam Vitals and nursing note reviewed.  Constitutional:      Appearance: He is well-developed.  HENT:     Head: Normocephalic and atraumatic.  Cardiovascular:     Rate and Rhythm: Normal rate and regular rhythm.     Heart sounds: Normal heart sounds. No murmur heard.    No friction rub. No gallop.  Pulmonary:  Effort: Pulmonary effort is normal. No tachypnea or respiratory distress.     Breath sounds: Normal breath sounds. No decreased breath sounds, wheezing, rhonchi or rales.  Chest:     Chest wall: No tenderness.  Abdominal:     General: Bowel sounds are normal.     Palpations: Abdomen is soft.  Musculoskeletal:        General: Normal range of motion.     Left elbow: Deformity present.     Left forearm: Deformity present.     Cervical back: Normal range of motion.     Comments: Left arm is unable to fully extend into straight alignment.  Skin:    General: Skin is warm and dry.  Neurological:     Mental Status: He is alert and oriented to person, place, and time.     Coordination: Coordination normal.  Psychiatric:        Behavior: Behavior normal. Behavior is cooperative.        Thought Content: Thought content normal.        Judgment: Judgment normal.          Patient has been counseled extensively about nutrition and exercise as well as the importance of adherence with medications and regular follow-up. The patient was given clear instructions to go to ER or return to medical center if symptoms don't improve, worsen or new problems develop. The patient verbalized understanding.   Follow-up: Return in about 3 months (around 11/16/2022).   Claiborne Rigg, FNP-BC [facility] [place], [place] [phone]   08/16/2022, 12:30 PM

## 2022-08-20 ENCOUNTER — Other Ambulatory Visit: Payer: Self-pay

## 2022-08-31 ENCOUNTER — Other Ambulatory Visit (HOSPITAL_COMMUNITY): Payer: Self-pay

## 2022-08-31 DIAGNOSIS — Z6826 Body mass index (BMI) 26.0-26.9, adult: Secondary | ICD-10-CM | POA: Diagnosis not present

## 2022-08-31 DIAGNOSIS — M546 Pain in thoracic spine: Secondary | ICD-10-CM | POA: Diagnosis not present

## 2022-08-31 DIAGNOSIS — R892 Abnormal level of other drugs, medicaments and biological substances in specimens from other organs, systems and tissues: Secondary | ICD-10-CM | POA: Diagnosis not present

## 2022-08-31 DIAGNOSIS — Z79899 Other long term (current) drug therapy: Secondary | ICD-10-CM | POA: Diagnosis not present

## 2022-08-31 MED ORDER — OXYCODONE HCL 20 MG PO TABS
20.0000 mg | ORAL_TABLET | Freq: Every day | ORAL | 0 refills | Status: DC | PRN
  Filled 2022-08-31 – 2022-09-03 (×2): qty 150, 30d supply, fill #0

## 2022-09-03 ENCOUNTER — Other Ambulatory Visit (HOSPITAL_COMMUNITY): Payer: Self-pay

## 2022-09-06 DIAGNOSIS — Z79899 Other long term (current) drug therapy: Secondary | ICD-10-CM | POA: Diagnosis not present

## 2022-09-22 ENCOUNTER — Other Ambulatory Visit: Payer: Self-pay

## 2022-09-24 ENCOUNTER — Other Ambulatory Visit: Payer: Self-pay

## 2022-09-25 ENCOUNTER — Other Ambulatory Visit: Payer: Self-pay

## 2022-09-26 ENCOUNTER — Other Ambulatory Visit: Payer: Self-pay

## 2022-09-27 ENCOUNTER — Other Ambulatory Visit: Payer: Self-pay

## 2022-10-01 ENCOUNTER — Other Ambulatory Visit: Payer: Self-pay

## 2022-10-02 ENCOUNTER — Other Ambulatory Visit (HOSPITAL_COMMUNITY): Payer: Self-pay

## 2022-10-02 ENCOUNTER — Other Ambulatory Visit: Payer: Self-pay

## 2022-10-02 DIAGNOSIS — R030 Elevated blood-pressure reading, without diagnosis of hypertension: Secondary | ICD-10-CM | POA: Diagnosis not present

## 2022-10-02 DIAGNOSIS — Z6826 Body mass index (BMI) 26.0-26.9, adult: Secondary | ICD-10-CM | POA: Diagnosis not present

## 2022-10-02 DIAGNOSIS — Z79899 Other long term (current) drug therapy: Secondary | ICD-10-CM | POA: Diagnosis not present

## 2022-10-02 DIAGNOSIS — R892 Abnormal level of other drugs, medicaments and biological substances in specimens from other organs, systems and tissues: Secondary | ICD-10-CM | POA: Diagnosis not present

## 2022-10-02 DIAGNOSIS — M546 Pain in thoracic spine: Secondary | ICD-10-CM | POA: Diagnosis not present

## 2022-10-03 ENCOUNTER — Other Ambulatory Visit (HOSPITAL_COMMUNITY): Payer: Self-pay

## 2022-10-03 MED ORDER — OXYCODONE HCL 20 MG PO TABS
ORAL_TABLET | ORAL | 0 refills | Status: DC
  Filled 2022-10-03: qty 150, 30d supply, fill #0

## 2022-10-04 DIAGNOSIS — Z79899 Other long term (current) drug therapy: Secondary | ICD-10-CM | POA: Diagnosis not present

## 2022-11-02 ENCOUNTER — Other Ambulatory Visit (HOSPITAL_COMMUNITY): Payer: Self-pay

## 2022-11-02 DIAGNOSIS — Z79899 Other long term (current) drug therapy: Secondary | ICD-10-CM | POA: Diagnosis not present

## 2022-11-02 DIAGNOSIS — R030 Elevated blood-pressure reading, without diagnosis of hypertension: Secondary | ICD-10-CM | POA: Diagnosis not present

## 2022-11-02 DIAGNOSIS — R892 Abnormal level of other drugs, medicaments and biological substances in specimens from other organs, systems and tissues: Secondary | ICD-10-CM | POA: Diagnosis not present

## 2022-11-02 DIAGNOSIS — Z6826 Body mass index (BMI) 26.0-26.9, adult: Secondary | ICD-10-CM | POA: Diagnosis not present

## 2022-11-02 DIAGNOSIS — M546 Pain in thoracic spine: Secondary | ICD-10-CM | POA: Diagnosis not present

## 2022-11-02 MED ORDER — OXYCODONE HCL 20 MG PO TABS
ORAL_TABLET | ORAL | 0 refills | Status: DC
  Filled 2022-11-02: qty 150, 30d supply, fill #0

## 2022-11-08 ENCOUNTER — Ambulatory Visit: Payer: Self-pay | Admitting: *Deleted

## 2022-11-08 DIAGNOSIS — Z79899 Other long term (current) drug therapy: Secondary | ICD-10-CM | POA: Diagnosis not present

## 2022-11-08 NOTE — Telephone Encounter (Signed)
  Chief Complaint: Bilateral leg and knee pain Symptoms: above Frequency: For the last week. Pertinent Negatives: Patient denies accidents or injuries Disposition: [] ED /[] Urgent Care (no appt availability in office) / [x] Appointment(In office/virtual)/ []  [place] [facility]/ [] Home Care/ [] Refused Recommended Disposition /[]  Mobile Bus/ []  Follow-up with PCP Additional Notes: Appt made with Georgian Co, PA-C for 12/13/2022 at 1:30.

## 2022-11-08 NOTE — Telephone Encounter (Signed)
Reason for Disposition  [1] MILD pain (e.g., does not interfere with normal activities) AND [2] present > 7 days  Answer Assessment - Initial Assessment Questions 1. ONSET: "When did the pain start?"      I have pain in my leg and my knee. 2. LOCATION: "Where is the pain located?"      Left and right knees and legs are hurting.   No accidents or injuries.    2 weeks ago it was bad pain.  The pain went away but has come back. 3. PAIN: "How bad is the pain?"    (Scale 1-10; or mild, moderate, severe)   -  MILD (1-3): doesn't interfere with normal activities    -  MODERATE (4-7): interferes with normal activities (e.g., work or school) or awakens from sleep, limping    -  SEVERE (8-10): excruciating pain, unable to do any normal activities, unable to walk     5/10  4. WORK OR EXERCISE: "Has there been any recent work or exercise that involved this part of the body?"      I can walk. 5. CAUSE: "What do you think is causing the leg pain?"     I don't know 6. OTHER SYMPTOMS: "Do you have any other symptoms?" (e.g., chest pain, back pain, breathing difficulty, swelling, rash, fever, numbness, weakness)     No 7. PREGNANCY: "Is there any chance you are pregnant?" "When was your last menstrual period?"     N/A  Protocols used: Leg Pain-A-AH

## 2022-11-12 ENCOUNTER — Other Ambulatory Visit: Payer: Self-pay

## 2022-11-30 ENCOUNTER — Other Ambulatory Visit (HOSPITAL_COMMUNITY): Payer: Self-pay

## 2022-11-30 MED ORDER — OXYCODONE HCL 20 MG PO TABS
20.0000 mg | ORAL_TABLET | Freq: Every day | ORAL | 0 refills | Status: AC | PRN
Start: 2022-11-30 — End: ?
  Filled 2022-11-30: qty 25, 6d supply, fill #0

## 2022-12-04 ENCOUNTER — Emergency Department (HOSPITAL_BASED_OUTPATIENT_CLINIC_OR_DEPARTMENT_OTHER): Payer: Medicaid Other

## 2022-12-04 ENCOUNTER — Other Ambulatory Visit: Payer: Self-pay

## 2022-12-04 ENCOUNTER — Inpatient Hospital Stay (HOSPITAL_BASED_OUTPATIENT_CLINIC_OR_DEPARTMENT_OTHER)
Admission: EM | Admit: 2022-12-04 | Discharge: 2022-12-06 | DRG: 281 | Disposition: A | Discharge status: Home / Self Care | Payer: Medicaid Other | Attending: Internal Medicine | Admitting: Internal Medicine

## 2022-12-04 ENCOUNTER — Other Ambulatory Visit (HOSPITAL_COMMUNITY): Payer: Self-pay

## 2022-12-04 ENCOUNTER — Encounter (HOSPITAL_BASED_OUTPATIENT_CLINIC_OR_DEPARTMENT_OTHER): Payer: Self-pay

## 2022-12-04 DIAGNOSIS — E7800 Pure hypercholesterolemia, unspecified: Secondary | ICD-10-CM | POA: Diagnosis present

## 2022-12-04 DIAGNOSIS — G8929 Other chronic pain: Secondary | ICD-10-CM | POA: Diagnosis present

## 2022-12-04 DIAGNOSIS — R0989 Other specified symptoms and signs involving the circulatory and respiratory systems: Secondary | ICD-10-CM | POA: Diagnosis not present

## 2022-12-04 DIAGNOSIS — R7303 Prediabetes: Secondary | ICD-10-CM | POA: Diagnosis present

## 2022-12-04 DIAGNOSIS — I214 Non-ST elevation (NSTEMI) myocardial infarction: Secondary | ICD-10-CM | POA: Diagnosis present

## 2022-12-04 DIAGNOSIS — E876 Hypokalemia: Secondary | ICD-10-CM | POA: Diagnosis present

## 2022-12-04 DIAGNOSIS — Z6825 Body mass index (BMI) 25.0-25.9, adult: Secondary | ICD-10-CM

## 2022-12-04 DIAGNOSIS — E785 Hyperlipidemia, unspecified: Secondary | ICD-10-CM | POA: Diagnosis present

## 2022-12-04 DIAGNOSIS — Z823 Family history of stroke: Secondary | ICD-10-CM

## 2022-12-04 DIAGNOSIS — Z7982 Long term (current) use of aspirin: Secondary | ICD-10-CM

## 2022-12-04 DIAGNOSIS — J9811 Atelectasis: Secondary | ICD-10-CM | POA: Diagnosis not present

## 2022-12-04 DIAGNOSIS — E663 Overweight: Secondary | ICD-10-CM | POA: Diagnosis present

## 2022-12-04 DIAGNOSIS — Z91014 Allergy to mammalian meats: Secondary | ICD-10-CM

## 2022-12-04 DIAGNOSIS — I10 Essential (primary) hypertension: Secondary | ICD-10-CM | POA: Diagnosis present

## 2022-12-04 DIAGNOSIS — I2510 Atherosclerotic heart disease of native coronary artery without angina pectoris: Principal | ICD-10-CM | POA: Diagnosis present

## 2022-12-04 DIAGNOSIS — R079 Chest pain, unspecified: Secondary | ICD-10-CM | POA: Diagnosis not present

## 2022-12-04 DIAGNOSIS — Z720 Tobacco use: Secondary | ICD-10-CM | POA: Diagnosis present

## 2022-12-04 DIAGNOSIS — K219 Gastro-esophageal reflux disease without esophagitis: Secondary | ICD-10-CM | POA: Diagnosis present

## 2022-12-04 DIAGNOSIS — F17210 Nicotine dependence, cigarettes, uncomplicated: Secondary | ICD-10-CM | POA: Diagnosis present

## 2022-12-04 DIAGNOSIS — Z79899 Other long term (current) drug therapy: Secondary | ICD-10-CM

## 2022-12-04 DIAGNOSIS — S42001A Fracture of unspecified part of right clavicle, initial encounter for closed fracture: Secondary | ICD-10-CM | POA: Diagnosis not present

## 2022-12-04 DIAGNOSIS — I110 Hypertensive heart disease with heart failure: Secondary | ICD-10-CM | POA: Diagnosis present

## 2022-12-04 DIAGNOSIS — I2119 ST elevation (STEMI) myocardial infarction involving other coronary artery of inferior wall: Principal | ICD-10-CM | POA: Diagnosis present

## 2022-12-04 DIAGNOSIS — E871 Hypo-osmolality and hyponatremia: Secondary | ICD-10-CM | POA: Diagnosis present

## 2022-12-04 DIAGNOSIS — D72829 Elevated white blood cell count, unspecified: Secondary | ICD-10-CM | POA: Diagnosis present

## 2022-12-04 DIAGNOSIS — I5022 Chronic systolic (congestive) heart failure: Secondary | ICD-10-CM | POA: Diagnosis present

## 2022-12-04 LAB — CBC
HCT: 39.8 % (ref 39.0–52.0)
Hemoglobin: 13.4 g/dL (ref 13.0–17.0)
MCH: 28.9 pg (ref 26.0–34.0)
MCHC: 33.7 g/dL (ref 30.0–36.0)
MCV: 85.8 fL (ref 80.0–100.0)
Platelets: 208 10*3/uL (ref 150–400)
RBC: 4.64 MIL/uL (ref 4.22–5.81)
RDW: 13.3 % (ref 11.5–15.5)
WBC: 15.2 10*3/uL — ABNORMAL HIGH (ref 4.0–10.5)
nRBC: 0 % (ref 0.0–0.2)

## 2022-12-04 LAB — BASIC METABOLIC PANEL
Anion gap: 11 (ref 5–15)
BUN: 12 mg/dL (ref 6–20)
CO2: 28 mmol/L (ref 22–32)
Calcium: 8.8 mg/dL — ABNORMAL LOW (ref 8.9–10.3)
Chloride: 88 mmol/L — ABNORMAL LOW (ref 98–111)
Creatinine, Ser: 1.04 mg/dL (ref 0.61–1.24)
GFR, Estimated: >60 mL/min (ref >60)
Glucose, Bld: 130 mg/dL — ABNORMAL HIGH (ref 70–99)
Potassium: 2.4 mmol/L — CL (ref 3.5–5.1)
Sodium: 127 mmol/L — ABNORMAL LOW (ref 135–145)

## 2022-12-04 LAB — TROPONIN I (HIGH SENSITIVITY)
Troponin I (High Sensitivity): 8912 ng/L (ref <18)
Troponin I (High Sensitivity): 8232 ng/L (ref <18)

## 2022-12-04 LAB — MAGNESIUM: Magnesium: 2.1 mg/dL (ref 1.7–2.4)

## 2022-12-04 LAB — D-DIMER, QUANTITATIVE: D-Dimer, Quant: 0.63 ug{FEU}/mL — ABNORMAL HIGH (ref 0.00–0.50)

## 2022-12-04 MED ORDER — SODIUM CHLORIDE 0.9 % IV SOLN: Freq: Once | INTRAVENOUS | Status: AC

## 2022-12-04 MED ORDER — HEPARIN (PORCINE) 25000 UT/250ML-% IV SOLN
1250.0000 [IU]/h | INTRAVENOUS | Status: DC
  Administered 2022-12-04: 1000 [IU]/h via INTRAVENOUS

## 2022-12-04 MED ORDER — FAMOTIDINE 20 MG PO TABS
20.0000 mg | ORAL_TABLET | Freq: Once | ORAL | Status: AC
  Administered 2022-12-04: 20 mg via ORAL

## 2022-12-04 MED ORDER — FAMOTIDINE 20 MG PO TABS
ORAL_TABLET | ORAL | Status: AC
  Filled 2022-12-04: qty 2

## 2022-12-04 MED ORDER — NITROGLYCERIN IN D5W 200-5 MCG/ML-% IV SOLN
0.0000 ug/min | INTRAVENOUS | Status: DC
  Administered 2022-12-05: 85 ug/min via INTRAVENOUS
  Administered 2022-12-06: 40 ug/min via INTRAVENOUS
  Filled 2022-12-04: qty 250

## 2022-12-04 MED ORDER — HEPARIN (PORCINE) 25000 UT/250ML-% IV SOLN
INTRAVENOUS | Status: AC
  Administered 2022-12-04: 4000 [IU] via INTRAVENOUS
  Filled 2022-12-04: qty 250

## 2022-12-04 MED ORDER — POTASSIUM CHLORIDE CRYS ER 20 MEQ PO TBCR
EXTENDED_RELEASE_TABLET | ORAL | Status: AC
  Filled 2022-12-04: qty 3

## 2022-12-04 MED ORDER — NITROGLYCERIN IN D5W 200-5 MCG/ML-% IV SOLN
INTRAVENOUS | Status: AC
  Administered 2022-12-04: 5 ug/min via INTRAVENOUS
  Filled 2022-12-04: qty 250

## 2022-12-04 MED ORDER — MORPHINE SULFATE (PF) 4 MG/ML IV SOLN
4.0000 mg | Freq: Once | INTRAVENOUS | Status: AC
  Administered 2022-12-04: 4 mg via INTRAVENOUS

## 2022-12-04 MED ORDER — MORPHINE SULFATE (PF) 4 MG/ML IV SOLN
INTRAVENOUS | Status: AC
  Filled 2022-12-04: qty 1

## 2022-12-04 MED ORDER — OXYCODONE HCL 20 MG PO TABS
ORAL_TABLET | ORAL | 0 refills | Status: DC
Start: 2022-12-04 — End: 2022-12-06
  Filled 2022-12-04: qty 150, 30d supply, fill #0

## 2022-12-04 MED ORDER — ASPIRIN 81 MG PO CHEW
CHEWABLE_TABLET | ORAL | Status: AC
  Filled 2022-12-04: qty 4

## 2022-12-04 MED ORDER — POTASSIUM CHLORIDE 10 MEQ/100ML IV SOLN
10.0000 meq | INTRAVENOUS | Status: AC
  Administered 2022-12-04 (×2): 10 meq via INTRAVENOUS
  Filled 2022-12-04: qty 100

## 2022-12-04 MED ORDER — POTASSIUM CHLORIDE CRYS ER 20 MEQ PO TBCR
60.0000 meq | EXTENDED_RELEASE_TABLET | Freq: Once | ORAL | Status: AC
  Administered 2022-12-04: 60 meq via ORAL

## 2022-12-04 MED ORDER — POTASSIUM CHLORIDE 10 MEQ/100ML IV SOLN
INTRAVENOUS | Status: AC
  Filled 2022-12-04: qty 100

## 2022-12-04 MED ORDER — HEPARIN BOLUS VIA INFUSION: 4000.0000 [IU] | Freq: Once | INTRAVENOUS | Status: AC

## 2022-12-04 MED ORDER — ASPIRIN 81 MG PO CHEW
324.0000 mg | CHEWABLE_TABLET | Freq: Once | ORAL | Status: AC
  Administered 2022-12-04: 324 mg via ORAL

## 2022-12-04 NOTE — ED Notes (Signed)
Due to systolic blood pressure hovering at 100, will hold NTG at 48mcg/min. Patients pain improving. 6/10

## 2022-12-04 NOTE — Progress Notes (Signed)
ANTICOAGULATION CONSULT NOTE   Pharmacy Consult for IV heparin Indication: chest pain/ACS  Allergies  Allergen Reactions   Pork-Derived Products     No pork products; pt is a Muslim    Patient Measurements: Height: 6' (182.9 cm) Weight: 86.2 kg (190 lb) IBW/kg (Calculated) : 77.6 Heparin Dosing Weight: 86.2  Vital Signs: Temp: 98.9 F (37.2 C) (09/17 2042) BP: 105/77 (09/17 2042) Pulse Rate: 66 (09/17 2042)  Labs: Recent Labs    12/04/22 2048  HGB 13.4  HCT 39.8  PLT 208  CREATININE 1.04  TROPONINIHS 8,912*    Estimated Creatinine Clearance: 86 mL/min (by C-G formula based on SCr of 1.04 mg/dL).   Medical History: Past Medical History:  Diagnosis Date   Chronic back pain    Chronic sinusitis    nasal polyps, turbinate hypertrophy, deviated septum   Erectile dysfunction    Goiter    Herpes simplex    Hypercholesteremia    Hypertension    Perirectal abscess    Skin abscess    Thyroid disease    Tobacco dependence     Assessment: Corey Hale is a 57 y.o. year old male admitted on 12/04/2022 with concern for NSTEMI. No anticoagulation prior to admission. Of note, pt has note in chart to avoid pork-derived products but per MD was agreeable to starting heparin. Pharmacy consulted to dose heparin.  Goal of Therapy:  Heparin level 0.3-0.7 units/ml Monitor platelets by anticoagulation protocol: Yes   Plan:  Heparin 4000 units x 1 as bolus followed by heparin infusion at 1000 units/hr 6 heparin level  Daily heparin level, CBC, and monitoring for bleeding F/u plans for anticoagulation   Thank you for allowing pharmacy to participate in this patient's care.  Marja Kays, PharmD Emergency Medicine Clinical Pharmacist 12/04/2022,11:40 PM

## 2022-12-04 NOTE — ED Triage Notes (Signed)
Pt c/o mid-sternal chest pain x 3 days, intermittently, pain with inhalation but denies SOB, n/v, dizziness.

## 2022-12-04 NOTE — ED Notes (Signed)
Patients blood pressure goal hit at 110 systolic. Will hold at 45mcg/min NTG until blood pressure improves.

## 2022-12-04 NOTE — ED Provider Notes (Signed)
[facility] Provider Note   CSN: [id number] Arrival date & time: 12/04/22  2035     History  Chief Complaint  Patient presents with   Chest Pain    Corey Hale is a 57 y.o. male with a past medical history of HTN who presents to the emergency department for evaluation of chest pain.  He stated that he had chest pain with similar symptoms on Sunday that resolved completely.  He stated that the chest pain came back today around 5/6 PM after he ate a meal.  He reports pleuritic bilateral chest pain that feels like he has a lot of gas and that he needs to belch.  Patient is currently experiencing heartburn symptoms as well.  He denies shortness of breath, cough, fever, chill abdominal pain, nausea, vomiting, diarrhea.  He states that he has poor oral intake because he does not his chest pain to worsen at this moment.  He tried Prilosec this morning and gas-x, neither improved the chest pain. He denies recent travel, surgeries or history of blood clots.  His last bowel movement was Sunday.      Home Medications Prior to Admission medications   Medication Sig Start Date End Date Taking? Authorizing Provider  aspirin 81 MG tablet Take 1 tablet (81 mg total) by mouth daily. 03/07/15   Quentin Angst, MD  atenolol-chlorthalidone (TENORETIC) 50-25 MG tablet Take 1 tablet by mouth daily. 04/17/22   Claiborne Rigg, NP  gabapentin (NEURONTIN) 600 MG tablet Take 0.5-1 tablets (300-600 mg total) by mouth 3 (three) times daily. 08/16/22   Claiborne Rigg, NP  naloxone [facility]) nasal spray 4 mg/0.1 mL Use 1 (one) spray in the nose as needed as directed on package 07/31/22     Oxycodone HCl 20 MG TABS Take 1 tablet by mouth 5 times daily as needed 12/15/21     Oxycodone HCl 20 MG TABS Take 1 tablet (20 mg total) by mouth 5 (five) times daily as needed.  **Per MD do not fill until 07/05/22.** 07/02/22     Oxycodone HCl 20 MG TABS Take 1 tablet (20 mg) by  mouth 5 times daily as needed. (fill 09/03/22) 08/31/22     Oxycodone HCl 20 MG TABS Take 1 Tablet five times a day as needed. 10/02/22     Oxycodone HCl 20 MG TABS Take 1 Tablet five times daily, as needed 11/02/22     Oxycodone HCl 20 MG TABS Take 1 tablet (20 mg total) by mouth 5 (five) times daily as needed. 11/30/22     Oxycodone HCl 20 MG TABS Take 1 Tablet five times daily, as needed 12/04/22     pravastatin (PRAVACHOL) 20 MG tablet Take 1 tablet (20 mg total) by mouth daily. 04/17/22   Claiborne Rigg, NP  valACYclovir (VALTREX) 1000 MG tablet Take 1 tablet (1,000 mg total) by mouth daily. 08/16/22   Claiborne Rigg, NP      Allergies    Pork-derived products    Review of Systems   Review of Systems  Constitutional:  Positive for appetite change. Negative for chills, diaphoresis and fever.  Respiratory:  Negative for cough, chest tightness and shortness of breath.   Cardiovascular:  Positive for chest pain. Negative for palpitations and leg swelling.  Gastrointestinal:  Negative for abdominal pain, nausea and vomiting.       Reports "gas" pain    Physical Exam Updated Vital Signs BP 124/78  Pulse 68   Temp 98.9 F (37.2 C)   Resp (!) 25   Ht 6' (1.829 m)   Wt 86.2 kg   SpO2 96%   BMI 25.77 kg/m  Physical Exam Vitals reviewed.  Constitutional:      General: He is awake. He is in acute distress.     Appearance: He is overweight. He is not ill-appearing, toxic-appearing or diaphoretic.  Cardiovascular:     Rate and Rhythm: Normal rate and regular rhythm.     Pulses:          Radial pulses are 2+ on the right side and 2+ on the left side.     Heart sounds: Normal heart sounds. No murmur heard.    No friction rub.     Comments: Bilateral anterior chest tenderness to palpation. No chest crepitus appreciated on exam.  Pulmonary:     Effort: Pulmonary effort is normal. No respiratory distress.     Breath sounds: Normal breath sounds. No wheezing.  Musculoskeletal:      Right lower leg: No edema.     Left lower leg: No edema.  Skin:    General: Skin is warm and dry.     Coloration: Skin is not pale.  Neurological:     Mental Status: He is alert and oriented to person, place, and time.  Psychiatric:        Mood and Affect: Mood normal.        Behavior: Behavior is cooperative.     ED Results / Procedures / Treatments   Labs (all labs ordered are listed, but only abnormal results are displayed) Labs Reviewed  BASIC METABOLIC PANEL - Abnormal; Notable for the following components:      Result Value   Sodium 127 (*)    Potassium 2.4 (*)    Chloride 88 (*)    Glucose, Bld 130 (*)    Calcium 8.8 (*)    All other components within normal limits  CBC - Abnormal; Notable for the following components:   WBC 15.2 (*)    All other components within normal limits  D-DIMER, QUANTITATIVE - Abnormal; Notable for the following components:   D-Dimer, Quant 0.63 (*)    All other components within normal limits  TROPONIN I (HIGH SENSITIVITY) - Abnormal; Notable for the following components:   Troponin I (High Sensitivity) 8,295 (*)    All other components within normal limits  MAGNESIUM  TROPONIN I (HIGH SENSITIVITY)    EKG None  Radiology DG Chest 2 View  Result Date: 12/04/2022 CLINICAL DATA:  Chest pain for 2 days then. Chest pain came back tonight around dinnertime. EXAM: CHEST - 2 VIEW COMPARISON:  Radiographs 08/21/2019 FINDINGS: Low lung volumes accentuate cardiomediastinal silhouette and pulmonary vascularity. Bibasilar atelectasis. Aortic atherosclerotic calcification. Remote right clavicle fracture. Chronic compression of mid and lower thoracic vertebral bodies. IMPRESSION: Low lung volumes with basilar atelectasis. Electronically Signed   By: Minerva Fester M.D.   On: 12/04/2022 21:24    Procedures Procedures    Medications Ordered in ED Medications  potassium chloride 10 mEq in 100 mL IVPB (10 mEq Intravenous New Bag/Given 12/04/22 2225)   nitroGLYCERIN 50 mg in dextrose 5 % 250 mL (0.2 mg/mL) infusion (15 mcg/min Intravenous Rate/Dose Change 12/04/22 2308)  heparin ADULT infusion 100 units/mL (25000 units/273mL) (1,000 Units/hr Intravenous New Bag/Given 12/04/22 2241)  famotidine (PEPCID) tablet 20 mg (20 mg Oral Given 12/04/22 2235)  potassium chloride SA (KLOR-CON M) CR tablet 60 mEq (60  mEq Oral Given 12/04/22 2235)  aspirin chewable tablet 324 mg (324 mg Oral Given 12/04/22 2208)  morphine (PF) 4 MG/ML injection 4 mg (4 mg Intravenous Given 12/04/22 2209)  0.9 %  sodium chloride infusion ( Intravenous New Bag/Given 12/04/22 2224)  heparin bolus via infusion 4,000 Units (4,000 Units Intravenous Bolus from Bag 12/04/22 2241)    ED Course/ Medical Decision Making/ A&P Clinical Course as of 12/04/22 2310  Tue Dec 04, 2022  2146 Stable (Resident) 62 YOM with a chief complaint of chest pain. BL in nature.  Worse on inspiration.  Hx of HLD/HTN and prediabetes. [CC]  2150 Plan Serial trop and DDIMER with CTA PRN for his chest pain  Hypokalmia: and 20 IV and check mag   [CC]    Clinical Course User Index [CC] Glyn Ade, MD                                 Medical Decision Making Patient is a 57 year old male with a past medical history of hypertension who presents emergency department today for evaluation of chest pain.  Physical exam was remarkable for reproducible chest pain.  Upon initial evaluation, patient was in severe pain and continued to attempt  belch to remove the "gas "pain.  Initial EKG does not show significant changes to ST segments, repeat EKG also does not show changes the ST segments.  Initial troponin recorded at 8912.  Patient was began on aspirin and heparin drip for treatment of an NSTEMI, cardiology was called and patient will be admitted for NSTEMI treatment.  He was provided nitroglycerin drip as well as morphine.  Vital signs at this moment are stable.  Initial laboratory work showed  potassium of 2.4, hypokalemia could be due to poor oral intake over the past 2 days to the chest pain.  He denies vomiting or diarrhea making GI losses of potassium unlikely at this time.  I was unable to find a medication on his list I would also be causing hypokalemia.  He was provided with IV potassium along with of oral potassium.  Other etiologies considered are mediastinitis, esophageal perforation, myocarditis, pericarditis; however, these are less likely at this time.  Amount and/or Complexity of Data Reviewed Labs: ordered.    Details: CBC: Leukocytosis of 15.2 BMP: Hypokalemia, potassium of 2.4 Troponin: 4034  Radiology: ordered and independent interpretation performed.    Details: Agree with radiologist findings ECG/medicine tests: ordered and independent interpretation performed.    Details: Both EKGs do not show significant ST segment depressions or elevations.   Risk OTC drugs. Prescription drug management. Decision regarding hospitalization.          Final Clinical Impression(s) / ED Diagnoses Final diagnoses:  None    Rx / DC Orders ED Discharge Orders     None         Faith Rogue, DO 12/04/22 2311    Glyn Ade, MD 12/05/22 1458

## 2022-12-04 NOTE — ED Notes (Signed)
Patients blood pressure goal hit at 110 systolic. Will hold at 78mcg/min NTG until blood pressure improves.

## 2022-12-05 ENCOUNTER — Other Ambulatory Visit: Payer: Self-pay

## 2022-12-05 ENCOUNTER — Encounter (HOSPITAL_COMMUNITY)
Admission: EM | Disposition: A | Discharge status: Home / Self Care | Payer: Self-pay | Source: Home / Self Care | Attending: Internal Medicine

## 2022-12-05 ENCOUNTER — Inpatient Hospital Stay (HOSPITAL_COMMUNITY): Payer: Medicaid Other

## 2022-12-05 DIAGNOSIS — I214 Non-ST elevation (NSTEMI) myocardial infarction: Secondary | ICD-10-CM

## 2022-12-05 DIAGNOSIS — E7800 Pure hypercholesterolemia, unspecified: Secondary | ICD-10-CM | POA: Diagnosis present

## 2022-12-05 DIAGNOSIS — R7303 Prediabetes: Secondary | ICD-10-CM | POA: Diagnosis present

## 2022-12-05 DIAGNOSIS — Z91014 Allergy to mammalian meats: Secondary | ICD-10-CM | POA: Diagnosis not present

## 2022-12-05 DIAGNOSIS — G8929 Other chronic pain: Secondary | ICD-10-CM | POA: Diagnosis present

## 2022-12-05 DIAGNOSIS — E663 Overweight: Secondary | ICD-10-CM | POA: Diagnosis present

## 2022-12-05 DIAGNOSIS — Z79899 Other long term (current) drug therapy: Secondary | ICD-10-CM | POA: Diagnosis not present

## 2022-12-05 DIAGNOSIS — F17210 Nicotine dependence, cigarettes, uncomplicated: Secondary | ICD-10-CM | POA: Diagnosis present

## 2022-12-05 DIAGNOSIS — Z7982 Long term (current) use of aspirin: Secondary | ICD-10-CM | POA: Diagnosis not present

## 2022-12-05 DIAGNOSIS — E876 Hypokalemia: Secondary | ICD-10-CM | POA: Diagnosis present

## 2022-12-05 DIAGNOSIS — Z6825 Body mass index (BMI) 25.0-25.9, adult: Secondary | ICD-10-CM | POA: Diagnosis not present

## 2022-12-05 DIAGNOSIS — K219 Gastro-esophageal reflux disease without esophagitis: Secondary | ICD-10-CM | POA: Diagnosis present

## 2022-12-05 DIAGNOSIS — R072 Precordial pain: Secondary | ICD-10-CM | POA: Diagnosis present

## 2022-12-05 DIAGNOSIS — I2510 Atherosclerotic heart disease of native coronary artery without angina pectoris: Secondary | ICD-10-CM | POA: Diagnosis not present

## 2022-12-05 DIAGNOSIS — I2119 ST elevation (STEMI) myocardial infarction involving other coronary artery of inferior wall: Secondary | ICD-10-CM | POA: Diagnosis not present

## 2022-12-05 DIAGNOSIS — I5022 Chronic systolic (congestive) heart failure: Secondary | ICD-10-CM | POA: Diagnosis present

## 2022-12-05 DIAGNOSIS — R0989 Other specified symptoms and signs involving the circulatory and respiratory systems: Secondary | ICD-10-CM | POA: Diagnosis not present

## 2022-12-05 DIAGNOSIS — R079 Chest pain, unspecified: Secondary | ICD-10-CM | POA: Diagnosis not present

## 2022-12-05 DIAGNOSIS — E871 Hypo-osmolality and hyponatremia: Secondary | ICD-10-CM | POA: Diagnosis present

## 2022-12-05 DIAGNOSIS — Z823 Family history of stroke: Secondary | ICD-10-CM | POA: Diagnosis not present

## 2022-12-05 DIAGNOSIS — S42001A Fracture of unspecified part of right clavicle, initial encounter for closed fracture: Secondary | ICD-10-CM | POA: Diagnosis not present

## 2022-12-05 DIAGNOSIS — I110 Hypertensive heart disease with heart failure: Secondary | ICD-10-CM | POA: Diagnosis present

## 2022-12-05 DIAGNOSIS — J9811 Atelectasis: Secondary | ICD-10-CM | POA: Diagnosis not present

## 2022-12-05 DIAGNOSIS — D72829 Elevated white blood cell count, unspecified: Secondary | ICD-10-CM | POA: Diagnosis present

## 2022-12-05 HISTORY — PX: LEFT HEART CATH AND CORONARY ANGIOGRAPHY: CATH118249

## 2022-12-05 LAB — HEPATIC FUNCTION PANEL
ALT: 23 U/L (ref 0–44)
AST: 55 U/L — ABNORMAL HIGH (ref 15–41)
Albumin: 3.3 g/dL — ABNORMAL LOW (ref 3.5–5.0)
Alkaline Phosphatase: 47 U/L (ref 38–126)
Bilirubin, Direct: 0.2 mg/dL (ref 0.0–0.2)
Indirect Bilirubin: 0.6 mg/dL (ref 0.3–0.9)
Total Bilirubin: 0.8 mg/dL (ref 0.3–1.2)
Total Protein: 6.7 g/dL (ref 6.5–8.1)

## 2022-12-05 LAB — BASIC METABOLIC PANEL WITH GFR
Anion gap: 14 (ref 5–15)
BUN: 8 mg/dL (ref 6–20)
CO2: 25 mmol/L (ref 22–32)
Calcium: 8.7 mg/dL — ABNORMAL LOW (ref 8.9–10.3)
Chloride: 93 mmol/L — ABNORMAL LOW (ref 98–111)
Creatinine, Ser: 0.86 mg/dL (ref 0.61–1.24)
GFR, Estimated: >60 mL/min (ref >60)
Glucose, Bld: 113 mg/dL — ABNORMAL HIGH (ref 70–99)
Potassium: 3 mmol/L — ABNORMAL LOW (ref 3.5–5.1)
Sodium: 132 mmol/L — ABNORMAL LOW (ref 135–145)

## 2022-12-05 LAB — ECHOCARDIOGRAM COMPLETE
Area-P 1/2: 3.12 cm2
Height: 72 in
MV M vel: 0.75 m/s
MV Peak grad: 2.3 mmHg
S' Lateral: 3.2 cm
Weight: 2952.4 [oz_av]

## 2022-12-05 LAB — BRAIN NATRIURETIC PEPTIDE: B Natriuretic Peptide: 157.7 pg/mL — ABNORMAL HIGH (ref 0.0–100.0)

## 2022-12-05 LAB — LIPID PANEL
Cholesterol: 119 mg/dL (ref 0–200)
HDL: 47 mg/dL (ref >40)
LDL Cholesterol: 58 mg/dL (ref 0–99)
Total CHOL/HDL Ratio: 2.5 ratio
Triglycerides: 69 mg/dL (ref <150)
VLDL: 14 mg/dL (ref 0–40)

## 2022-12-05 LAB — MAGNESIUM: Magnesium: 1.9 mg/dL (ref 1.7–2.4)

## 2022-12-05 LAB — MRSA NEXT GEN BY PCR, NASAL: MRSA by PCR Next Gen: NOT DETECTED

## 2022-12-05 LAB — HIV ANTIBODY (ROUTINE TESTING W REFLEX): HIV Screen 4th Generation wRfx: NONREACTIVE

## 2022-12-05 LAB — CARDIAC CATHETERIZATION: Cath EF Quantitative: 45 %

## 2022-12-05 LAB — TROPONIN I (HIGH SENSITIVITY): Troponin I (High Sensitivity): 7848 ng/L (ref <18)

## 2022-12-05 SURGERY — LEFT HEART CATH AND CORONARY ANGIOGRAPHY
Anesthesia: LOCAL

## 2022-12-05 MED ORDER — SODIUM CHLORIDE 0.9 % WEIGHT BASED INFUSION: 1.0000 mL/kg/h | INTRAVENOUS | Status: DC

## 2022-12-05 MED ORDER — ORAL CARE MOUTH RINSE: 15.0000 mL | OROMUCOSAL | Status: DC | PRN

## 2022-12-05 MED ORDER — SODIUM CHLORIDE 0.9% FLUSH: 3.0000 mL | INTRAVENOUS | Status: DC | PRN

## 2022-12-05 MED ORDER — LIDOCAINE HCL (PF) 1 % IJ SOLN
INTRAMUSCULAR | Status: DC | PRN
  Administered 2022-12-05: 2 mL

## 2022-12-05 MED ORDER — OXYCODONE HCL 5 MG PO TABS
20.0000 mg | ORAL_TABLET | Freq: Three times a day (TID) | ORAL | Status: DC | PRN
  Administered 2022-12-05 – 2022-12-06 (×4): 20 mg via ORAL
  Filled 2022-12-05 (×5): qty 4

## 2022-12-05 MED ORDER — MIDAZOLAM HCL 2 MG/2ML IJ SOLN
INTRAMUSCULAR | Status: DC | PRN
  Administered 2022-12-05: 1 mg via INTRAVENOUS

## 2022-12-05 MED ORDER — COLCHICINE 0.6 MG PO TABS
0.6000 mg | ORAL_TABLET | Freq: Two times a day (BID) | ORAL | Status: DC
  Administered 2022-12-05 – 2022-12-06 (×3): 0.6 mg via ORAL
  Filled 2022-12-05 (×3): qty 1

## 2022-12-05 MED ORDER — SODIUM CHLORIDE 0.9 % WEIGHT BASED INFUSION
3.0000 mL/kg/h | INTRAVENOUS | Status: DC
  Administered 2022-12-05: 3 mL/kg/h via INTRAVENOUS

## 2022-12-05 MED ORDER — ONDANSETRON HCL 4 MG/2ML IJ SOLN: 4.0000 mg | Freq: Four times a day (QID) | INTRAMUSCULAR | Status: DC | PRN

## 2022-12-05 MED ORDER — ASPIRIN 81 MG PO CHEW: 81.0000 mg | CHEWABLE_TABLET | ORAL | Status: DC

## 2022-12-05 MED ORDER — LIDOCAINE HCL (PF) 1 % IJ SOLN
INTRAMUSCULAR | Status: AC
  Filled 2022-12-05: qty 30

## 2022-12-05 MED ORDER — VERAPAMIL HCL 2.5 MG/ML IV SOLN
INTRAVENOUS | Status: DC | PRN
  Administered 2022-12-05: 10 mL via INTRA_ARTERIAL

## 2022-12-05 MED ORDER — ACETAMINOPHEN 325 MG PO TABS: 650.0000 mg | ORAL_TABLET | ORAL | Status: DC | PRN

## 2022-12-05 MED ORDER — CLOPIDOGREL BISULFATE 300 MG PO TABS
ORAL_TABLET | ORAL | Status: AC
  Filled 2022-12-05: qty 1

## 2022-12-05 MED ORDER — MORPHINE SULFATE (PF) 4 MG/ML IV SOLN
4.0000 mg | Freq: Once | INTRAVENOUS | Status: AC
  Administered 2022-12-05: 4 mg via INTRAVENOUS
  Filled 2022-12-05: qty 1

## 2022-12-05 MED ORDER — VERAPAMIL HCL 2.5 MG/ML IV SOLN
INTRAVENOUS | Status: AC
  Filled 2022-12-05: qty 2

## 2022-12-05 MED ORDER — IOHEXOL 350 MG/ML SOLN
INTRAVENOUS | Status: DC | PRN
  Administered 2022-12-05: 75 mL

## 2022-12-05 MED ORDER — CLOPIDOGREL BISULFATE 75 MG PO TABS
75.0000 mg | ORAL_TABLET | Freq: Every day | ORAL | Status: DC
  Administered 2022-12-06: 75 mg via ORAL
  Filled 2022-12-05: qty 1

## 2022-12-05 MED ORDER — SODIUM CHLORIDE 0.9 % IV SOLN: INTRAVENOUS | Status: AC

## 2022-12-05 MED ORDER — SODIUM CHLORIDE 0.9 % IV SOLN: 250.0000 mL | INTRAVENOUS | Status: DC | PRN

## 2022-12-05 MED ORDER — HEPARIN BOLUS VIA INFUSION
2000.0000 [IU] | Freq: Once | INTRAVENOUS | Status: AC
  Administered 2022-12-05: 2000 [IU] via INTRAVENOUS
  Filled 2022-12-05: qty 2000

## 2022-12-05 MED ORDER — SODIUM CHLORIDE 0.9% FLUSH
3.0000 mL | Freq: Two times a day (BID) | INTRAVENOUS | Status: DC
  Administered 2022-12-05 – 2022-12-06 (×2): 3 mL via INTRAVENOUS

## 2022-12-05 MED ORDER — SODIUM CHLORIDE 0.9 % IV SOLN
INTRAVENOUS | Status: AC | PRN
  Administered 2022-12-05: 250 mL via INTRAVENOUS

## 2022-12-05 MED ORDER — ATORVASTATIN CALCIUM 40 MG PO TABS
40.0000 mg | ORAL_TABLET | Freq: Every day | ORAL | Status: DC
  Administered 2022-12-05 – 2022-12-06 (×2): 40 mg via ORAL
  Filled 2022-12-05 (×2): qty 1

## 2022-12-05 MED ORDER — POTASSIUM CHLORIDE CRYS ER 20 MEQ PO TBCR
40.0000 meq | EXTENDED_RELEASE_TABLET | ORAL | Status: AC
  Administered 2022-12-05 (×2): 40 meq via ORAL
  Filled 2022-12-05 (×2): qty 2

## 2022-12-05 MED ORDER — FENTANYL CITRATE (PF) 100 MCG/2ML IJ SOLN
INTRAMUSCULAR | Status: DC | PRN
  Administered 2022-12-05: 25 ug via INTRAVENOUS

## 2022-12-05 MED ORDER — HEPARIN SODIUM (PORCINE) 1000 UNIT/ML IJ SOLN
INTRAMUSCULAR | Status: AC
  Filled 2022-12-05: qty 10

## 2022-12-05 MED ORDER — CLOPIDOGREL BISULFATE 300 MG PO TABS
ORAL_TABLET | ORAL | Status: DC | PRN
  Administered 2022-12-05: 300 mg via ORAL

## 2022-12-05 MED ORDER — PERFLUTREN LIPID MICROSPHERE
1.0000 mL | INTRAVENOUS | Status: AC | PRN
  Administered 2022-12-05: 4 mL via INTRAVENOUS

## 2022-12-05 MED ORDER — ASPIRIN 81 MG PO TBEC
81.0000 mg | DELAYED_RELEASE_TABLET | Freq: Every day | ORAL | Status: DC
  Administered 2022-12-05 – 2022-12-06 (×2): 81 mg via ORAL
  Filled 2022-12-05 (×2): qty 1

## 2022-12-05 MED ORDER — FENTANYL CITRATE PF 50 MCG/ML IJ SOSY
25.0000 ug | PREFILLED_SYRINGE | INTRAMUSCULAR | Status: DC | PRN
  Administered 2022-12-05 – 2022-12-06 (×3): 25 ug via INTRAVENOUS
  Filled 2022-12-05 (×3): qty 1

## 2022-12-05 MED ORDER — HYDROMORPHONE HCL 1 MG/ML IJ SOLN
1.0000 mg | Freq: Once | INTRAMUSCULAR | Status: AC
  Administered 2022-12-05: 1 mg via INTRAVENOUS
  Filled 2022-12-05: qty 1

## 2022-12-05 MED ORDER — VALACYCLOVIR HCL 500 MG PO TABS
1000.0000 mg | ORAL_TABLET | Freq: Every day | ORAL | Status: DC
  Administered 2022-12-05 – 2022-12-06 (×2): 1000 mg via ORAL
  Filled 2022-12-05 (×2): qty 2

## 2022-12-05 MED ORDER — HEPARIN SODIUM (PORCINE) 1000 UNIT/ML IJ SOLN
INTRAMUSCULAR | Status: DC | PRN
  Administered 2022-12-05: 4000 [IU] via INTRAVENOUS

## 2022-12-05 MED ORDER — METOPROLOL TARTRATE 12.5 MG HALF TABLET
12.5000 mg | ORAL_TABLET | Freq: Two times a day (BID) | ORAL | Status: DC
  Administered 2022-12-05 – 2022-12-06 (×2): 12.5 mg via ORAL
  Filled 2022-12-05 (×2): qty 1

## 2022-12-05 MED ORDER — FENTANYL CITRATE (PF) 100 MCG/2ML IJ SOLN
INTRAMUSCULAR | Status: AC
  Filled 2022-12-05: qty 2

## 2022-12-05 MED ORDER — MIDAZOLAM HCL 2 MG/2ML IJ SOLN
INTRAMUSCULAR | Status: AC
  Filled 2022-12-05: qty 2

## 2022-12-05 MED ORDER — HEPARIN (PORCINE) IN NACL 1000-0.9 UT/500ML-% IV SOLN
INTRAVENOUS | Status: DC | PRN
  Administered 2022-12-05 (×2): 500 mL

## 2022-12-05 MED ORDER — NITROGLYCERIN IN D5W 200-5 MCG/ML-% IV SOLN
INTRAVENOUS | Status: AC
  Filled 2022-12-05: qty 250

## 2022-12-05 MED ORDER — ENOXAPARIN SODIUM 40 MG/0.4ML IJ SOSY
40.0000 mg | PREFILLED_SYRINGE | INTRAMUSCULAR | Status: DC
  Administered 2022-12-06: 40 mg via SUBCUTANEOUS
  Filled 2022-12-05: qty 0.4

## 2022-12-05 SURGICAL SUPPLY — 9 items
CATH INFINITI 5 FR JL3.5 (CATHETERS) IMPLANT
CATH INFINITI 5FR JK (CATHETERS) IMPLANT
DEVICE RAD COMP TR BAND LRG (VASCULAR PRODUCTS) IMPLANT
GLIDESHEATH SLEND SS 6F .021 (SHEATH) IMPLANT
GUIDEWIRE INQWIRE 1.5J.035X260 (WIRE) IMPLANT
INQWIRE 1.5J .035X260CM (WIRE) ×1 IMPLANT
KIT HEART LEFT (KITS) IMPLANT
PACK CARDIAC CATHETERIZATION (CUSTOM PROCEDURE TRAY) ×1 IMPLANT
TRANSDUCER W/STOPCOCK (MISCELLANEOUS) IMPLANT

## 2022-12-05 NOTE — Progress Notes (Signed)
Patient has arrived via [facility] to 2C06.  Patient a & o x4, continues to c/o cp 7/10, Nitroglycerin gtt at m 90 mcg/hr, heparin infusing.  Oriented to room and plan of care.  Dr. Geraldo Pitter paged and at bedside.

## 2022-12-05 NOTE — H&P (Signed)
Cardiology Admission History and Physical   Patient ID: Corey Hale MRN: [id number]; DOB: [birth year]   Admission date: 12/04/2022  PCP:  Claiborne Rigg, NP    HeartCare Providers Cardiologist:  None        Chief Complaint:  chest pain  Patient Profile:   Corey Hale is a 57 y.o. male with  hypertension, prediabetes, dyslipidemia, GERD, tobacco dependence who is being seen 12/05/2022 for the evaluation of chest pain.  History of Present Illness:   Mr. Corey Hale initially presented to [facility] emergency room for chest discomfort.  States that over the last 3 days he has had waxing and waning chest discomfort.  He thought it was related to gas as his discomfort seem to worsen with food intake.  Consequentially, he has essentially had minimal food or drink over the last 3 or so days.  His recurring chest pain has increased in intensity over the last 2 days which ultimately prompted him to present to the emergency room.  His pain worsens when lying down and improves when sitting upright.  Also worsens with deep inspiration.  When he is standing or walking, his pain actually improves and does not worsen.  He states that he has had similar pain in the past around 4-5 years ago.  He underwent evaluation which revealed that his discomfort was related to "gas pain".  On presentation to the emergency room, afebrile, HR 60s, SBP 105-125 millimeters of mercury, not requiring supplemental oxygen.  CBC unremarkable outside of WBC 15.2.  BMP with NA 127, K 2.4, creatinine 1.04, mag 2.1.  High-sensitivity troponin 8912-> 8232.  EKG with subtle ST elevation in the inferior leads but does not meet STEMI criteria.  He was given aspirin 324 mg, heparin 4000 units, morphine 4 mg, KCl 60 mEq p.o. and 20 mEq IV, famotidine 20 mg and started on a heparin drip and nitroglycerin drip.  Presentation to [facility], he states 5/10 chest discomfort though appears well.  His chest discomfort was  previously at a 1/10 which he feels was attributed to the IV Dilaudid and nitroglycerin drip that he received. Denies any new orthopnea, PND, lower extremity swelling, abdominal fullness.  No recent fevers or chills or sick contacts.  States that he smoked 2-3 cigarettes for multiple years and quit around 2 weeks ago.  States that he had a brother with cardiac disease who passed away in his 22s.  Both his parents had a stroke.  Past Medical History:  Diagnosis Date   Chronic back pain    Chronic sinusitis    nasal polyps, turbinate hypertrophy, deviated septum   Erectile dysfunction    Goiter    Herpes simplex    Hypercholesteremia    Hypertension    Perirectal abscess    Skin abscess    Thyroid disease    Tobacco dependence     Past Surgical History:  Procedure Laterality Date   ARM HARDWARE REMOVAL Right    INCISION AND DRAINAGE PERIRECTAL ABSCESS N/A 07/22/2017   Procedure: IRRIGATION, DEBRIDEMENT AND DRAINAGE PERIRECTAL ABSCESS;  Surgeon: Gaynelle Adu, MD;  Location: [facility] OR;  Service: General;  Laterality: N/A;   NASAL SEPTOPLASTY W/ TURBINOPLASTY Bilateral 11/13/2019   Procedure: NASAL SEPTOPLASTY WITH TURBINATE REDUCTION;  Surgeon: Christia Reading, MD;  Location: [facility] OR;  Service: ENT;  Laterality: Bilateral;   NECK SURGERY     PAROTIDECTOMY     Dr Jenne Pane   SINUS ENDO WITH FUSION Bilateral 11/13/2019  Procedure: SINUS ENDO WITH FUSION;  Surgeon: Christia Reading, MD;  Location: [facility] OR;  Service: ENT;  Laterality: Bilateral;   THYROIDECTOMY, PARTIAL  2008   Dr Carolynne Edouard     Medications Prior to Admission: Prior to Admission medications   Medication Sig Start Date End Date Taking? Authorizing Provider  aspirin 81 MG tablet Take 1 tablet (81 mg total) by mouth daily. 03/07/15  Yes Quentin Angst, MD  atenolol-chlorthalidone (TENORETIC) 50-25 MG tablet Take 1 tablet by mouth daily. 04/17/22  Yes Claiborne Rigg, NP  Oxycodone HCl 20 MG TABS Take 1 tablet by mouth 5 times daily as  needed 12/15/21  Yes   pravastatin (PRAVACHOL) 20 MG tablet Take 1 tablet (20 mg total) by mouth daily. 04/17/22  Yes Claiborne Rigg, NP  valACYclovir (VALTREX) 1000 MG tablet Take 1 tablet (1,000 mg total) by mouth daily. 08/16/22  Yes Claiborne Rigg, NP  gabapentin (NEURONTIN) 600 MG tablet Take 0.5-1 tablets (300-600 mg total) by mouth 3 (three) times daily. 08/16/22   Claiborne Rigg, NP  naloxone [facility]) nasal spray 4 mg/0.1 mL Use 1 (one) spray in the nose as needed as directed on package 07/31/22     Oxycodone HCl 20 MG TABS Take 1 tablet (20 mg total) by mouth 5 (five) times daily as needed.  **Per MD do not fill until 07/05/22.** 07/02/22     Oxycodone HCl 20 MG TABS Take 1 tablet (20 mg) by mouth 5 times daily as needed. (fill 09/03/22) 08/31/22     Oxycodone HCl 20 MG TABS Take 1 Tablet five times a day as needed. 10/02/22     Oxycodone HCl 20 MG TABS Take 1 Tablet five times daily, as needed 11/02/22     Oxycodone HCl 20 MG TABS Take 1 tablet (20 mg total) by mouth 5 (five) times daily as needed. 11/30/22     Oxycodone HCl 20 MG TABS Take 1 Tablet five times daily, as needed 12/04/22        Allergies:    Allergies  Allergen Reactions   Pork-Derived Products     No pork products; pt is a Muslim    Social History:   Social History   Socioeconomic History   Marital status: Married    Spouse name: Not on file   Number of children: Not on file   Years of education: Not on file   Highest education level: Not on file  Occupational History   Not on file  Tobacco Use   Smoking status: Former    Current packs/day: 3.00    Average packs/day: 3.0 packs/day for 22.0 years (66.0 ttl pk-yrs)    Types: Cigarettes   Smokeless tobacco: Never   Tobacco comments:    3 to 4 cigs a day  Vaping Use   Vaping status: Never Used  Substance and Sexual Activity   Alcohol use: No    Alcohol/week: 0.0 standard drinks of alcohol   Drug use: No   Sexual activity: Yes  Other Topics Concern   Not  on file  Social History Narrative   ** Merged History Encounter **       Social Determinants of Health   Financial Resource Strain: Not on file  Food Insecurity: No Food Insecurity (12/05/2022)   Hunger Vital Sign    Worried About Running Out of Food in the Last Year: Never true    Ran Out of Food in the Last Year: Never true  Transportation Needs: No Transportation  Needs (12/05/2022)   PRAPARE - Administrator, Civil Service (Medical): No    Lack of Transportation (Non-Medical): No  Physical Activity: Not on file  Stress: Not on file  Social Connections: Not on file  Intimate Partner Violence: Not At Risk (12/05/2022)   Humiliation, Afraid, Rape, and Kick questionnaire    Fear of Current or Ex-Partner: No    Emotionally Abused: No    Physically Abused: No    Sexually Abused: No    Family History:   The patient's family history includes Stroke in his father and mother.    ROS:  Please see the history of present illness.  All other ROS reviewed and negative.     Physical Exam/Data:   Vitals:   12/05/22 0049 12/05/22 0100 12/05/22 0228 12/05/22 0300  BP: 111/70 105/69 104/67 108/72  Pulse: 71 71  74  Resp: 18 20 20    Temp:   99.1 F (37.3 C)   TempSrc:   Oral   SpO2: 95% 94% 92% 91%  Weight:   83.7 kg   Height:        Intake/Output Summary (Last 24 hours) at 12/05/2022 0336 Last data filed at 12/05/2022 0220 Gross per 24 hour  Intake 329.44 ml  Output --  Net 329.44 ml      12/05/2022    2:28 AM 12/04/2022    8:41 PM 08/16/2022   11:08 AM  Last 3 Weights  Weight (lbs) 184 lb 8.4 oz 190 lb 196 lb 12.8 oz  Weight (kg) 83.7 kg 86.183 kg 89.268 kg     Body mass index is 25.03 kg/m.  General:  Well nourished, well developed, in no acute distress HEENT: normal Neck: no JVD Vascular: No carotid bruits; Distal pulses 2+ bilaterally   Cardiac:  normal S1, S2; RRR; no murmur  Lungs:  clear to auscultation bilaterally, no wheezing, rhonchi or rales  Abd:  soft, nontender, no hepatomegaly  Ext: no edema Musculoskeletal:  No deformities, BUE and BLE strength normal and equal Skin: warm and dry  Neuro:  CNs 2-12 intact, no focal abnormalities noted Psych:  Normal affect    EKG:  The ECG that was done 12/04/22 was personally reviewed and demonstrates  NSR and  <65mm STE in the inferior leads   Relevant CV Studies: None previously  Laboratory Data:  High Sensitivity Troponin:   Recent Labs  Lab 12/04/22 2048 12/04/22 2248  TROPONINIHS 8,912* 8,232*      Chemistry Recent Labs  Lab 12/04/22 2048  NA 127*  K 2.4*  CL 88*  CO2 28  GLUCOSE 130*  BUN 12  CREATININE 1.04  CALCIUM 8.8*  MG 2.1  GFRNONAA >60  ANIONGAP 11    No results for input(s): "PROT", "ALBUMIN", "AST", "ALT", "ALKPHOS", "BILITOT" in the last 168 hours. Lipids No results for input(s): "CHOL", "TRIG", "HDL", "LABVLDL", "LDLCALC", "CHOLHDL" in the last 168 hours. Hematology Recent Labs  Lab 12/04/22 2048  WBC 15.2*  RBC 4.64  HGB 13.4  HCT 39.8  MCV 85.8  MCH 28.9  MCHC 33.7  RDW 13.3  PLT 208   Thyroid No results for input(s): "TSH", "FREET4" in the last 168 hours. BNPNo results for input(s): "BNP", "PROBNP" in the last 168 hours.  DDimer  Recent Labs  Lab 12/04/22 2048  DDIMER 0.63*     Radiology/Studies:  DG Chest 2 View  Result Date: 12/04/2022 CLINICAL DATA:  Chest pain for 2 days then. Chest pain came back tonight around  dinnertime. EXAM: CHEST - 2 VIEW COMPARISON:  Radiographs 08/21/2019 FINDINGS: Low lung volumes accentuate cardiomediastinal silhouette and pulmonary vascularity. Bibasilar atelectasis. Aortic atherosclerotic calcification. Remote right clavicle fracture. Chronic compression of mid and lower thoracic vertebral bodies. IMPRESSION: Low lung volumes with basilar atelectasis. Electronically Signed   By: Minerva Fester M.D.   On: 12/04/2022 21:24     Assessment and Plan:   #NSTEMI, type I Presenting with waxing and waning  chest discomfort, EKG changes consistent with ischemia and significantly elevated troponins consistent with type I NSTEMI.  Has multiple risk factors including hypertension, hyperlipidemia and tobacco use history.  Has had no previous ischemic evaluation.  No recent echo.  Still having chest discomfort though troponins are downtrending now.  Needs an ischemic evaluation.  Interestingly, his chest pain symptoms are not "typical" of ischemic chest pain.  His symptoms are worse when lying down and improved when sitting forward and he has a pleuritic component which can be seen in pericarditis though would not explain his acute myocardial injury.  EKG also not consistent with pericarditis.  Myopericarditis also possible, though euvolemic on exam and no recent illness preceding.  D-dimer is mildly positive though he is not tachycardic and does not have an oxygen requirement. - Continue to trend troponins - Obtain BNP, lipid panel, lipoprotein a, HFP - Obtain echo - IV heparin per pharmacy - Status post aspirin 324 mg, 81 mg daily going forward - IV nitroglycerin drip titrated to chest pain-free - Home opioids plus as needed IV sent home for chest pain relief - Metoprolol tartrate 12.5 mg twice daily - atorvastatin 40mg  - N.p.o. for likely coronary angiogram in AM  # Acute Hyponatremia Suspect hypovolemic hyponatremia secondary to poor p.o. intake.  Status post IVF in the emergency room prior to transfer. - repeat BMP  #Hypokalemia May be related to poor p.o. intake.  No GI losses recently.  No potassium wasting medications at baseline. S/p supplementation - repeat BMP  #Hypertension #Prediabetes #Dyslipidemia - hold home antihypertensive with nitroglycerin gtt - high intensity statin  # Chronic back pain - Continue home oxycodone 20 mg 3 times daily as needed  Risk Assessment/Risk Scores:    TIMI Risk Score for Unstable Angina or Non-ST Elevation MI:   The patient's TIMI risk score is 4,  which indicates a 20% risk of all cause mortality, new or recurrent myocardial infarction or need for urgent revascularization in the next 14 days.      Code Status: Full Code  Severity of Illness: The appropriate patient status for this patient is INPATIENT. Inpatient status is judged to be reasonable and necessary in order to provide the required intensity of service to ensure the patient's safety. The patient's presenting symptoms, physical exam findings, and initial radiographic and laboratory data in the context of their chronic comorbidities is felt to place them at high risk for further clinical deterioration. Furthermore, it is not anticipated that the patient will be medically stable for discharge from the hospital within 2 midnights of admission.   * I certify that at the point of admission it is my clinical judgment that the patient will require inpatient hospital care spanning beyond 2 midnights from the point of admission due to high intensity of service, high risk for further deterioration and high frequency of surveillance required.*   For questions or updates, please contact [facility] Please consult [email or web address] for contact info under     Signed, Aundra Dubin, MD  12/05/2022 3:36 AM

## 2022-12-05 NOTE — Progress Notes (Signed)
ANTICOAGULATION CONSULT NOTE- Follow Up  Pharmacy Consult for IV heparin Indication: chest pain/ACS  Allergies  Allergen Reactions   Pork-Derived Products     No pork products; pt is a Muslim    Patient Measurements: Height: 6' (182.9 cm) Weight: 83.7 kg (184 lb 8.4 oz) IBW/kg (Calculated) : 77.6 Heparin Dosing Weight: 86.2  Vital Signs: Temp: 99.1 F (37.3 C) (09/18 0228) Temp Source: Oral (09/18 0228) BP: 105/69 (09/18 0658) Pulse Rate: 72 (09/18 0658)  Labs: Recent Labs    12/04/22 2048 12/04/22 2248 12/05/22 0600  HGB 13.4  --  12.2*  HCT 39.8  --  36.5*  PLT 208  --  179  HEPARINUNFRC  --   --  0.12*  CREATININE 1.04  --   --   TROPONINIHS 7,253* 8,232*  --     Estimated Creatinine Clearance: 86 mL/min (by C-G formula based on SCr of 1.04 mg/dL).   Medical History: Past Medical History:  Diagnosis Date   Chronic back pain    Chronic sinusitis    nasal polyps, turbinate hypertrophy, deviated septum   Erectile dysfunction    Goiter    Herpes simplex    Hypercholesteremia    Hypertension    Perirectal abscess    Skin abscess    Thyroid disease    Tobacco dependence     Assessment: Corey Hale is a 57 y.o. year old male admitted on 12/04/2022 with concern for NSTEMI. No anticoagulation prior to admission. Of note, pt has note in chart to avoid pork-derived products but per MD was agreeable to starting heparin. Pharmacy consulted to dose heparin.  9/18 AM: heparin level returned at 0.12 on 1000 units/hr (subtherapeutic). Per RN, no issues with the heparin infusion running continuously or signs/symptoms of bleeding. Hgb and plts slight decrease.  Goal of Therapy:  Heparin level 0.3-0.7 units/ml Monitor platelets by anticoagulation protocol: Yes   Plan:  Bolus heparin 2000 units x1 Increase heparin infusion to 1250 units/hr 6 heparin level  Daily heparin level, CBC, and monitoring for bleeding F/u plans for anticoagulation   Thank you for  allowing pharmacy to participate in this patient's care.  Arabella Merles, PharmD. Clinical Pharmacist 12/05/2022 7:04 AM

## 2022-12-05 NOTE — Interval H&P Note (Signed)
History and Physical Interval Note:  12/05/2022 8:58 AM  Corey Hale  has presented today for surgery, with the diagnosis of nstemi.  The various methods of treatment have been discussed with the patient and family. After consideration of risks, benefits and other options for treatment, the patient has consented to  Procedure(s): LEFT HEART CATH AND CORONARY ANGIOGRAPHY (N/A) as a surgical intervention.  The patient's history has been reviewed, patient examined, no change in status, stable for surgery.  I have reviewed the patient's chart and labs.  Questions were answered to the patient's satisfaction.     Lorine Bears

## 2022-12-05 NOTE — ED Notes (Signed)
[facility] called for transport @ 00:18 No Current ETA.Marland KitchenMarland Kitchen

## 2022-12-05 NOTE — ED Notes (Signed)
NTG withheld for 0055 dose due to systolic blood pressure holding at 98.

## 2022-12-05 NOTE — ED Notes (Signed)
[facility] on site to transport patient to [facility]

## 2022-12-05 NOTE — Progress Notes (Signed)
Cardiology Note:   See full H and P from 3:40 am.  Pt admitted with NSTEMI. Troponin [id number]. Most recent with slight trend down to 7848. EKG with subtle ST abnormality.  Chest pain has mostly resolved, now just mild pressure when he lays back.  Will plan early cardiac cath today. Cath scheduling alerted that he needs to go early this morning.   Verne Carrow, MD 12/05/2022 7:34 AM
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
## 2022-12-05 NOTE — ED Notes (Addendum)
NTG to hold @ 101mcg/hr due to systolic blood pressure of 95. Patient beginning to become pain free at this time. Pain 1/10.

## 2022-12-05 NOTE — ED Notes (Signed)
ED TO INPATIENT HANDOFF REPORT  ED Nurse Name and Phone #: [facility] Paramedic [phone]  S Name/Age/Gender Corey Hale 57 y.o. male Room/Bed: MH07/MH07  Code Status   Code Status: Prior  Home/SNF/Other Home Patient oriented to: self, place, time, and situation Is this baseline? Yes   Triage Complete: Triage complete  Chief Complaint NSTEMI (non-ST elevated myocardial infarction) (HCC) [I21.4]  Triage Note Pt c/o mid-sternal chest pain x 3 days, intermittently, pain with inhalation but denies SOB, n/v, dizziness.   Allergies Allergies  Allergen Reactions   Pork-Derived Products     No pork products; pt is a Muslim    Level of Care/Admitting Diagnosis ED Disposition     ED Disposition  Admit   Condition  --   Comment  Hospital Area: [facility] [[id number]]  Level of Care: Telemetry Cardiac [103]  May admit patient to [facility] or [facility] if equivalent level of care is available:: No  Interfacility transfer: Yes  Covid Evaluation: Asymptomatic - no recent exposure (last 10 days) testing not required  Diagnosis: NSTEMI (non-ST elevated myocardial infarction) [facility]) [829562]  Admitting Physician: Aundra Dubin [[id number]]  Attending Physician: Aundra Dubin [[id number]]  Certification:: I certify this patient will need inpatient services for at least 2 midnights  Expected Medical Readiness: 12/11/2022          B Medical/Surgery History Past Medical History:  Diagnosis Date   Chronic back pain    Chronic sinusitis    nasal polyps, turbinate hypertrophy, deviated septum   Erectile dysfunction    Goiter    Herpes simplex    Hypercholesteremia    Hypertension    Perirectal abscess    Skin abscess    Thyroid disease    Tobacco dependence    Past Surgical History:  Procedure Laterality Date   ARM HARDWARE REMOVAL Right    INCISION AND DRAINAGE PERIRECTAL ABSCESS N/A 07/22/2017   Procedure: IRRIGATION, DEBRIDEMENT AND  DRAINAGE PERIRECTAL ABSCESS;  Surgeon: Gaynelle Adu, MD;  Location: [facility] OR;  Service: General;  Laterality: N/A;   NASAL SEPTOPLASTY W/ TURBINOPLASTY Bilateral 11/13/2019   Procedure: NASAL SEPTOPLASTY WITH TURBINATE REDUCTION;  Surgeon: Christia Reading, MD;  Location: [facility] OR;  Service: ENT;  Laterality: Bilateral;   NECK SURGERY     PAROTIDECTOMY     Dr Jenne Pane   SINUS ENDO WITH FUSION Bilateral 11/13/2019   Procedure: SINUS ENDO WITH FUSION;  Surgeon: Christia Reading, MD;  Location: [facility] OR;  Service: ENT;  Laterality: Bilateral;   THYROIDECTOMY, PARTIAL  2008   Dr Stephens November IV Location/Drains/Wounds Patient Lines/Drains/Airways Status     Active Line/Drains/Airways     Name Placement date Placement time Site Days   Peripheral IV 12/04/22 20 G Right Antecubital 12/04/22  2049  Antecubital  1   Peripheral IV 12/04/22 20 G Anterior;Right Forearm 12/04/22  2207  Forearm  1   Incision (Closed) 11/13/19 Nose Other (Comment) 11/13/19  0829  -- 1118            Intake/Output Last 24 hours  Intake/Output Summary (Last 24 hours) at 12/05/2022 0005 Last data filed at 12/04/2022 2323 Gross per 24 hour  Intake 92.21 ml  Output --  Net 92.21 ml    Labs/Imaging Results for orders placed or performed during the hospital encounter of 12/04/22 (from the past 48 hour(s))  Basic metabolic panel     Status: Abnormal   Collection Time: 12/04/22  8:48 PM  Result Value Ref Range   Sodium 127 (L) 135 - 145 mmol/L   Potassium 2.4 (LL) 3.5 - 5.1 mmol/L    Comment: CRITICAL RESULT CALLED TO, READ BACK BY AND VERIFIED WITH A. DILLARD RN 2110 12/04/22 MB   Chloride 88 (L) 98 - 111 mmol/L   CO2 28 22 - 32 mmol/L   Glucose, Bld 130 (H) 70 - 99 mg/dL    Comment: Glucose reference range applies only to samples taken after fasting for at least 8 hours.   BUN 12 6 - 20 mg/dL   Creatinine, Ser 1.61 0.61 - 1.24 mg/dL   Calcium 8.8 (L) 8.9 - 10.3 mg/dL   GFR, Estimated >09 >60 mL/min    Comment:  (NOTE) Calculated using the CKD-EPI Creatinine Equation (2021)    Anion gap 11 5 - 15    Comment: Performed at [facility], [address] [place], [place] [address]  CBC     Status: Abnormal   Collection Time: 12/04/22  8:48 PM  Result Value Ref Range   WBC 15.2 (H) 4.0 - 10.5 K/uL   RBC 4.64 4.22 - 5.81 MIL/uL   Hemoglobin 13.4 13.0 - 17.0 g/dL   HCT 81.1 91.4 - 78.2 %   MCV 85.8 80.0 - 100.0 fL   MCH 28.9 26.0 - 34.0 pg   MCHC 33.7 30.0 - 36.0 g/dL   RDW 95.6 21.3 - 08.6 %   Platelets 208 150 - 400 K/uL   nRBC 0.0 0.0 - 0.2 %    Comment: Performed at [facility], [address] [place], [place] [address]  Troponin I (High Sensitivity)     Status: Abnormal   Collection Time: 12/04/22  8:48 PM  Result Value Ref Range   Troponin I (High Sensitivity) 8,912 (HH) <18 ng/L    Comment: CRITICAL RESULT CALLED TO, READ BACK BY AND VERIFIED WITH ALFRED DILLARD RN AT 2157 ON 12/04/22 BY I.[email or web address] (NOTE) Elevated high sensitivity troponin I (hsTnI) values and significant  changes across serial measurements may suggest ACS but many other  chronic and acute conditions are known to elevate hsTnI results.  Refer to the "Links" section for chest pain algorithms and additional  guidance. Performed at [facility], [address] [place], [place] [address]   Magnesium     Status: None   Collection Time: 12/04/22  8:48 PM  Result Value Ref Range   Magnesium 2.1 1.7 - 2.4 mg/dL    Comment: Performed at [facility], [address] [place], [place] [address]  D-dimer, quantitative     Status: Abnormal   Collection Time: 12/04/22  8:48 PM  Result Value Ref Range   D-Dimer, Quant 0.63 (H) 0.00 - 0.50 ug/mL-FEU    Comment: (NOTE) At the manufacturer cut-off value of 0.5 g/mL FEU, this assay has a negative predictive value of 95-100%.This assay is intended for use in conjunction with a clinical pretest probability (PTP) assessment model to exclude  pulmonary embolism (PE) and deep venous thrombosis (DVT) in outpatients suspected of PE or DVT. Results should be correlated with clinical presentation. Performed at [facility], [address] [place], [place] [address]   Troponin I (High Sensitivity)     Status: Abnormal   Collection Time: 12/04/22 10:48 PM  Result Value Ref Range   Troponin I (High Sensitivity) 8,232 (HH) <18 ng/L    Comment: CRITICAL RESULT CALLED TO, READ BACK BY  AND VERIFIED WITH CALLED TO ALFRED DILLARD RN 2324 12/04/22 BY KENYATTA BOWMAN DELTA CHECK NOTED (NOTE) Elevated high sensitivity troponin I (hsTnI) values and significant  changes across serial measurements may suggest ACS but many other  chronic and acute conditions are known to elevate hsTnI results.  Refer to the "Links" section for chest pain algorithms and additional  guidance. Performed at [facility], [address], [place], [place] [address]    DG Chest 2 View  Result Date: 12/04/2022 CLINICAL DATA:  Chest pain for 2 days then. Chest pain came back tonight around dinnertime. EXAM: CHEST - 2 VIEW COMPARISON:  Radiographs 08/21/2019 FINDINGS: Low lung volumes accentuate cardiomediastinal silhouette and pulmonary vascularity. Bibasilar atelectasis. Aortic atherosclerotic calcification. Remote right clavicle fracture. Chronic compression of mid and lower thoracic vertebral bodies. IMPRESSION: Low lung volumes with basilar atelectasis. Electronically Signed   By: Minerva Fester M.D.   On: 12/04/2022 21:24    Pending Labs Unresulted Labs (From admission, onward)     Start     Ordered   12/06/22 0500  Heparin level (unfractionated)  Daily,   R      12/04/22 2342   12/05/22 0500  CBC  Daily,   R      12/04/22 2342   12/05/22 0430  Heparin level (unfractionated)  Once-Timed,   URGENT        12/04/22 2342            Vitals/Pain Today's Vitals   12/04/22 2350 12/04/22 2357 12/05/22 0000 12/05/22 0004  BP: 102/74  106/72 109/75   Pulse: 73 71 70   Resp: 17 (!) 21 18   Temp:      SpO2: 95% 95% 95%   Weight:      Height:      PainSc:    5     Isolation Precautions No active isolations  Medications Medications  potassium chloride 10 mEq in 100 mL IVPB (10 mEq Intravenous New Bag/Given 12/04/22 2324)  nitroGLYCERIN 50 mg in dextrose 5 % 250 mL (0.2 mg/mL) infusion (50 mcg/min Intravenous Rate/Dose Change 12/05/22 0003)  heparin ADULT infusion 100 units/mL (25000 units/210mL) (1,000 Units/hr Intravenous New Bag/Given 12/04/22 2241)  famotidine (PEPCID) tablet 20 mg (20 mg Oral Given 12/04/22 2235)  potassium chloride SA (KLOR-CON M) CR tablet 60 mEq (60 mEq Oral Given 12/04/22 2235)  aspirin chewable tablet 324 mg (324 mg Oral Given 12/04/22 2208)  morphine (PF) 4 MG/ML injection 4 mg (4 mg Intravenous Given 12/04/22 2209)  0.9 %  sodium chloride infusion ( Intravenous New Bag/Given 12/04/22 2224)  heparin bolus via infusion 4,000 Units (4,000 Units Intravenous Bolus from Bag 12/04/22 2241)    Mobility walks     Focused Assessments Cardiac Assessment Handoff:  Cardiac Rhythm: Normal sinus rhythm No results found for: "CKTOTAL", "CKMB", "CKMBINDEX", "TROPONINI" Lab Results  Component Value Date   DDIMER 0.63 (H) 12/04/2022   Does the Patient currently have chest pain? Yes    R Recommendations: See Admitting Provider Note  Report given to:   Additional Notes: Patient on NTG drip, Heparin, Potassium, NS @125 /hr

## 2022-12-05 NOTE — Progress Notes (Signed)
  Echocardiogram 2D Echocardiogram has been performed.  Corey Hale 12/05/2022, 2:07 PM

## 2022-12-06 ENCOUNTER — Telehealth: Payer: Self-pay | Admitting: Cardiology

## 2022-12-06 ENCOUNTER — Other Ambulatory Visit (HOSPITAL_COMMUNITY): Payer: Self-pay

## 2022-12-06 ENCOUNTER — Encounter (HOSPITAL_COMMUNITY): Payer: Self-pay | Admitting: Cardiovascular Disease

## 2022-12-06 DIAGNOSIS — I214 Non-ST elevation (NSTEMI) myocardial infarction: Secondary | ICD-10-CM | POA: Diagnosis not present

## 2022-12-06 LAB — CBC
HCT: 36.3 % — ABNORMAL LOW (ref 39.0–52.0)
Hemoglobin: 12.1 g/dL — ABNORMAL LOW (ref 13.0–17.0)
MCH: 28.7 pg (ref 26.0–34.0)
MCHC: 33.3 g/dL (ref 30.0–36.0)
MCV: 86 fL (ref 80.0–100.0)
Platelets: 170 10*3/uL (ref 150–400)
RBC: 4.22 MIL/uL (ref 4.22–5.81)
RDW: 13.7 % (ref 11.5–15.5)
WBC: 10.6 10*3/uL — ABNORMAL HIGH (ref 4.0–10.5)
nRBC: 0 % (ref 0.0–0.2)

## 2022-12-06 LAB — BASIC METABOLIC PANEL
Anion gap: 10 (ref 5–15)
BUN: 5 mg/dL — ABNORMAL LOW (ref 6–20)
CO2: 25 mmol/L (ref 22–32)
Calcium: 8.7 mg/dL — ABNORMAL LOW (ref 8.9–10.3)
Chloride: 98 mmol/L (ref 98–111)
Creatinine, Ser: 0.68 mg/dL (ref 0.61–1.24)
GFR, Estimated: >60 mL/min (ref >60)
Glucose, Bld: 100 mg/dL — ABNORMAL HIGH (ref 70–99)
Potassium: 3.3 mmol/L — ABNORMAL LOW (ref 3.5–5.1)
Sodium: 133 mmol/L — ABNORMAL LOW (ref 135–145)

## 2022-12-06 LAB — LIPOPROTEIN A (LPA): Lipoprotein (a): 63.4 nmol/L — ABNORMAL HIGH (ref <75.0)

## 2022-12-06 MED ORDER — ATORVASTATIN CALCIUM 40 MG PO TABS
40.0000 mg | ORAL_TABLET | Freq: Every day | ORAL | 1 refills | Status: DC
Start: 2022-12-07 — End: 2023-04-15
  Filled 2022-12-06: qty 90, 90d supply, fill #0
  Filled 2023-02-26: qty 90, 90d supply, fill #1

## 2022-12-06 MED ORDER — METOPROLOL TARTRATE 25 MG PO TABS
12.5000 mg | ORAL_TABLET | Freq: Two times a day (BID) | ORAL | 0 refills | Status: DC
Start: 2022-12-06 — End: 2023-04-15
  Filled 2022-12-06: qty 90, 90d supply, fill #0
  Filled 2023-01-17: qty 90, 90d supply, fill #1
  Filled 2023-01-21: qty 30, 30d supply, fill #1
  Filled 2023-02-16: qty 30, 30d supply, fill #2
  Filled 2023-03-21: qty 30, 30d supply, fill #3

## 2022-12-06 MED ORDER — CLOPIDOGREL BISULFATE 75 MG PO TABS
75.0000 mg | ORAL_TABLET | Freq: Every day | ORAL | 2 refills | Status: DC
  Filled 2022-12-06: qty 90, 90d supply, fill #0
  Filled 2023-02-26: qty 90, 90d supply, fill #1
  Filled 2023-03-12 – 2023-05-31 (×3): qty 90, 90d supply, fill #2

## 2022-12-06 MED ORDER — POTASSIUM CHLORIDE CRYS ER 20 MEQ PO TBCR
40.0000 meq | EXTENDED_RELEASE_TABLET | Freq: Once | ORAL | Status: AC
  Administered 2022-12-06: 40 meq via ORAL
  Filled 2022-12-06: qty 2

## 2022-12-06 MED ORDER — NITROGLYCERIN 0.4 MG SL SUBL
0.4000 mg | SUBLINGUAL_TABLET | SUBLINGUAL | 2 refills | Status: DC | PRN
  Filled 2022-12-06: qty 25, 8d supply, fill #0

## 2022-12-06 MED ORDER — COLCHICINE 0.6 MG PO TABS
0.6000 mg | ORAL_TABLET | Freq: Two times a day (BID) | ORAL | 0 refills | Status: DC
  Filled 2022-12-06: qty 28, 14d supply, fill #0

## 2022-12-06 NOTE — Plan of Care (Signed)
  Problem: Education: Goal: Knowledge of General Education information will improve Description: Including pain rating scale, medication(s)/side effects and non-pharmacologic comfort measures Outcome: Progressing   Problem: Health Behavior/Discharge Planning: Goal: Ability to manage health-related needs will improve Outcome: Progressing   Problem: Clinical Measurements: Goal: Ability to maintain clinical measurements within normal limits will improve Outcome: Progressing Goal: Will remain free from infection Outcome: Progressing Goal: Diagnostic test results will improve Outcome: Progressing Goal: Respiratory complications will improve Outcome: Progressing Goal: Cardiovascular complication will be avoided Outcome: Progressing   Problem: Activity: Goal: Risk for activity intolerance will decrease Outcome: Progressing   Problem: Nutrition: Goal: Adequate nutrition will be maintained Outcome: Progressing   Problem: Coping: Goal: Level of anxiety will decrease Outcome: Progressing   Problem: Elimination: Goal: Will not experience complications related to bowel motility Outcome: Progressing Goal: Will not experience complications related to urinary retention Outcome: Progressing   Problem: Pain Managment: Goal: General experience of comfort will improve Outcome: Progressing   Problem: Safety: Goal: Ability to remain free from injury will improve Outcome: Progressing   Problem: Skin Integrity: Goal: Risk for impaired skin integrity will decrease Outcome: Progressing   Problem: Education: Goal: Understanding of cardiac disease, CV risk reduction, and recovery process will improve Outcome: Progressing Goal: Individualized Educational Video(s) Outcome: Progressing   Problem: Activity: Goal: Ability to tolerate increased activity will improve Outcome: Progressing   Problem: Cardiac: Goal: Ability to achieve and maintain adequate cardiovascular perfusion will  improve Outcome: Progressing   Problem: Health Behavior/Discharge Planning: Goal: Ability to safely manage health-related needs after discharge will improve Outcome: Progressing   Problem: Education: Goal: Understanding of cardiac disease, CV risk reduction, and recovery process will improve Outcome: Progressing Goal: Individualized Educational Video(s) Outcome: Progressing   Problem: Activity: Goal: Ability to tolerate increased activity will improve Outcome: Progressing   Problem: Cardiac: Goal: Ability to achieve and maintain adequate cardiovascular perfusion will improve Outcome: Progressing   Problem: Health Behavior/Discharge Planning: Goal: Ability to safely manage health-related needs after discharge will improve Outcome: Progressing   Problem: Education: Goal: Understanding of CV disease, CV risk reduction, and recovery process will improve Outcome: Progressing Goal: Individualized Educational Video(s) Outcome: Progressing   Problem: Activity: Goal: Ability to return to baseline activity level will improve Outcome: Progressing   Problem: Cardiovascular: Goal: Ability to achieve and maintain adequate cardiovascular perfusion will improve Outcome: Progressing Goal: Vascular access site(s) Level 0-1 will be maintained Outcome: Progressing   Problem: Health Behavior/Discharge Planning: Goal: Ability to safely manage health-related needs after discharge will improve Outcome: Progressing

## 2022-12-06 NOTE — Progress Notes (Signed)
CARDIAC REHAB PHASE I   PRE:  Rate/Rhythm: 88 NSR  BP:  Sitting: 101/67      SaO2: 96 RA  MODE:  Ambulation: 370 ft   AD:   no AD  POST:  Rate/Rhythm: 98 NSR  BP:  Sitting: 124/80      SaO2: 96 RA  Pt ambulated with supervision assistance, pt walked well only c/p of minor chest soreness during walk. Pt was returned to room and educated.   Pt was educated on Antiplatelet and ASA use, wt restrictions, no baths/daily wash-ups, s/s of infection, ex guidelines, s/s to stop exercising, NTG use and calling 911, heart healthy diet, risk factors and CRPII. Pt received MI book and  materials on exercise, diet, and CRPII. Will refer to [facility].   Pt denies questions.    Faustino Congress  MS, ACSM-CEP 8:45 AM 12/06/2022    Service time is from 0820 to 0845.

## 2022-12-06 NOTE — Discharge Summary (Addendum)
Discharge Summary    Patient ID: Corey Hale MRN: [id number]; DOB: [birth year]  Admit date: 12/04/2022 Discharge date: 12/06/2022  PCP:  Claiborne Rigg, NP    HeartCare Providers Cardiologist:  Verne Carrow, MD     Discharge Diagnoses    Principal Problem:   NSTEMI (non-ST elevated myocardial infarction) [facility]) Active Problems:   Tobacco abuse   Essential hypertension   Hyperlipidemia  Diagnostic Studies/Procedures    Cath: 12/05/2022    Prox RCA lesion is 30% stenosed.   Dist RCA lesion is 100% stenosed.   Mid LAD lesion is 30% stenosed.   Dist LAD lesion is 20% stenosed.   There is mild left ventricular systolic dysfunction.   LV end diastolic pressure is normal.   The left ventricular ejection fraction is 55-65% by visual estimate.   1.  The coronary arteries are ectatic with severe one-vessel coronary artery disease.  Occluded mid to distal right coronary artery with large thrombus burden and faint left-to-right collaterals. 2.  Mildly reduced LV systolic function with an EF of 40 to 45% with inferior wall hypokinesis.  Normal left ventricular end-diastolic pressure.   Recommendations: This is a late presenting inferior STEMI and onset of symptoms is 4 days ago.  Due to large thrombus, ectatic and tortuous vessel, risks of PCI outweighed the benefit especially in the setting of left-to-right collaterals.  Recommend medical therapy. 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 I added clopidogrel to be used for 1 year. I also added colchicine to be used for few weeks. Wean off nitroglycerin drip.   Diagnostic Dominance: Co-dominant   Echo: 12/05/2022  IMPRESSIONS     1. Left ventricular ejection fraction, by estimation, is 60 to 65%. The  left ventricle has normal function. The left ventricle has no regional  wall motion abnormalities. Left ventricular diastolic parameters are  consistent  with Grade II diastolic  dysfunction (pseudonormalization). There is mild hypokinesis of the left  ventricular, basal inferoseptal wall, inferior wall and inferolateral  wall.   2. Right ventricular systolic function is normal. The right ventricular  size is normal. There is normal pulmonary artery systolic pressure.   3. Left atrial size was mildly dilated.   4. The mitral valve is normal in structure. No evidence of mitral valve  regurgitation. No evidence of mitral stenosis.   5. The aortic valve is normal in structure. Aortic valve regurgitation is  not visualized. No aortic stenosis is present.   6. The inferior vena cava is normal in size with greater than 50%  respiratory variability, suggesting right atrial pressure of 3 mmHg.   FINDINGS   Left Ventricle: Left ventricular ejection fraction, by estimation, is 60  to 65%. The left ventricle has normal function. The left ventricle has no  regional wall motion abnormalities. Mild hypokinesis of the left  ventricular, basal inferoseptal wall,  inferior wall and inferolateral wall. Definity contrast agent was given IV  to delineate the left ventricular endocardial borders. The left  ventricular internal cavity size was normal in size. There is no left  ventricular hypertrophy. Left ventricular  diastolic parameters are consistent with Grade II diastolic dysfunction  (pseudonormalization).   Right Ventricle: The right ventricular size is normal. No increase in  right ventricular wall thickness. Right ventricular systolic function is  normal. There is normal pulmonary artery systolic pressure. The tricuspid  regurgitant velocity is 1.16 m/s, and   with an assumed right  Discharge Summary    Patient ID: Corey Hale MRN: [id number]; DOB: [birth year]  Admit date: 12/04/2022 Discharge date: 12/06/2022  PCP:  Claiborne Rigg, NP    HeartCare Providers Cardiologist:  Verne Carrow, MD     Discharge Diagnoses    Principal Problem:   NSTEMI (non-ST elevated myocardial infarction) [facility]) Active Problems:   Tobacco abuse   Essential hypertension   Hyperlipidemia  Diagnostic Studies/Procedures    Cath: 12/05/2022    Prox RCA lesion is 30% stenosed.   Dist RCA lesion is 100% stenosed.   Mid LAD lesion is 30% stenosed.   Dist LAD lesion is 20% stenosed.   There is mild left ventricular systolic dysfunction.   LV end diastolic pressure is normal.   The left ventricular ejection fraction is 55-65% by visual estimate.   1.  The coronary arteries are ectatic with severe one-vessel coronary artery disease.  Occluded mid to distal right coronary artery with large thrombus burden and faint left-to-right collaterals. 2.  Mildly reduced LV systolic function with an EF of 40 to 45% with inferior wall hypokinesis.  Normal left ventricular end-diastolic pressure.   Recommendations: This is a late presenting inferior STEMI and onset of symptoms is 4 days ago.  Due to large thrombus, ectatic and tortuous vessel, risks of PCI outweighed the benefit especially in the setting of left-to-right collaterals.  Recommend medical therapy. 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 I added clopidogrel to be used for 1 year. I also added colchicine to be used for few weeks. Wean off nitroglycerin drip.   Diagnostic Dominance: Co-dominant   Echo: 12/05/2022  IMPRESSIONS     1. Left ventricular ejection fraction, by estimation, is 60 to 65%. The  left ventricle has normal function. The left ventricle has no regional  wall motion abnormalities. Left ventricular diastolic parameters are  consistent  with Grade II diastolic  dysfunction (pseudonormalization). There is mild hypokinesis of the left  ventricular, basal inferoseptal wall, inferior wall and inferolateral  wall.   2. Right ventricular systolic function is normal. The right ventricular  size is normal. There is normal pulmonary artery systolic pressure.   3. Left atrial size was mildly dilated.   4. The mitral valve is normal in structure. No evidence of mitral valve  regurgitation. No evidence of mitral stenosis.   5. The aortic valve is normal in structure. Aortic valve regurgitation is  not visualized. No aortic stenosis is present.   6. The inferior vena cava is normal in size with greater than 50%  respiratory variability, suggesting right atrial pressure of 3 mmHg.   FINDINGS   Left Ventricle: Left ventricular ejection fraction, by estimation, is 60  to 65%. The left ventricle has normal function. The left ventricle has no  regional wall motion abnormalities. Mild hypokinesis of the left  ventricular, basal inferoseptal wall,  inferior wall and inferolateral wall. Definity contrast agent was given IV  to delineate the left ventricular endocardial borders. The left  ventricular internal cavity size was normal in size. There is no left  ventricular hypertrophy. Left ventricular  diastolic parameters are consistent with Grade II diastolic dysfunction  (pseudonormalization).   Right Ventricle: The right ventricular size is normal. No increase in  right ventricular wall thickness. Right ventricular systolic function is  normal. There is normal pulmonary artery systolic pressure. The tricuspid  regurgitant velocity is 1.16 m/s, and   with an assumed right  the last 72 hours.  Invalid input(s): "FREET3" _____________  ECHOCARDIOGRAM COMPLETE  Result Date: 12/05/2022    ECHOCARDIOGRAM REPORT   Patient Name:   Corey Hale Date of Exam: 12/05/2022 Medical Rec #:  [id number]     Height:       72.0 in Accession #:    [id number]    Weight:       184.5 lb Date of Birth:  1966-02-04     BSA:          2.059 m Patient Age:    57 years      BP:           102/71 mmHg Patient Gender: M             HR:           77 bpm. Exam Location:  Inpatient Procedure: 2D Echo, Color Doppler, Cardiac Doppler and Intracardiac            Opacification Agent Indications:    NSTEMI I21.4  History:        Patient has no prior history of Echocardiogram examinations.                 NSTEMI; Risk Factors:Current Smoker, Hypertension and                 Dyslipidemia.  Sonographer:    Aron Baba Referring Phys: [id number] NATHAN P GOODWIN  Sonographer Comments: Image acquisition challenging due to patient body habitus. IMPRESSIONS  1. Left ventricular ejection fraction, by estimation, is 60 to 65%. The left ventricle has normal function. The left ventricle has no regional wall motion abnormalities. Left ventricular diastolic parameters are consistent with Grade II diastolic dysfunction (pseudonormalization). There is mild hypokinesis of the left ventricular, basal inferoseptal wall, inferior wall and inferolateral wall.  2. Right ventricular systolic function is normal. The right ventricular size is normal. There is normal pulmonary artery systolic pressure.  3. Left atrial size was mildly dilated.   4. The mitral valve is normal in structure. No evidence of mitral valve regurgitation. No evidence of mitral stenosis.  5. The aortic valve is normal in structure. Aortic valve regurgitation is not visualized. No aortic stenosis is present.  6. The inferior vena cava is normal in size with greater than 50% respiratory variability, suggesting right atrial pressure of 3 mmHg. FINDINGS  Left Ventricle: Left ventricular ejection fraction, by estimation, is 60 to 65%. The left ventricle has normal function. The left ventricle has no regional wall motion abnormalities. Mild hypokinesis of the left ventricular, basal inferoseptal wall, inferior wall and inferolateral wall. Definity contrast agent was given IV to delineate the left ventricular endocardial borders. The left ventricular internal cavity size was normal in size. There is no left ventricular hypertrophy. Left ventricular diastolic parameters are consistent with Grade II diastolic dysfunction (pseudonormalization). Right Ventricle: The right ventricular size is normal. No increase in right ventricular wall thickness. Right ventricular systolic function is normal. There is normal pulmonary artery systolic pressure. The tricuspid regurgitant velocity is 1.16 m/s, and  with an assumed right atrial pressure of 8 mmHg, the estimated right ventricular systolic pressure is 13.4 mmHg. Left Atrium: Left atrial size was mildly dilated. Right Atrium: Right atrial size was normal in size. Pericardium: There is no evidence of pericardial effusion. Mitral Valve: The mitral valve is normal in structure. No evidence of mitral valve regurgitation. No evidence of mitral valve stenosis. Tricuspid Valve: The tricuspid valve is normal in structure. Tricuspid  Discharge Summary    Patient ID: Corey Hale MRN: [id number]; DOB: [birth year]  Admit date: 12/04/2022 Discharge date: 12/06/2022  PCP:  Claiborne Rigg, NP    HeartCare Providers Cardiologist:  Verne Carrow, MD     Discharge Diagnoses    Principal Problem:   NSTEMI (non-ST elevated myocardial infarction) [facility]) Active Problems:   Tobacco abuse   Essential hypertension   Hyperlipidemia  Diagnostic Studies/Procedures    Cath: 12/05/2022    Prox RCA lesion is 30% stenosed.   Dist RCA lesion is 100% stenosed.   Mid LAD lesion is 30% stenosed.   Dist LAD lesion is 20% stenosed.   There is mild left ventricular systolic dysfunction.   LV end diastolic pressure is normal.   The left ventricular ejection fraction is 55-65% by visual estimate.   1.  The coronary arteries are ectatic with severe one-vessel coronary artery disease.  Occluded mid to distal right coronary artery with large thrombus burden and faint left-to-right collaterals. 2.  Mildly reduced LV systolic function with an EF of 40 to 45% with inferior wall hypokinesis.  Normal left ventricular end-diastolic pressure.   Recommendations: This is a late presenting inferior STEMI and onset of symptoms is 4 days ago.  Due to large thrombus, ectatic and tortuous vessel, risks of PCI outweighed the benefit especially in the setting of left-to-right collaterals.  Recommend medical therapy. 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 I added clopidogrel to be used for 1 year. I also added colchicine to be used for few weeks. Wean off nitroglycerin drip.   Diagnostic Dominance: Co-dominant   Echo: 12/05/2022  IMPRESSIONS     1. Left ventricular ejection fraction, by estimation, is 60 to 65%. The  left ventricle has normal function. The left ventricle has no regional  wall motion abnormalities. Left ventricular diastolic parameters are  consistent  with Grade II diastolic  dysfunction (pseudonormalization). There is mild hypokinesis of the left  ventricular, basal inferoseptal wall, inferior wall and inferolateral  wall.   2. Right ventricular systolic function is normal. The right ventricular  size is normal. There is normal pulmonary artery systolic pressure.   3. Left atrial size was mildly dilated.   4. The mitral valve is normal in structure. No evidence of mitral valve  regurgitation. No evidence of mitral stenosis.   5. The aortic valve is normal in structure. Aortic valve regurgitation is  not visualized. No aortic stenosis is present.   6. The inferior vena cava is normal in size with greater than 50%  respiratory variability, suggesting right atrial pressure of 3 mmHg.   FINDINGS   Left Ventricle: Left ventricular ejection fraction, by estimation, is 60  to 65%. The left ventricle has normal function. The left ventricle has no  regional wall motion abnormalities. Mild hypokinesis of the left  ventricular, basal inferoseptal wall,  inferior wall and inferolateral wall. Definity contrast agent was given IV  to delineate the left ventricular endocardial borders. The left  ventricular internal cavity size was normal in size. There is no left  ventricular hypertrophy. Left ventricular  diastolic parameters are consistent with Grade II diastolic dysfunction  (pseudonormalization).   Right Ventricle: The right ventricular size is normal. No increase in  right ventricular wall thickness. Right ventricular systolic function is  normal. There is normal pulmonary artery systolic pressure. The tricuspid  regurgitant velocity is 1.16 m/s, and   with an assumed right  Discharge Summary    Patient ID: Corey Hale MRN: [id number]; DOB: [birth year]  Admit date: 12/04/2022 Discharge date: 12/06/2022  PCP:  Claiborne Rigg, NP    HeartCare Providers Cardiologist:  Verne Carrow, MD     Discharge Diagnoses    Principal Problem:   NSTEMI (non-ST elevated myocardial infarction) [facility]) Active Problems:   Tobacco abuse   Essential hypertension   Hyperlipidemia  Diagnostic Studies/Procedures    Cath: 12/05/2022    Prox RCA lesion is 30% stenosed.   Dist RCA lesion is 100% stenosed.   Mid LAD lesion is 30% stenosed.   Dist LAD lesion is 20% stenosed.   There is mild left ventricular systolic dysfunction.   LV end diastolic pressure is normal.   The left ventricular ejection fraction is 55-65% by visual estimate.   1.  The coronary arteries are ectatic with severe one-vessel coronary artery disease.  Occluded mid to distal right coronary artery with large thrombus burden and faint left-to-right collaterals. 2.  Mildly reduced LV systolic function with an EF of 40 to 45% with inferior wall hypokinesis.  Normal left ventricular end-diastolic pressure.   Recommendations: This is a late presenting inferior STEMI and onset of symptoms is 4 days ago.  Due to large thrombus, ectatic and tortuous vessel, risks of PCI outweighed the benefit especially in the setting of left-to-right collaterals.  Recommend medical therapy. 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 I added clopidogrel to be used for 1 year. I also added colchicine to be used for few weeks. Wean off nitroglycerin drip.   Diagnostic Dominance: Co-dominant   Echo: 12/05/2022  IMPRESSIONS     1. Left ventricular ejection fraction, by estimation, is 60 to 65%. The  left ventricle has normal function. The left ventricle has no regional  wall motion abnormalities. Left ventricular diastolic parameters are  consistent  with Grade II diastolic  dysfunction (pseudonormalization). There is mild hypokinesis of the left  ventricular, basal inferoseptal wall, inferior wall and inferolateral  wall.   2. Right ventricular systolic function is normal. The right ventricular  size is normal. There is normal pulmonary artery systolic pressure.   3. Left atrial size was mildly dilated.   4. The mitral valve is normal in structure. No evidence of mitral valve  regurgitation. No evidence of mitral stenosis.   5. The aortic valve is normal in structure. Aortic valve regurgitation is  not visualized. No aortic stenosis is present.   6. The inferior vena cava is normal in size with greater than 50%  respiratory variability, suggesting right atrial pressure of 3 mmHg.   FINDINGS   Left Ventricle: Left ventricular ejection fraction, by estimation, is 60  to 65%. The left ventricle has normal function. The left ventricle has no  regional wall motion abnormalities. Mild hypokinesis of the left  ventricular, basal inferoseptal wall,  inferior wall and inferolateral wall. Definity contrast agent was given IV  to delineate the left ventricular endocardial borders. The left  ventricular internal cavity size was normal in size. There is no left  ventricular hypertrophy. Left ventricular  diastolic parameters are consistent with Grade II diastolic dysfunction  (pseudonormalization).   Right Ventricle: The right ventricular size is normal. No increase in  right ventricular wall thickness. Right ventricular systolic function is  normal. There is normal pulmonary artery systolic pressure. The tricuspid  regurgitant velocity is 1.16 m/s, and   with an assumed right  Discharge Summary    Patient ID: Corey Hale MRN: [id number]; DOB: [birth year]  Admit date: 12/04/2022 Discharge date: 12/06/2022  PCP:  Claiborne Rigg, NP    HeartCare Providers Cardiologist:  Verne Carrow, MD     Discharge Diagnoses    Principal Problem:   NSTEMI (non-ST elevated myocardial infarction) [facility]) Active Problems:   Tobacco abuse   Essential hypertension   Hyperlipidemia  Diagnostic Studies/Procedures    Cath: 12/05/2022    Prox RCA lesion is 30% stenosed.   Dist RCA lesion is 100% stenosed.   Mid LAD lesion is 30% stenosed.   Dist LAD lesion is 20% stenosed.   There is mild left ventricular systolic dysfunction.   LV end diastolic pressure is normal.   The left ventricular ejection fraction is 55-65% by visual estimate.   1.  The coronary arteries are ectatic with severe one-vessel coronary artery disease.  Occluded mid to distal right coronary artery with large thrombus burden and faint left-to-right collaterals. 2.  Mildly reduced LV systolic function with an EF of 40 to 45% with inferior wall hypokinesis.  Normal left ventricular end-diastolic pressure.   Recommendations: This is a late presenting inferior STEMI and onset of symptoms is 4 days ago.  Due to large thrombus, ectatic and tortuous vessel, risks of PCI outweighed the benefit especially in the setting of left-to-right collaterals.  Recommend medical therapy. 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 I added clopidogrel to be used for 1 year. I also added colchicine to be used for few weeks. Wean off nitroglycerin drip.   Diagnostic Dominance: Co-dominant   Echo: 12/05/2022  IMPRESSIONS     1. Left ventricular ejection fraction, by estimation, is 60 to 65%. The  left ventricle has normal function. The left ventricle has no regional  wall motion abnormalities. Left ventricular diastolic parameters are  consistent  with Grade II diastolic  dysfunction (pseudonormalization). There is mild hypokinesis of the left  ventricular, basal inferoseptal wall, inferior wall and inferolateral  wall.   2. Right ventricular systolic function is normal. The right ventricular  size is normal. There is normal pulmonary artery systolic pressure.   3. Left atrial size was mildly dilated.   4. The mitral valve is normal in structure. No evidence of mitral valve  regurgitation. No evidence of mitral stenosis.   5. The aortic valve is normal in structure. Aortic valve regurgitation is  not visualized. No aortic stenosis is present.   6. The inferior vena cava is normal in size with greater than 50%  respiratory variability, suggesting right atrial pressure of 3 mmHg.   FINDINGS   Left Ventricle: Left ventricular ejection fraction, by estimation, is 60  to 65%. The left ventricle has normal function. The left ventricle has no  regional wall motion abnormalities. Mild hypokinesis of the left  ventricular, basal inferoseptal wall,  inferior wall and inferolateral wall. Definity contrast agent was given IV  to delineate the left ventricular endocardial borders. The left  ventricular internal cavity size was normal in size. There is no left  ventricular hypertrophy. Left ventricular  diastolic parameters are consistent with Grade II diastolic dysfunction  (pseudonormalization).   Right Ventricle: The right ventricular size is normal. No increase in  right ventricular wall thickness. Right ventricular systolic function is  normal. There is normal pulmonary artery systolic pressure. The tricuspid  regurgitant velocity is 1.16 m/s, and   with an assumed right  Discharge Summary    Patient ID: Corey Hale MRN: [id number]; DOB: [birth year]  Admit date: 12/04/2022 Discharge date: 12/06/2022  PCP:  Claiborne Rigg, NP    HeartCare Providers Cardiologist:  Verne Carrow, MD     Discharge Diagnoses    Principal Problem:   NSTEMI (non-ST elevated myocardial infarction) [facility]) Active Problems:   Tobacco abuse   Essential hypertension   Hyperlipidemia  Diagnostic Studies/Procedures    Cath: 12/05/2022    Prox RCA lesion is 30% stenosed.   Dist RCA lesion is 100% stenosed.   Mid LAD lesion is 30% stenosed.   Dist LAD lesion is 20% stenosed.   There is mild left ventricular systolic dysfunction.   LV end diastolic pressure is normal.   The left ventricular ejection fraction is 55-65% by visual estimate.   1.  The coronary arteries are ectatic with severe one-vessel coronary artery disease.  Occluded mid to distal right coronary artery with large thrombus burden and faint left-to-right collaterals. 2.  Mildly reduced LV systolic function with an EF of 40 to 45% with inferior wall hypokinesis.  Normal left ventricular end-diastolic pressure.   Recommendations: This is a late presenting inferior STEMI and onset of symptoms is 4 days ago.  Due to large thrombus, ectatic and tortuous vessel, risks of PCI outweighed the benefit especially in the setting of left-to-right collaterals.  Recommend medical therapy. 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 I added clopidogrel to be used for 1 year. I also added colchicine to be used for few weeks. Wean off nitroglycerin drip.   Diagnostic Dominance: Co-dominant   Echo: 12/05/2022  IMPRESSIONS     1. Left ventricular ejection fraction, by estimation, is 60 to 65%. The  left ventricle has normal function. The left ventricle has no regional  wall motion abnormalities. Left ventricular diastolic parameters are  consistent  with Grade II diastolic  dysfunction (pseudonormalization). There is mild hypokinesis of the left  ventricular, basal inferoseptal wall, inferior wall and inferolateral  wall.   2. Right ventricular systolic function is normal. The right ventricular  size is normal. There is normal pulmonary artery systolic pressure.   3. Left atrial size was mildly dilated.   4. The mitral valve is normal in structure. No evidence of mitral valve  regurgitation. No evidence of mitral stenosis.   5. The aortic valve is normal in structure. Aortic valve regurgitation is  not visualized. No aortic stenosis is present.   6. The inferior vena cava is normal in size with greater than 50%  respiratory variability, suggesting right atrial pressure of 3 mmHg.   FINDINGS   Left Ventricle: Left ventricular ejection fraction, by estimation, is 60  to 65%. The left ventricle has normal function. The left ventricle has no  regional wall motion abnormalities. Mild hypokinesis of the left  ventricular, basal inferoseptal wall,  inferior wall and inferolateral wall. Definity contrast agent was given IV  to delineate the left ventricular endocardial borders. The left  ventricular internal cavity size was normal in size. There is no left  ventricular hypertrophy. Left ventricular  diastolic parameters are consistent with Grade II diastolic dysfunction  (pseudonormalization).   Right Ventricle: The right ventricular size is normal. No increase in  right ventricular wall thickness. Right ventricular systolic function is  normal. There is normal pulmonary artery systolic pressure. The tricuspid  regurgitant velocity is 1.16 m/s, and   with an assumed right

## 2022-12-06 NOTE — Plan of Care (Signed)

## 2022-12-06 NOTE — Progress Notes (Addendum)
Patient provided with verbal discharge instructions. Paper copy of discharge provided to patient in [place] and spanish. RN answered all questions. VSS at discharge. IV's removed. Patient belongings sent with patient. Patient dc'd via wheelchair to private vehicle

## 2022-12-06 NOTE — Telephone Encounter (Signed)
Transition of Care Follow-up Phone Call Request    Patient Name: RALIK HERRIG Date of Birth: 1965/10/07 Date of Encounter: 12/06/2022  Primary Care Provider:  Claiborne Rigg, NP Primary Cardiologist:  Verne Carrow, MD  Clydie Braun has been scheduled for a transition of care follow up appointment with a [facility] provider:  Jari Favre 9/30  Please reach out to Clydie Braun within 48 hours to confirm appointment and review transition of care protocol questionnaire.  Laverda Page, NP  12/06/2022, 10:07 AM

## 2022-12-06 NOTE — Progress Notes (Signed)
Rounding Note    Patient Name: Corey Hale Date of Encounter: 12/06/2022  [facility] Cardiologist: None   Subjective   No chest pain or dyspnea this am.   Inpatient Medications    Scheduled Meds:  aspirin EC  81 mg Oral Daily   atorvastatin  40 mg Oral Daily   clopidogrel  75 mg Oral Q breakfast   colchicine  0.6 mg Oral BID   enoxaparin (LOVENOX) injection  40 mg Subcutaneous Q24H   metoprolol tartrate  12.5 mg Oral BID   sodium chloride flush  3 mL Intravenous Q12H   valACYclovir  1,000 mg Oral Daily   Continuous Infusions:  sodium chloride     nitroGLYCERIN 40 mcg/min (12/06/22 0729)   PRN Meds: sodium chloride, acetaminophen, fentaNYL (SUBLIMAZE) injection, ondansetron (ZOFRAN) IV, mouth rinse, oxyCODONE, sodium chloride flush   Vital Signs    Vitals:   12/05/22 2316 12/06/22 0317 12/06/22 0652 12/06/22 0711  BP: 103/75  98/66 118/76  Pulse: 82 97    Resp: 19 20 18 18   Temp: 98.7 F (37.1 C) 99.4 F (37.4 C)  99.4 F (37.4 C)  TempSrc: Oral Oral  Oral  SpO2:   94% 92%  Weight:      Height:        Intake/Output Summary (Last 24 hours) at 12/06/2022 0820 Last data filed at 12/05/2022 2318 Gross per 24 hour  Intake 965.24 ml  Output 1100 ml  Net -134.76 ml      12/05/2022    2:28 AM 12/04/2022    8:41 PM 08/16/2022   11:08 AM  Last 3 Weights  Weight (lbs) 184 lb 8.4 oz 190 lb 196 lb 12.8 oz  Weight (kg) 83.7 kg 86.183 kg 89.268 kg      Telemetry     Sinus - Personally Reviewed  ECG    Sinus, first degree AV block - Personally Reviewed  Physical Exam   GEN: No acute distress.   Neck: No JVD Cardiac: RRR, no murmurs, rubs, or gallops.  Respiratory: Clear to auscultation bilaterally. GI: Soft, nontender, non-distended  MS: No edema; No deformity. Neuro:  Nonfocal  Psych: Normal affect   Labs    High Sensitivity Troponin:   Recent Labs  Lab 12/04/22 2048 12/04/22 2248 12/05/22 0600  TROPONINIHS 8,912* 8,232* 7,848*      Chemistry Recent Labs  Lab 12/04/22 2048 12/05/22 0600 12/05/22 0800  NA 127*  --  132*  K 2.4*  --  3.0*  CL 88*  --  93*  CO2 28  --  25  GLUCOSE 130*  --  113*  BUN 12  --  8  CREATININE 1.04  --  0.86  CALCIUM 8.8*  --  8.7*  MG 2.1 1.9  --   PROT  --  6.7  --   ALBUMIN  --  3.3*  --   AST  --  55*  --   ALT  --  23  --   ALKPHOS  --  47  --   BILITOT  --  0.8  --   GFRNONAA >60  --  >60  ANIONGAP 11  --  14    Lipids  Recent Labs  Lab 12/05/22 0600  CHOL 119  TRIG 69  HDL 47  LDLCALC 58  CHOLHDL 2.5    Hematology Recent Labs  Lab 12/04/22 2048 12/05/22 0600  WBC 15.2* 13.9*  RBC 4.64 4.27  HGB 13.4 12.2*  HCT 39.8  36.5*  MCV 85.8 85.5  MCH 28.9 28.6  MCHC 33.7 33.4  RDW 13.3 13.5  PLT 208 179   Thyroid No results for input(s): "TSH", "FREET4" in the last 168 hours.  BNP Recent Labs  Lab 12/05/22 0600  BNP 157.7*    DDimer  Recent Labs  Lab 12/04/22 2048  DDIMER 0.63*     Radiology    ECHOCARDIOGRAM COMPLETE  Result Date: 12/05/2022    ECHOCARDIOGRAM REPORT   Patient Name:   [name] Date of Exam: 12/05/2022 Medical Rec #:  [id number]     Height:       72.0 in Accession #:    [id number]    Weight:       184.5 lb Date of Birth:  [birth year]     BSA:          2.059 m Patient Age:    57 years      BP:           102/71 mmHg Patient Gender: M             HR:           77 bpm. Exam Location:  Inpatient Procedure: 2D Echo, Color Doppler, Cardiac Doppler and Intracardiac            Opacification Agent Indications:    NSTEMI I21.4  History:        Patient has no prior history of Echocardiogram examinations.                 NSTEMI; Risk Factors:Current Smoker, Hypertension and                 Dyslipidemia.  Sonographer:    Aron Baba Referring Phys: [id number] NATHAN P GOODWIN  Sonographer Comments: Image acquisition challenging due to patient body habitus. IMPRESSIONS  1. Left ventricular ejection fraction, by estimation, is 60 to 65%. The left  ventricle has normal function. The left ventricle has no regional wall motion abnormalities. Left ventricular diastolic parameters are consistent with Grade II diastolic dysfunction (pseudonormalization). There is mild hypokinesis of the left ventricular, basal inferoseptal wall, inferior wall and inferolateral wall.  2. Right ventricular systolic function is normal. The right ventricular size is normal. There is normal pulmonary artery systolic pressure.  3. Left atrial size was mildly dilated.  4. The mitral valve is normal in structure. No evidence of mitral valve regurgitation. No evidence of mitral stenosis.  5. The aortic valve is normal in structure. Aortic valve regurgitation is not visualized. No aortic stenosis is present.  6. The inferior vena cava is normal in size with greater than 50% respiratory variability, suggesting right atrial pressure of 3 mmHg. FINDINGS  Left Ventricle: Left ventricular ejection fraction, by estimation, is 60 to 65%. The left ventricle has normal function. The left ventricle has no regional wall motion abnormalities. Mild hypokinesis of the left ventricular, basal inferoseptal wall, inferior wall and inferolateral wall. Definity contrast agent was given IV to delineate the left ventricular endocardial borders. The left ventricular internal cavity size was normal in size. There is no left ventricular hypertrophy. Left ventricular diastolic parameters are consistent with Grade II diastolic dysfunction (pseudonormalization). Right Ventricle: The right ventricular size is normal. No increase in right ventricular wall thickness. Right ventricular systolic function is normal. There is normal pulmonary artery systolic pressure. The tricuspid regurgitant velocity is 1.16 m/s, and  with an assumed right atrial pressure of 8 mmHg, the estimated right ventricular systolic pressure is  13.4 mmHg. Left Atrium: Left atrial size was mildly dilated. Right Atrium: Right atrial size was normal in  size. Pericardium: There is no evidence of pericardial effusion. Mitral Valve: The mitral valve is normal in structure. No evidence of mitral valve regurgitation. No evidence of mitral valve stenosis. Tricuspid Valve: The tricuspid valve is normal in structure. Tricuspid valve regurgitation is not demonstrated. No evidence of tricuspid stenosis. Aortic Valve: The aortic valve is normal in structure. Aortic valve regurgitation is not visualized. No aortic stenosis is present. Pulmonic Valve: The pulmonic valve was normal in structure. Pulmonic valve regurgitation is not visualized. No evidence of pulmonic stenosis. Aorta: The aortic root is normal in size and structure. Venous: The inferior vena cava is normal in size with greater than 50% respiratory variability, suggesting right atrial pressure of 3 mmHg. IAS/Shunts: No atrial level shunt detected by color flow Doppler.  LEFT VENTRICLE PLAX 2D LVIDd:         5.00 cm   Diastology LVIDs:         3.20 cm   LV e' medial:    4.13 cm/s LV PW:         1.00 cm   LV E/e' medial:  18.9 LV IVS:        1.00 cm   LV e' lateral:   6.53 cm/s LVOT diam:     2.00 cm   LV E/e' lateral: 11.9 LV SV:         54 LV SV Index:   26 LVOT Area:     3.14 cm  RIGHT VENTRICLE RV S prime:     16.60 cm/s TAPSE (M-mode): 2.6 cm LEFT ATRIUM             Index        RIGHT ATRIUM           Index LA diam:        4.50 cm 2.19 cm/m   RA Area:     11.50 cm LA Vol (A2C):   37.7 ml 18.31 ml/m  RA Volume:   27.80 ml  13.50 ml/m LA Vol (A4C):   41.1 ml 19.96 ml/m LA Biplane Vol: 40.9 ml 19.87 ml/m  AORTIC VALVE LVOT Vmax:   102.00 cm/s LVOT Vmean:  66.100 cm/s LVOT VTI:    0.173 m  AORTA Ao Root diam: 3.20 cm Ao Asc diam:  4.10 cm MITRAL VALVE               TRICUSPID VALVE MV Area (PHT): 3.12 cm    TR Peak grad:   5.4 mmHg MV Decel Time: 243 msec    TR Vmax:        116.00 cm/s MR Peak grad: 2.2 mmHg MR Vmax:      75.00 cm/s   SHUNTS MV E velocity: 78.00 cm/s  Systemic VTI:  0.17 m MV A velocity:  81.80 cm/s  Systemic Diam: 2.00 cm MV E/A ratio:  0.95 Mihai Croitoru MD Electronically signed by Thurmon Fair MD Signature Date/Time: 12/05/2022/3:22:10 PM    Final    CARDIAC CATHETERIZATION  Result Date: 12/05/2022   Prox RCA lesion is 30% stenosed.   Dist RCA lesion is 100% stenosed.   Mid LAD lesion is 30% stenosed.   Dist LAD lesion is 20% stenosed.   There is mild left ventricular systolic dysfunction.   LV end diastolic pressure is normal.   The left ventricular ejection fraction is 55-65% by visual estimate. 1.  The coronary arteries  are ectatic with severe one-vessel coronary artery disease.  Occluded mid to distal right coronary artery with large thrombus burden and faint left-to-right collaterals. 2.  Mildly reduced LV systolic function with an EF of 40 to 45% with inferior wall hypokinesis.  Normal left ventricular end-diastolic pressure. Recommendations: This is a late presenting inferior STEMI and onset of symptoms is 4 days ago.  Due to large thrombus, ectatic and tortuous vessel, risks of PCI outweighed the benefit especially in the setting of left-to-right collaterals.  Recommend medical therapy. 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 I added clopidogrel to be used for 1 year. I also added colchicine to be used for few weeks. Wean off nitroglycerin drip.   DG Chest 2 View  Result Date: 12/04/2022 CLINICAL DATA:  Chest pain for 2 days then. Chest pain came back tonight around dinnertime. EXAM: CHEST - 2 VIEW COMPARISON:  Radiographs 08/21/2019 FINDINGS: Low lung volumes accentuate cardiomediastinal silhouette and pulmonary vascularity. Bibasilar atelectasis. Aortic atherosclerotic calcification. Remote right clavicle fracture. Chronic compression of mid and lower thoracic vertebral bodies. IMPRESSION: Low lung volumes with basilar atelectasis. Electronically Signed   By: Minerva Fester M.D.   On: 12/04/2022 21:24    Cardiac Studies      Patient Profile     57 y.o. male with history of HTN, HLD, tobacco abuse admitted with chest pain and elevated troponin c/w NSTEMI. Cardiac cath with occluded mid to distal RCA, no PCI performed given late presentation of MI.   Assessment & Plan    NSTEMI: Cardiac cath with occluded mid to distal RCA. The RCA was not opened. Dr. Kirke Corin felt that due to the late presentation of his MI, PCI would not be beneficial. Echo with preserved LV systolic function. Will plan to continue DAPT with ASA and Plavix for one year. Continue statin and beta blocker.  HTN: BP is well controlled.  HLD: continue statin Hypokalemia: Repeat BMET this am.   Possible d/c home later today after lunch if ambulating well and stable.   For questions or updates, please contact [facility] Please consult [email or web address] for contact info under        Signed, Verne Carrow, MD  12/06/2022, 8:20 AM

## 2022-12-07 ENCOUNTER — Telehealth (HOSPITAL_COMMUNITY): Payer: Self-pay

## 2022-12-07 ENCOUNTER — Other Ambulatory Visit (HOSPITAL_COMMUNITY): Payer: Self-pay

## 2022-12-07 NOTE — Telephone Encounter (Signed)
Patient contacted regarding discharge from [facility] on 12/06/2022.  Patient understands to follow up with provider Suann Larry NP on 12/17/22 at 8:50 am at [address] . Patient understands discharge instructions? yes Patient understands medications and regiment? yes Patient understands to bring all medications to this visit? yes  Ask patient:  Are you enrolled in My Chart yes    Pt had no further questions and says that he is doing well.

## 2022-12-07 NOTE — Telephone Encounter (Signed)
Called and spoke with pt in regards to CR, pt stated he is not interested at this time.   Closed referral

## 2022-12-10 ENCOUNTER — Other Ambulatory Visit: Payer: Self-pay

## 2022-12-10 ENCOUNTER — Other Ambulatory Visit (HOSPITAL_COMMUNITY): Payer: Self-pay

## 2022-12-12 ENCOUNTER — Other Ambulatory Visit (HOSPITAL_COMMUNITY): Payer: Self-pay

## 2022-12-12 DIAGNOSIS — M129 Arthropathy, unspecified: Secondary | ICD-10-CM | POA: Diagnosis not present

## 2022-12-12 MED ORDER — OXYCODONE HCL 20 MG PO TABS
1.0000 | ORAL_TABLET | Freq: Four times a day (QID) | ORAL | 0 refills | Status: DC | PRN
  Filled 2022-12-12: qty 28, 7d supply, fill #0

## 2022-12-12 MED ORDER — NALOXONE HCL 4 MG/0.1ML NA LIQD
4.0000 mg | NASAL | 1 refills | Status: DC | PRN
  Filled 2022-12-12: qty 2, 30d supply, fill #0

## 2022-12-12 MED ORDER — XTAMPZA ER 9 MG PO C12A
1.0000 | EXTENDED_RELEASE_CAPSULE | Freq: Two times a day (BID) | ORAL | 0 refills | Status: DC
  Filled 2022-12-12: qty 14, 7d supply, fill #0

## 2022-12-13 ENCOUNTER — Ambulatory Visit: Payer: 59 | Admitting: Physician Assistant

## 2022-12-13 ENCOUNTER — Other Ambulatory Visit (HOSPITAL_COMMUNITY): Payer: Self-pay

## 2022-12-16 NOTE — Progress Notes (Unsigned)
Cardiology Office Note:  .   Date:  12/17/2022  ID:  Corey Hale, DOB 26-Mar-1965, MRN [id number] PCP: Claiborne Rigg, NP  [facility] Providers Cardiologist:  Verne Carrow, MD {  History of Present Illness: .   Corey Hale is a 57 y.o. male with a past medical history of HTN, prediabetes, dyslipidemia, GERD, tobacco dependence who was seen 12/23/22 for the evaluation of chest pain.  Initially presented to [facility] emergency room for chest discomfort.  Stated that over the last 3 days prior to admission he had waxing and waning chest discomfort.  That was related to gas as his discomfort seem to worsen with food intake.  Consequently, he had essentially minimal food or drink over the last 3 or so days.  His recurring chest pain had increased in intensity over the last 2 days which ultimately prompted him to present to the ER.  Worsening pain when he laid down and improved with sitting up.  Worsened with deep inspiration.  Stated that he had similar pain in the past around 4 to 5 years ago.  He underwent evaluation which revealed that his discomfort was related to "gas pain".  On presentation in the ER he was afebrile, heart rate in 60s, blood pressure 105 to 125 mmHg not requiring supplemental oxygen.  CBC unremarkable outside of slightly elevated WBC 15.2.  BMP with NA 127, K2.4, creatinine 1.04, mag 2.1.  Troponin 8912>> 8232.  EKG with subtle ST elevation in inferior leads but does not meet STEMI criteria.  He was given aspirin 324 mg and heparin 4000 units, morphine 4 mg, KCl 60 mEq p.o. and 20 mEq IV, famotidine 20 mg started on IV heparin drip and nitroglycerin.  He was then transferred to [facility] and stated he had 5 out of 10 chest discomfort though appeared well.  CAD smoked 2 to 3 cigarettes for multiple years and quit around 2 weeks ago.  Stated that he had a brother with cardiac disease who passed away in his 73s.  Both parents had a stroke.   Ultimately, it was felt that risk of PCI outweighed the benefits as he already develops left-to-right collaterals.  Recommended for medical therapy and started on aspirin and Plavix for 1 year.  Today, he has not had any issues with his heart. We discussed his cardiac cath in detail. Slight language barrier and hearing barrier. We discussed why he did not need a stent at this time and about collateral blood flow. We discussed his plavix and asa x 1 year then plavix indefinitely. I congratulated him on his smoking cessation. He was started on colchicine while he was in the hospital for pericarditis and he plans to finish the pills that he has but will not need another prescription. Otherwise, doing well without any chest pains or SOB. Offered cardiac rehab but patient would prefer to do this on his own.   Reports no shortness of breath nor dyspnea on exertion. Reports no chest pain, pressure, or tightness. No edema, orthopnea, PND. Reports no palpitations.   ROS: Pertinent ROS in HPI  Studies Reviewed: .        Cath: 12-23-22     Prox RCA lesion is 30% stenosed.   Dist RCA lesion is 100% stenosed.   Mid LAD lesion is 30% stenosed.   Dist LAD lesion is 20% stenosed.   There is mild left ventricular systolic dysfunction.   LV end diastolic pressure is normal.  The left ventricular ejection fraction is 55-65% by visual estimate.   1.  The coronary arteries are ectatic with severe one-vessel coronary artery disease.  Occluded mid to distal right coronary artery with large thrombus burden and faint left-to-right collaterals. 2.  Mildly reduced LV systolic function with an EF of 40 to 45% with inferior wall hypokinesis.  Normal left ventricular end-diastolic pressure.   Recommendations: This is a late presenting inferior STEMI and onset of symptoms is 4 days ago.  Due to large thrombus, ectatic and tortuous vessel, risks of PCI outweighed the benefit especially in the setting of left-to-right  collaterals.  Recommend medical therapy. 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 I added clopidogrel to be used for 1 year. I also added colchicine to be used for few weeks. Wean off nitroglycerin drip.   Diagnostic Dominance: Co-dominant    Echo: 12/05/2022   IMPRESSIONS     1. Left ventricular ejection fraction, by estimation, is 60 to 65%. The  left ventricle has normal function. The left ventricle has no regional  wall motion abnormalities. Left ventricular diastolic parameters are  consistent with Grade II diastolic  dysfunction (pseudonormalization). There is mild hypokinesis of the left  ventricular, basal inferoseptal wall, inferior wall and inferolateral  wall.   2. Right ventricular systolic function is normal. The right ventricular  size is normal. There is normal pulmonary artery systolic pressure.   3. Left atrial size was mildly dilated.   4. The mitral valve is normal in structure. No evidence of mitral valve  regurgitation. No evidence of mitral stenosis.   5. The aortic valve is normal in structure. Aortic valve regurgitation is  not visualized. No aortic stenosis is present.   6. The inferior vena cava is normal in size with greater than 50%  respiratory variability, suggesting right atrial pressure of 3 mmHg.   FINDINGS   Left Ventricle: Left ventricular ejection fraction, by estimation, is 60  to 65%. The left ventricle has normal function. The left ventricle has no  regional wall motion abnormalities. Mild hypokinesis of the left  ventricular, basal inferoseptal wall,  inferior wall and inferolateral wall. Definity contrast agent was given IV  to delineate the left ventricular endocardial borders. The left  ventricular internal cavity size was normal in size. There is no left  ventricular hypertrophy. Left ventricular  diastolic parameters are consistent with Grade II diastolic dysfunction   (pseudonormalization).   Right Ventricle: The right ventricular size is normal. No increase in  right ventricular wall thickness. Right ventricular systolic function is  normal. There is normal pulmonary artery systolic pressure. The tricuspid  regurgitant velocity is 1.16 m/s, and   with an assumed right atrial pressure of 8 mmHg, the estimated right  ventricular systolic pressure is 13.4 mmHg.   Left Atrium: Left atrial size was mildly dilated.   Right Atrium: Right atrial size was normal in size.   Pericardium: There is no evidence of pericardial effusion.   Mitral Valve: The mitral valve is normal in structure. No evidence of  mitral valve regurgitation. No evidence of mitral valve stenosis.   Tricuspid Valve: The tricuspid valve is normal in structure. Tricuspid  valve regurgitation is not demonstrated. No evidence of tricuspid  stenosis.   Aortic Valve: The aortic valve is normal in structure. Aortic valve  regurgitation is not visualized. No aortic stenosis is present.   Pulmonic Valve: The pulmonic valve was normal in structure. Pulmonic  valve  regurgitation is not visualized. No evidence of pulmonic stenosis.   Aorta: The aortic root is normal in size and structure.   Venous: The inferior vena cava is normal in size with greater than 50%  respiratory variability, suggesting right atrial pressure of 3 mmHg.   IAS/Shunts: No atrial level shunt detected by color flow Doppler.  _____________       Physical Exam:   VS:  BP 108/70   Pulse 83   Ht 6' (1.829 m)   Wt 193 lb 6.4 oz (87.7 kg)   SpO2 98%   BMI 26.23 kg/m    Wt Readings from Last 3 Encounters:  12/17/22 193 lb 6.4 oz (87.7 kg)  12/05/22 184 lb 8.4 oz (83.7 kg)  08/16/22 196 lb 12.8 oz (89.3 kg)    GEN: Well nourished, well developed in no acute distress NECK: No JVD; No carotid bruits CARDIAC: RRR, no murmurs, rubs, gallops RESPIRATORY:  Clear to auscultation without rales, wheezing or rhonchi   ABDOMEN: Soft, non-tender, non-distended EXTREMITIES:  No edema; No deformity   ASSESSMENT AND PLAN: .   1.  NSTEMI -no PCI needed due to collateral blood flow -Would recommend continuing current medication regimen which includes aspirin 81 mg daily, Lipitor 40 mg daily, Plavix 25 mg daily, colchicine 0.6 mg twice a day, Lopressor 12.5 mg twice a day, nitro as needed -no chest pain or SOB  2. HFmrEF -Euvolemic on exam -Continue current medication regimen  3.  HTN -Blood pressure well-controlled today 108/70 -No medication changes today -Would encourage low-sodium, heart healthy diet  4.  HLD -Recent lipid panel showed LDL 58, HDL 47, triglycerides 69 -would continue current medication regimen  5.  Hypokalemia (up to 3.3 on discharge) -check potassium today -he is not on any supplementation right now      Dispo: He can follow-up with Dr. Clifton James or APP in 3 months  Signed, Sharlene Dory, PA-C

## 2022-12-17 ENCOUNTER — Encounter: Payer: Self-pay | Admitting: Physician Assistant

## 2022-12-17 ENCOUNTER — Ambulatory Visit: Payer: Medicaid Other | Attending: Physician Assistant | Admitting: Physician Assistant

## 2022-12-17 VITALS — BP 108/70 | HR 83 | Ht 72.0 in | Wt 193.4 lb

## 2022-12-17 DIAGNOSIS — I10 Essential (primary) hypertension: Secondary | ICD-10-CM

## 2022-12-17 DIAGNOSIS — E785 Hyperlipidemia, unspecified: Secondary | ICD-10-CM

## 2022-12-17 DIAGNOSIS — I5022 Chronic systolic (congestive) heart failure: Secondary | ICD-10-CM | POA: Diagnosis not present

## 2022-12-17 DIAGNOSIS — I214 Non-ST elevation (NSTEMI) myocardial infarction: Secondary | ICD-10-CM | POA: Diagnosis not present

## 2022-12-17 DIAGNOSIS — E876 Hypokalemia: Secondary | ICD-10-CM | POA: Diagnosis not present

## 2022-12-17 LAB — BASIC METABOLIC PANEL
BUN/Creatinine Ratio: 10 (ref 9–20)
BUN: 7 mg/dL (ref 6–24)
CO2: 28 mmol/L (ref 20–29)
Calcium: 9.3 mg/dL (ref 8.7–10.2)
Chloride: 101 mmol/L (ref 96–106)
Creatinine, Ser: 0.7 mg/dL — ABNORMAL LOW (ref 0.76–1.27)
Glucose: 109 mg/dL — ABNORMAL HIGH (ref 70–99)
Potassium: 4.9 mmol/L (ref 3.5–5.2)
Sodium: 139 mmol/L (ref 134–144)
eGFR: 107 mL/min/{1.73_m2} (ref >59)

## 2022-12-17 NOTE — Patient Instructions (Signed)
Medication Instructions:  You can stop colchicine when you finish the supply that you have on hand. *If you need a refill on your cardiac medications before your next appointment, please call your pharmacy*  Lab Work: BMET-TODAY If you have labs (blood work) drawn today and your tests are completely normal, you will receive your results only by: MyChart Message (if you have MyChart) OR A paper copy in the mail If you have any lab test that is abnormal or we need to change your treatment, we will call you to review the results.  Follow-Up: At [facility], you and your health needs are our priority.  As part of our continuing mission to provide you with exceptional heart care, we have created designated Provider Care Teams.  These Care Teams include your primary Cardiologist (physician) and Advanced Practice Providers (APPs -  Physician Assistants and Nurse Practitioners) who all work together to provide you with the care you need, when you need it.  Your next appointment:   3 month(s)  Provider:   Verne Carrow, MD  or APP   Low-Sodium Eating Plan Salt (sodium) helps you keep a healthy balance of fluids in your body. Too much sodium can raise your blood pressure. It can also cause fluid and waste to be held in your body. Your health care provider or dietitian may recommend a low-sodium eating plan if you have high blood pressure (hypertension), kidney disease, liver disease, or heart failure. Eating less sodium can help lower your blood pressure and reduce swelling. It can also protect your heart, liver, and kidneys. What are tips for following this plan? Reading food labels  Check food labels for the amount of sodium per serving. If you eat more than one serving, you must multiply the listed amount by the number of servings. Choose foods with less than 140 milligrams (mg) of sodium per serving. Avoid foods with 300 mg of sodium or more per serving. Always check how much  sodium is in a product, even if the label says "unsalted" or "no salt added." Shopping  Buy products labeled as "low-sodium" or "no salt added." Buy fresh foods. Avoid canned foods and pre-made or frozen meals. Avoid canned, cured, or processed meats. Buy breads that have less than 80 mg of sodium per slice. Cooking  Eat more home-cooked food. Try to eat less restaurant, buffet, and fast food. Try not to add salt when you cook. Use salt-free seasonings or herbs instead of table salt or sea salt. Check with your provider or pharmacist before using salt substitutes. Cook with plant-based oils, such as canola, sunflower, or olive oil. Meal planning When eating at a restaurant, ask if your food can be made with less salt or no salt. Avoid dishes labeled as brined, pickled, cured, or smoked. Avoid dishes made with soy sauce, miso, or teriyaki sauce. Avoid foods that have monosodium glutamate (MSG) in them. MSG may be added to some restaurant food, sauces, soups, bouillon, and canned foods. Make meals that can be grilled, baked, poached, roasted, or steamed. These are often made with less sodium. General information Try to limit your sodium intake to 1,500-2,300 mg each day, or the amount told by your provider. What foods should I eat? Fruits Fresh, frozen, or canned fruit. Fruit juice. Vegetables Fresh or frozen vegetables. "No salt added" canned vegetables. "No salt added" tomato sauce and paste. Low-sodium or reduced-sodium tomato and vegetable juice. Grains Low-sodium cereals, such as oats, puffed wheat and rice, and shredded wheat.  Low-sodium crackers. Unsalted rice. Unsalted pasta. Low-sodium bread. Whole grain breads and whole grain pasta. Meats and other proteins Fresh or frozen meat, poultry, seafood, and fish. These should have no added salt. Low-sodium canned tuna and salmon. Unsalted nuts. Dried peas, beans, and lentils without added salt. Unsalted canned beans. Eggs. Unsalted nut  butters. Dairy Milk. Soy milk. Cheese that is naturally low in sodium, such as ricotta cheese, fresh mozzarella, or Swiss cheese. Low-sodium or reduced-sodium cheese. Cream cheese. Yogurt. Seasonings and condiments Fresh and dried herbs and spices. Salt-free seasonings. Low-sodium mustard and ketchup. Sodium-free salad dressing. Sodium-free light mayonnaise. Fresh or refrigerated horseradish. Lemon juice. Vinegar. Other foods Homemade, reduced-sodium, or low-sodium soups. Unsalted popcorn and pretzels. Low-salt or salt-free chips. The items listed above may not be all the foods and drinks you can have. Talk to a dietitian to learn more. What foods should I avoid? Vegetables Sauerkraut, pickled vegetables, and relishes. Olives. [place] fries. Onion rings. Regular canned vegetables, except low-sodium or reduced-sodium items. Regular canned tomato sauce and paste. Regular tomato and vegetable juice. Frozen vegetables in sauces. Grains Instant hot cereals. Bread stuffing, pancake, and biscuit mixes. Croutons. Seasoned rice or pasta mixes. Noodle soup cups. Boxed or frozen macaroni and cheese. Regular salted crackers. Self-rising flour. Meats and other proteins Meat or fish that is salted, canned, smoked, spiced, or pickled. Precooked or cured meat, such as sausages or meat loaves. Tomasa Blase. Ham. Pepperoni. Hot dogs. Corned beef. Chipped beef. Salt pork. Jerky. Pickled herring, anchovies, and sardines. Regular canned tuna. Salted nuts. Dairy Processed cheese and cheese spreads. Hard cheeses. Cheese curds. Blue cheese. Feta cheese. String cheese. Regular cottage cheese. Buttermilk. Canned milk. Fats and oils Salted butter. Regular margarine. Ghee. Bacon fat. Seasonings and condiments Onion salt, garlic salt, seasoned salt, table salt, and sea salt. Canned and packaged gravies. Worcestershire sauce. Tartar sauce. Barbecue sauce. Teriyaki sauce. Soy sauce, including reduced-sodium soy sauce. Steak sauce.  Fish sauce. Oyster sauce. Cocktail sauce. Horseradish that you find on the shelf. Regular ketchup and mustard. Meat flavorings and tenderizers. Bouillon cubes. Hot sauce. Pre-made or packaged marinades. Pre-made or packaged taco seasonings. Relishes. Regular salad dressings. Salsa. Other foods Salted popcorn and pretzels. Corn chips and puffs. Potato and tortilla chips. Canned or dried soups. Pizza. Frozen entrees and pot pies. The items listed above may not be all the foods and drinks you should avoid. Talk to a dietitian to learn more. This information is not intended to replace advice given to you by your health care provider. Make sure you discuss any questions you have with your health care provider. Document Revised: 03/22/2022 Document Reviewed: 03/22/2022 Elsevier Patient Education  2024 [employer] Heart-Healthy Eating Plan Many factors influence your heart health, including eating and exercise habits. Heart health is also called coronary health. Coronary risk increases with abnormal blood fat (lipid) levels. A heart-healthy eating plan includes limiting unhealthy fats, increasing healthy fats, limiting salt (sodium) intake, and making other diet and lifestyle changes. What is my plan? Your health care provider may recommend that: You limit your fat intake to _________% or less of your total calories each day. You limit your saturated fat intake to _________% or less of your total calories each day. You limit the amount of cholesterol in your diet to less than _________ mg per day. You limit the amount of sodium in your diet to less than _________ mg per day. What are tips for following this plan? Cooking Cook foods using methods other than frying. Baking,  boiling, grilling, and broiling are all good options. Other ways to reduce fat include: Removing the skin from poultry. Removing all visible fats from meats. Steaming vegetables in water or broth. Meal planning  At meals, imagine  dividing your plate into fourths: Fill one-half of your plate with vegetables and green salads. Fill one-fourth of your plate with whole grains. Fill one-fourth of your plate with lean protein foods. Eat 2-4 cups of vegetables per day. One cup of vegetables equals 1 cup (91 g) broccoli or cauliflower florets, 2 medium carrots, 1 large bell pepper, 1 large sweet potato, 1 large tomato, 1 medium white potato, 2 cups (150 g) raw leafy greens. Eat 1-2 cups of fruit per day. One cup of fruit equals 1 small apple, 1 large banana, 1 cup (237 g) mixed fruit, 1 large orange,  cup (82 g) dried fruit, 1 cup (240 mL) 100% fruit juice. Eat more foods that contain soluble fiber. Examples include apples, broccoli, carrots, beans, peas, and barley. Aim to get 25-30 g of fiber per day. Increase your consumption of legumes, nuts, and seeds to 4-5 servings per week. One serving of dried beans or legumes equals  cup (90 g) cooked, 1 serving of nuts is  oz (12 almonds, 24 pistachios, or 7 walnut halves), and 1 serving of seeds equals  oz (8 g). Fats Choose healthy fats more often. Choose monounsaturated and polyunsaturated fats, such as olive and canola oils, avocado oil, flaxseeds, walnuts, almonds, and seeds. Eat more omega-3 fats. Choose salmon, mackerel, sardines, tuna, flaxseed oil, and ground flaxseeds. Aim to eat fish at least 2 times each week. Check food labels carefully to identify foods with trans fats or high amounts of saturated fat. Limit saturated fats. These are found in animal products, such as meats, butter, and cream. Plant sources of saturated fats include palm oil, palm kernel oil, and coconut oil. Avoid foods with partially hydrogenated oils in them. These contain trans fats. Examples are stick margarine, some tub margarines, cookies, crackers, and other baked goods. Avoid fried foods. General information Eat more home-cooked food and less restaurant, buffet, and fast food. Limit or avoid  alcohol. Limit foods that are high in added sugar and simple starches such as foods made using white refined flour (white breads, pastries, sweets). Lose weight if you are overweight. Losing just 5-10% of your body weight can help your overall health and prevent diseases such as diabetes and heart disease. Monitor your sodium intake, especially if you have high blood pressure. Talk with your health care provider about your sodium intake. Try to incorporate more vegetarian meals weekly. What foods should I eat? Fruits All fresh, canned (in natural juice), or frozen fruits. Vegetables Fresh or frozen vegetables (raw, steamed, roasted, or grilled). Green salads. Grains Most grains. Choose whole wheat and whole grains most of the time. Rice and pasta, including brown rice and pastas made with whole wheat. Meats and other proteins Lean, well-trimmed beef, veal, pork, and lamb. Chicken and [place] without skin. All fish and shellfish. Wild duck, rabbit, pheasant, and venison. Egg whites or low-cholesterol egg substitutes. Dried beans, peas, lentils, and tofu. Seeds and most nuts. Dairy Low-fat or nonfat cheeses, including ricotta and mozzarella. Skim or 1% milk (liquid, powdered, or evaporated). Buttermilk made with low-fat milk. Nonfat or low-fat yogurt. Fats and oils Non-hydrogenated (trans-free) margarines. Vegetable oils, including soybean, sesame, sunflower, olive, avocado, peanut, safflower, corn, canola, and cottonseed. Salad dressings or mayonnaise made with a vegetable oil. Beverages Water (mineral  or sparkling). Coffee and tea. Unsweetened ice tea. Diet beverages. Sweets and desserts Sherbet, gelatin, and fruit ice. Small amounts of dark chocolate. Limit all sweets and desserts. Seasonings and condiments All seasonings and condiments. The items listed above may not be a complete list of foods and beverages you can eat. Contact a dietitian for more options. What foods should I  avoid? Fruits Canned fruit in heavy syrup. Fruit in cream or butter sauce. Fried fruit. Limit coconut. Vegetables Vegetables cooked in cheese, cream, or butter sauce. Fried vegetables. Grains Breads made with saturated or trans fats, oils, or whole milk. Croissants. Sweet rolls. Donuts. High-fat crackers, such as cheese crackers and chips. Meats and other proteins Fatty meats, such as hot dogs, ribs, sausage, bacon, rib-eye roast or steak. High-fat deli meats, such as salami and bologna. Caviar. Domestic duck and goose. Organ meats, such as liver. Dairy Cream, sour cream, cream cheese, and creamed cottage cheese. Whole-milk cheeses. Whole or 2% milk (liquid, evaporated, or condensed). Whole buttermilk. Cream sauce or high-fat cheese sauce. Whole-milk yogurt. Fats and oils Meat fat, or shortening. Cocoa butter, hydrogenated oils, palm oil, coconut oil, palm kernel oil. Solid fats and shortenings, including bacon fat, salt pork, lard, and butter. Nondairy cream substitutes. Salad dressings with cheese or sour cream. Beverages Regular sodas and any drinks with added sugar. Sweets and desserts Frosting. Pudding. Cookies. Cakes. Pies. Milk chocolate or white chocolate. Buttered syrups. Full-fat ice cream or ice cream drinks. The items listed above may not be a complete list of foods and beverages to avoid. Contact a dietitian for more information. Summary Heart-healthy meal planning includes limiting unhealthy fats, increasing healthy fats, limiting salt (sodium) intake and making other diet and lifestyle changes. Lose weight if you are overweight. Losing just 5-10% of your body weight can help your overall health and prevent diseases such as diabetes and heart disease. Focus on eating a balance of foods, including fruits and vegetables, low-fat or nonfat dairy, lean protein, nuts and legumes, whole grains, and heart-healthy oils and fats. This information is not intended to replace advice given to  you by your health care provider. Make sure you discuss any questions you have with your health care provider. Document Revised: 04/10/2021 Document Reviewed: 04/10/2021 Elsevier Patient Education  2024 [employer].

## 2022-12-18 ENCOUNTER — Other Ambulatory Visit (HOSPITAL_COMMUNITY): Payer: Self-pay

## 2022-12-18 DIAGNOSIS — Z79899 Other long term (current) drug therapy: Secondary | ICD-10-CM | POA: Diagnosis not present

## 2022-12-18 MED ORDER — OXYCODONE HCL 20 MG PO TABS
1.0000 | ORAL_TABLET | Freq: Four times a day (QID) | ORAL | 0 refills | Status: DC | PRN
  Filled 2022-12-18: qty 120, 30d supply, fill #0

## 2022-12-19 ENCOUNTER — Other Ambulatory Visit (HOSPITAL_COMMUNITY): Payer: Self-pay

## 2022-12-24 DIAGNOSIS — Z79899 Other long term (current) drug therapy: Secondary | ICD-10-CM | POA: Diagnosis not present

## 2023-01-09 ENCOUNTER — Ambulatory Visit: Payer: 59 | Admitting: Physician Assistant

## 2023-01-09 NOTE — Progress Notes (Deleted)
Patient ID: Corey Hale, male   DOB: 1965-11-15, 57 y.o.   MRN: [id number]   After hospitalization 9/17-9/19 for NSTEMI.  Seen by cardiology in f/up 9/30(A&P to follow)  From discharge summary Principal Problem:   NSTEMI (non-ST elevated myocardial infarction) (HCC) Active Problems:   Tobacco abuse   Essential hypertension   Hyperlipidemia    Corey Hale is a 57 y.o. male with hypertension, prediabetes, dyslipidemia, GERD, tobacco dependence who was seen 12/05/2022 for the evaluation of chest pain. Corey Hale initially presented to [facility] emergency room for chest discomfort.  Stated that over the last 3 days prior to admission he had waxing and waning chest discomfort.  He thought it was related to gas as his discomfort seem to worsen with food intake.  Consequentially, he had essentially had minimal food or drink over the last 3 or so days.  His recurring chest pain has increased in intensity over the last 2 days which ultimately prompted him to present to the emergency room.  His pain worsened when lying down and improved when sitting upright.  Also worsens with deep inspiration. He stated that he has had similar pain in the past around 4-5 years ago.  He underwent evaluation which revealed that his discomfort was related to "gas pain".   On presentation to the emergency room, afebrile, HR 60s, SBP 105-125 millimeters of mercury, not requiring supplemental oxygen.  CBC unremarkable outside of WBC 15.2.  BMP with NA 127, K 2.4, creatinine 1.04, mag 2.1.  High-sensitivity troponin 8912-> 8232.  EKG with subtle ST elevation in the inferior leads but does not meet STEMI criteria.  He was given aspirin 324 mg, heparin 4000 units, morphine 4 mg, KCl 60 mEq p.o. and 20 mEq IV, famotidine 20 mg and started on a heparin drip and nitroglycerin drip.   Presentation to [facility], he stated 5/10 chest discomfort though appeared well.    Stated that he smoked 2-3 cigarettes for multiple years  and quit around 2 weeks ago.  Stated that he had a brother with cardiac disease who passed away in his 69s.  Both his parents had a stroke.   NSTEMI -- High-sensitivity troponin peaked at 8912.  Underwent cardiac catheterization noted above with large thrombus in the distal RCA.  It was felt he was a late presenting inferior STEMI given his started 4 days prior.  It was felt that the risk of PCI outweighed the benefit as he had already developed left-to-right collaterals.  Recommended for medical therapy.  Started on aspirin and Plavix as needed for 1 year. No recurrent chest pain.  Seen by cardiac rehab. 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  Follow-up echocardiogram showed LVEF of 60 to 65%, grade 2 diastolic dysfunction, hypokinesis of the LV, basal inferior septal and inferior lateral wall, normal RV, mildly dilated LA -- Continue metoprolol 12.5 mg twice daily   HTN -- Blood pressure soft, tolerating metoprolol 12.5 mg twice daily   HLD -- LDL 58, HDL 47 -- Continue atorvastatin 40 mg daily -- Will need LFT/FLP in 8 weeks   Hypokalemia -- K+ 3.3, supplemented prior to dc   Did the patient have an acute coronary syndrome (MI, NSTEMI, STEMI, etc) this admission?:  Yes  AHA/ACC Clinical Performance & Quality Measures: Aspirin prescribed? - Yes ADP Receptor Inhibitor (Plavix/Clopidogrel, Brilinta/Ticagrelor or Effient/Prasugrel) prescribed (includes medically managed patients)? - Yes Beta Blocker prescribed? - Yes High Intensity Statin (Lipitor 40-80mg  or Crestor 20-40mg ) prescribed? - Yes EF assessed during THIS hospitalization? - Yes For EF <40%, was ACEI/ARB prescribed? - Not Applicable (EF >/= 40%) For EF <40%, Aldosterone Antagonist (Spironolactone or Eplerenone) prescribed? - Not  Applicable (EF >/= 40%) Cardiac Rehab Phase II ordered (including medically managed patients)? - Yes   From 12/17/2022 cardiology appt: 1.  NSTEMI -no PCI needed due to collateral blood flow -Would recommend continuing current medication regimen which includes aspirin 81 mg daily, Lipitor 40 mg daily, Plavix 25 mg daily, colchicine 0.6 mg twice a day, Lopressor 12.5 mg twice a day, nitro as needed -no chest pain or SOB   2. HFmrEF -Euvolemic on exam -Continue current medication regimen   3.  HTN -Blood pressure well-controlled today 108/70 -No medication changes today -Would encourage low-sodium, heart healthy diet   4.  HLD -Recent lipid panel showed LDL 58, HDL 47, triglycerides 69 -would continue current medication regimen   5.  Hypokalemia (up to 3.3 on discharge) -check potassium today -he is not on any supplementation right now      Dispo: He can follow-up with Dr. Clifton James or APP in 3 months

## 2023-01-14 ENCOUNTER — Other Ambulatory Visit (HOSPITAL_COMMUNITY): Payer: Self-pay

## 2023-01-14 DIAGNOSIS — Z79899 Other long term (current) drug therapy: Secondary | ICD-10-CM | POA: Diagnosis not present

## 2023-01-14 MED ORDER — OXYCODONE HCL 20 MG PO TABS
1.0000 | ORAL_TABLET | Freq: Four times a day (QID) | ORAL | 0 refills | Status: DC | PRN
Start: 2023-01-14 — End: 2023-10-14
  Filled 2023-01-14 – 2023-01-16 (×2): qty 120, 30d supply, fill #0

## 2023-01-15 ENCOUNTER — Other Ambulatory Visit (HOSPITAL_COMMUNITY): Payer: Self-pay

## 2023-01-16 ENCOUNTER — Other Ambulatory Visit (HOSPITAL_COMMUNITY): Payer: Self-pay

## 2023-01-16 DIAGNOSIS — Z79899 Other long term (current) drug therapy: Secondary | ICD-10-CM | POA: Diagnosis not present

## 2023-01-17 ENCOUNTER — Other Ambulatory Visit: Payer: Self-pay

## 2023-01-21 ENCOUNTER — Other Ambulatory Visit: Payer: Self-pay

## 2023-01-22 ENCOUNTER — Other Ambulatory Visit: Payer: Self-pay

## 2023-01-29 ENCOUNTER — Other Ambulatory Visit: Payer: Self-pay | Admitting: Nurse Practitioner

## 2023-01-29 DIAGNOSIS — G8929 Other chronic pain: Secondary | ICD-10-CM

## 2023-01-30 MED ORDER — GABAPENTIN 600 MG PO TABS
300.0000 mg | ORAL_TABLET | Freq: Three times a day (TID) | ORAL | 1 refills | Status: DC
  Filled 2023-01-30 – 2023-03-05 (×10): qty 270, 90d supply, fill #0
  Filled 2023-05-24: qty 270, 90d supply, fill #1

## 2023-01-31 ENCOUNTER — Other Ambulatory Visit: Payer: Self-pay

## 2023-02-01 ENCOUNTER — Other Ambulatory Visit: Payer: Self-pay

## 2023-02-04 ENCOUNTER — Other Ambulatory Visit: Payer: Self-pay

## 2023-02-06 ENCOUNTER — Other Ambulatory Visit: Payer: Self-pay

## 2023-02-07 ENCOUNTER — Other Ambulatory Visit: Payer: Self-pay

## 2023-02-12 ENCOUNTER — Other Ambulatory Visit (HOSPITAL_COMMUNITY): Payer: Self-pay

## 2023-02-12 DIAGNOSIS — Z79899 Other long term (current) drug therapy: Secondary | ICD-10-CM | POA: Diagnosis not present

## 2023-02-12 MED ORDER — NALOXONE HCL 4 MG/0.1ML NA LIQD
NASAL | 1 refills | Status: DC
  Filled 2023-02-12: qty 2, 1d supply, fill #0

## 2023-02-12 MED ORDER — OXYCODONE HCL 20 MG PO TABS
1.0000 | ORAL_TABLET | Freq: Four times a day (QID) | ORAL | 0 refills | Status: DC | PRN
Start: 2023-02-12 — End: 2023-03-14
  Filled 2023-02-12 – 2023-02-13 (×3): qty 120, 30d supply, fill #0

## 2023-02-13 ENCOUNTER — Other Ambulatory Visit (HOSPITAL_COMMUNITY): Payer: Self-pay

## 2023-02-17 ENCOUNTER — Other Ambulatory Visit: Payer: Self-pay | Admitting: Cardiology

## 2023-02-19 ENCOUNTER — Other Ambulatory Visit: Payer: Self-pay

## 2023-02-20 ENCOUNTER — Other Ambulatory Visit: Payer: Self-pay

## 2023-02-20 MED ORDER — COLCHICINE 0.6 MG PO TABS
0.6000 mg | ORAL_TABLET | Freq: Two times a day (BID) | ORAL | 3 refills | Status: DC
  Filled 2023-02-20: qty 180, 90d supply, fill #0

## 2023-02-21 ENCOUNTER — Other Ambulatory Visit: Payer: Self-pay

## 2023-02-26 ENCOUNTER — Other Ambulatory Visit: Payer: Self-pay

## 2023-03-04 ENCOUNTER — Other Ambulatory Visit: Payer: Self-pay

## 2023-03-06 ENCOUNTER — Ambulatory Visit: Payer: Medicaid Other | Admitting: Physician Assistant

## 2023-03-06 ENCOUNTER — Other Ambulatory Visit: Payer: Self-pay

## 2023-03-12 ENCOUNTER — Other Ambulatory Visit: Payer: Self-pay

## 2023-03-14 ENCOUNTER — Other Ambulatory Visit (HOSPITAL_COMMUNITY): Payer: Self-pay

## 2023-03-14 DIAGNOSIS — Z79899 Other long term (current) drug therapy: Secondary | ICD-10-CM | POA: Diagnosis not present

## 2023-03-14 MED ORDER — OXYCODONE HCL 20 MG PO TABS
1.0000 | ORAL_TABLET | Freq: Four times a day (QID) | ORAL | 0 refills | Status: DC | PRN
  Filled 2023-03-14: qty 120, 30d supply, fill #0

## 2023-03-14 MED ORDER — NALOXONE HCL 4 MG/0.1ML NA LIQD
NASAL | 1 refills | Status: DC
  Filled 2023-03-14: qty 2, 1d supply, fill #0

## 2023-03-17 NOTE — Progress Notes (Deleted)
Cardiology Office Note    Patient Name: Corey Hale Date of Encounter: 03/17/2023  Primary Care Provider:  Claiborne Rigg, NP Primary Cardiologist:  Verne Carrow, MD Primary Electrophysiologist: None   Past Medical History    Past Medical History:  Diagnosis Date   Chronic back pain    Chronic sinusitis    nasal polyps, turbinate hypertrophy, deviated septum   Erectile dysfunction    Goiter    Herpes simplex    Hypercholesteremia    Hypertension    Perirectal abscess    Skin abscess    Thyroid disease    Tobacco dependence     History of Present Illness  Corey Hale is a 57 y.o. male with a PMH of NSTEMI s/p LHC with occluded mid to distal RCA with left-to-right collaterals and no PCI performed due to late presentation of MI, HLD, HTN, tobacco abuse, GERD who presents today for 83-month follow-up.  Corey Hale was seen initially in 12/05/2022 when he presented to the ED at [facility] with complaint of chest pain.  EKG was completed showing subtle ST elevation in inferior leads but did not meet STEMI criteria. Troponin 8912>> 8232 and patient was diagnosed with NSTEMI and transferred to [facility] for further evaluation.  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  He was started on colchicine due to late presenting pericarditis post MI.  2D echo was completed showing EF of 60 to 65% with no RWMA grade 2 DD with mild hypokinesis of the LVH and mildly dilated LA with no significant valve abnormalities.  He was started on ASA and Plavix along with atorvastatin and colchicine for 2 weeks.  He was seen in follow-up on 12/17/2022 by Corey Favre, PA and patient reported no issues since heart catheterization.  Reported smoking cessation and was congratulated.  His blood pressure was well-controlled and no medication changes were made at that visit.   During today's visit  the patient reports*** .  Patient denies chest pain, palpitations, dyspnea, PND, orthopnea, nausea, vomiting, dizziness, syncope, edema, weight gain, or early satiety.  ***Notes: -Last ischemic evaluation: -Last echo: -Interim ED visits: Review of Systems  Please see the history of present illness.    All other systems reviewed and are otherwise negative except as noted above.  Physical Exam    Wt Readings from Last 3 Encounters:  12/17/22 193 lb 6.4 oz (87.7 kg)  12/05/22 184 lb 8.4 oz (83.7 kg)  08/16/22 196 lb 12.8 oz (89.3 kg)   NG:EXBMW were no vitals filed for this visit.,There is no height or weight on file to calculate BMI. GEN: Well nourished, well developed in no acute distress Neck: No JVD; No carotid bruits Pulmonary: Clear to auscultation without rales, wheezing or rhonchi  Cardiovascular: Normal rate. Regular rhythm. Normal S1. Normal S2.   Murmurs: There is no murmur.  ABDOMEN: Soft, non-tender, non-distended EXTREMITIES:  No edema; No deformity   EKG/LABS/ Recent Cardiac Studies   ECG personally reviewed by me today - ***  Risk Assessment/Calculations:   {Does this patient have ATRIAL FIBRILLATION?:[id number]}      Lab Results  Component Value Date   WBC 10.6 (H) 12/06/2022   HGB 12.1 (L) 12/06/2022   HCT 36.3 (L) 12/06/2022   MCV 86.0 12/06/2022   PLT 170 12/06/2022   Lab Results  Component Value Date   CREATININE 0.70 (L) 12/17/2022  BUN 7 12/17/2022   NA 139 12/17/2022   K 4.9 12/17/2022   CL 101 12/17/2022   CO2 28 12/17/2022   Lab Results  Component Value Date   CHOL 119 12/05/2022   HDL 47 12/05/2022   LDLCALC 58 12/05/2022   TRIG 69 12/05/2022   CHOLHDL 2.5 12/05/2022    Lab Results  Component Value Date   HGBA1C 5.7 (H) 04/17/2022   Assessment & Plan    1.  History of CAD: -s/p NSTEMI with LHC showing severe one-vessel disease in mid to distal RCA with left-to-right collaterals and no PCI due to late presentation -Patient  started on colchicine due to late MI pericarditis  2.  Essential hypertension: -Patient's blood pressure today was***  3.  Hyperlipidemia: -Patient's last LDL cholesterol was***  4.  Tobacco abuse:      Disposition: Follow-up with Verne Carrow, MD or APP in *** months {Are you ordering a CV Procedure (e.g. stress test, cath, DCCV, TEE, etc)?   Press F2        :[id number]}   Signed, Corey Hale, Leodis Rains, NP 03/17/2023, 2:06 PM [facility]

## 2023-03-18 ENCOUNTER — Ambulatory Visit: Payer: Medicaid Other | Attending: Nurse Practitioner | Admitting: Nurse Practitioner

## 2023-03-18 DIAGNOSIS — E785 Hyperlipidemia, unspecified: Secondary | ICD-10-CM

## 2023-03-18 DIAGNOSIS — Z720 Tobacco use: Secondary | ICD-10-CM

## 2023-03-18 DIAGNOSIS — I2510 Atherosclerotic heart disease of native coronary artery without angina pectoris: Secondary | ICD-10-CM

## 2023-03-18 DIAGNOSIS — I214 Non-ST elevation (NSTEMI) myocardial infarction: Secondary | ICD-10-CM

## 2023-03-18 DIAGNOSIS — I10 Essential (primary) hypertension: Secondary | ICD-10-CM

## 2023-03-19 ENCOUNTER — Encounter: Payer: Self-pay | Admitting: Nurse Practitioner

## 2023-03-19 DIAGNOSIS — Z79899 Other long term (current) drug therapy: Secondary | ICD-10-CM | POA: Diagnosis not present

## 2023-03-21 ENCOUNTER — Other Ambulatory Visit: Payer: Self-pay

## 2023-03-25 ENCOUNTER — Other Ambulatory Visit: Payer: Self-pay

## 2023-03-27 ENCOUNTER — Other Ambulatory Visit: Payer: Self-pay

## 2023-04-12 ENCOUNTER — Other Ambulatory Visit (HOSPITAL_COMMUNITY): Payer: Self-pay

## 2023-04-12 DIAGNOSIS — Z79899 Other long term (current) drug therapy: Secondary | ICD-10-CM | POA: Diagnosis not present

## 2023-04-12 MED ORDER — OXYCODONE HCL 20 MG PO TABS
1.0000 | ORAL_TABLET | Freq: Four times a day (QID) | ORAL | 0 refills | Status: DC | PRN
  Filled 2023-04-12: qty 120, 30d supply, fill #0

## 2023-04-14 NOTE — Progress Notes (Unsigned)
Cardiology Office Note    Patient Name: Corey Hale Date of Encounter: 04/15/2023  Primary Care Provider:  Claiborne Rigg, NP Primary Cardiologist:  Verne Carrow, MD Primary Electrophysiologist: None   Past Medical History    Past Medical History:  Diagnosis Date   Chronic back pain    Chronic sinusitis    nasal polyps, turbinate hypertrophy, deviated septum   Erectile dysfunction    Goiter    Herpes simplex    Hypercholesteremia    Hypertension    Perirectal abscess    Skin abscess    Thyroid disease    Tobacco dependence     History of Present Illness  Corey Hale is a 58 y.o. male with a PMH of NSTEMI s/p LHC with occluded mid to distal RCA with left-to-right collaterals and no PCI performed due to late presentation of MI, HLD, HTN, tobacco abuse, GERD who presents today for 71-month follow-up.  Corey Hale was seen initially in 12/05/2022 when he presented to the ED at [facility] with complaint of chest pain.  EKG was completed showing subtle ST elevation in inferior leads but did not meet STEMI criteria. Troponin 8912>> 8232 and patient was diagnosed with NSTEMI and transferred to [facility] for further evaluation.  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  He was started on colchicine due to late presenting pericarditis post MI.  2D echo was completed showing EF of 60 to 65% with no RWMA grade 2 DD with mild hypokinesis of the LVH and mildly dilated LA with no significant valve abnormalities.  He was started on ASA and Plavix along with atorvastatin and colchicine for 2 weeks.  He was seen in follow-up on 12/17/2022 by Jari Favre, PA and patient reported no issues since heart catheterization.  Reported smoking cessation and was congratulated.  His blood pressure was well-controlled and no medication changes were made at that visit.  Corey Hale presents today  for 76-month follow-up.  During today's visit patient reports episodes of chest pain or angina.  He has been compliant with his current medications and denies any adverse reactions.  His blood pressure today was well-controlled at 122/82.  He reports taking metoprolol once daily due to forgetting second dose. He also was not sure if he had discontinued colchicine. He is currently working on staying active and reports that he is still smoking but working on reducing his amount. Patient denies chest pain, palpitations, dyspnea, PND, orthopnea, nausea, vomiting, dizziness, syncope, edema, weight gain, or early satiety.   Review of Systems  Please see the history of present illness.    All other systems reviewed and are otherwise negative except as noted above.  Physical Exam    Wt Readings from Last 3 Encounters:  04/15/23 200 lb 12.8 oz (91.1 kg)  12/17/22 193 lb 6.4 oz (87.7 kg)  12/05/22 184 lb 8.4 oz (83.7 kg)   VS: Vitals:   04/15/23 1016  BP: 122/82  Pulse: 77  SpO2: 98%  ,Body mass index is 27.23 kg/m. GEN: Well nourished, well developed in no acute distress Neck: No JVD; No carotid bruits Pulmonary: Clear to auscultation without rales, wheezing or rhonchi  Cardiovascular: Normal rate. Regular rhythm. Normal S1. Normal S2.   Murmurs: There is no murmur.  ABDOMEN: Soft, non-tender, non-distended EXTREMITIES:  No edema; No deformity   EKG/LABS/ Recent Cardiac Studies   ECG personally reviewed  by me today -none completed today  Risk Assessment/Calculations:          Lab Results  Component Value Date   WBC 10.6 (H) 12/06/2022   HGB 12.1 (L) 12/06/2022   HCT 36.3 (L) 12/06/2022   MCV 86.0 12/06/2022   PLT 170 12/06/2022   Lab Results  Component Value Date   CREATININE 0.70 (L) 12/17/2022   BUN 7 12/17/2022   NA 139 12/17/2022   K 4.9 12/17/2022   CL 101 12/17/2022   CO2 28 12/17/2022   Lab Results  Component Value Date   CHOL 119 12/05/2022   HDL 47 12/05/2022    LDLCALC 58 12/05/2022   TRIG 69 12/05/2022   CHOLHDL 2.5 12/05/2022    Lab Results  Component Value Date   HGBA1C 5.7 (H) 04/17/2022   Assessment & Plan    1.  History of CAD: -s/p NSTEMI with LHC showing severe one-vessel disease in mid to distal RCA with left-to-right collaterals and no PCI due to late presentation -Patient started on colchicine due to late MI pericarditis and was advised to discontinue in 2 weeks.   -Continue current GDMT with Lipitor 40 mg, ASA 81 mg, Toprol-XL 25 mg Nitrostat 0.4 mg as needed  2.  Essential hypertension: -Patient's blood pressure today was well-controlled today at 122/82 -Patient was taking metoprolol titrate once daily due to forgetting second dose and noting elevated BP in the evening. -Stop metoprolol tartrate and start Toprol-XL 25 mg once daily -Patient advised to monitor BP over the next 2 weeks and contact office with results.  3.  Hyperlipidemia: -Patient's last LDL cholesterol was at goal at 58 -Continue Lipitor 40 mg daily -Patient advised to increase physical activity to at least 150 minutes/week.  4.  Tobacco abuse: -Patient reports that he has started smoking again at least 1 to 2 cigarettes/day. -He is continue to work on cessation.  Disposition: Follow-up with Verne Carrow, MD or APP in 6 months   Signed, Napoleon Form, Leodis Rains, NP 04/15/2023, 10:46 AM [facility]

## 2023-04-15 ENCOUNTER — Other Ambulatory Visit: Payer: Self-pay

## 2023-04-15 ENCOUNTER — Ambulatory Visit: Payer: Medicaid Other | Attending: Nurse Practitioner | Admitting: Nurse Practitioner

## 2023-04-15 ENCOUNTER — Encounter: Payer: Self-pay | Admitting: Nurse Practitioner

## 2023-04-15 VITALS — BP 122/82 | HR 77 | Ht 72.0 in | Wt 200.8 lb

## 2023-04-15 VITALS — BP 125/82 | HR 70 | Ht 72.0 in | Wt 201.2 lb

## 2023-04-15 DIAGNOSIS — E785 Hyperlipidemia, unspecified: Secondary | ICD-10-CM

## 2023-04-15 DIAGNOSIS — G8929 Other chronic pain: Secondary | ICD-10-CM | POA: Diagnosis not present

## 2023-04-15 DIAGNOSIS — F5101 Primary insomnia: Secondary | ICD-10-CM

## 2023-04-15 DIAGNOSIS — I10 Essential (primary) hypertension: Secondary | ICD-10-CM | POA: Diagnosis not present

## 2023-04-15 DIAGNOSIS — Z720 Tobacco use: Secondary | ICD-10-CM | POA: Diagnosis not present

## 2023-04-15 DIAGNOSIS — I214 Non-ST elevation (NSTEMI) myocardial infarction: Secondary | ICD-10-CM

## 2023-04-15 DIAGNOSIS — M546 Pain in thoracic spine: Secondary | ICD-10-CM

## 2023-04-15 DIAGNOSIS — R7303 Prediabetes: Secondary | ICD-10-CM | POA: Diagnosis not present

## 2023-04-15 DIAGNOSIS — R0981 Nasal congestion: Secondary | ICD-10-CM

## 2023-04-15 DIAGNOSIS — Z1211 Encounter for screening for malignant neoplasm of colon: Secondary | ICD-10-CM

## 2023-04-15 DIAGNOSIS — E7800 Pure hypercholesterolemia, unspecified: Secondary | ICD-10-CM

## 2023-04-15 DIAGNOSIS — D649 Anemia, unspecified: Secondary | ICD-10-CM | POA: Diagnosis not present

## 2023-04-15 MED ORDER — DULOXETINE HCL 30 MG PO CPEP
30.0000 mg | ORAL_CAPSULE | Freq: Every day | ORAL | 3 refills | Status: DC
  Filled 2023-04-15: qty 60, 30d supply, fill #0

## 2023-04-15 MED ORDER — DULOXETINE HCL 30 MG PO CPEP
30.0000 mg | ORAL_CAPSULE | Freq: Every day | ORAL | 0 refills | Status: DC
  Filled 2023-04-15: qty 90, 90d supply, fill #0

## 2023-04-15 MED ORDER — TRAZODONE HCL 100 MG PO TABS
100.0000 mg | ORAL_TABLET | Freq: Every day | ORAL | 1 refills | Status: DC
  Filled 2023-04-15: qty 90, 90d supply, fill #0
  Filled 2023-06-19 – 2023-07-10 (×2): qty 90, 90d supply, fill #1

## 2023-04-15 MED ORDER — ATORVASTATIN CALCIUM 40 MG PO TABS
40.0000 mg | ORAL_TABLET | Freq: Every day | ORAL | 1 refills | Status: DC
  Filled 2023-04-15 – 2023-05-31 (×2): qty 90, 90d supply, fill #0
  Filled 2023-08-29: qty 90, 90d supply, fill #1

## 2023-04-15 MED ORDER — METOPROLOL SUCCINATE ER 25 MG PO TB24
25.0000 mg | ORAL_TABLET | Freq: Every day | ORAL | 3 refills | Status: DC
  Filled 2023-04-15: qty 90, 90d supply, fill #0
  Filled 2023-07-10: qty 90, 90d supply, fill #1
  Filled 2023-10-10: qty 90, 90d supply, fill #2

## 2023-04-15 NOTE — Patient Instructions (Signed)
Corey Hale [address] [place], [place] [address] ?PH# [phone] ?

## 2023-04-15 NOTE — Progress Notes (Signed)
Assessment & Plan:  Corey Hale was seen today for medication refill.  Diagnoses and all orders for this visit:  Primary insomnia -     traZODone (DESYREL) 100 MG tablet; Take 1 tablet (100 mg total) by mouth at bedtime. FOR SLEEP  Chronic midline thoracic back pain -     DULoxetine (CYMBALTA) 30 MG capsule; Take 1 capsule (30 mg total) by mouth daily.  Nasal congestion -     Ambulatory referral to ENT  Prediabetes -     CMP14+EGFR -     Hemoglobin A1c  Hypercholesterolemia -     atorvastatin (LIPITOR) 40 MG tablet; Take 1 tablet (40 mg total) by mouth daily. INSTRUCTIONS: Work on a low fat, heart healthy diet and participate in regular aerobic exercise program by working out at least 150 minutes per week; 5 days a week-30 minutes per day. Avoid red meat/beef/steak,  fried foods. junk foods, sodas, sugary drinks, unhealthy snacking, alcohol and smoking.  Drink at least 80 oz of water per day and monitor your carbohydrate intake daily.    Anemia, unspecified type -     CBC with Differential -     Ambulatory referral to Gastroenterology  Colon cancer screening -     Ambulatory referral to Gastroenterology    Patient has been counseled on age-appropriate routine health concerns for screening and prevention. These are reviewed and up-to-date. Referrals have been placed accordingly. Immunizations are up-to-date or declined.    Subjective:   Chief Complaint  Patient presents with   Medication Refill    Corey Hale 58 y.o. male presents to office today for follow up. I have not seen him in this office in several months (07-2022). He had an office visit with cardiology today.   He has a past medical history of Chronic back pain (Followed by PAIN MANAGEMENT), Chronic sinusitis, Erectile dysfunction, Goiter, Herpes labialis, Hypercholesteremia, Hypertension, Perirectal abscess, Skin abscess, Thyroid disease, and Tobacco dependence.   Patient has been counseled on age-appropriate  routine health concerns for screening and prevention. These are reviewed and up-to-date. Referrals have been placed accordingly. Immunizations are up-to-date or declined.     COLONOSCOPY: overdue. Referral placed  Corey Hale states he is having difficulty staying asleep. Falls asleep for a few hours and then is unable to go back to sleep once he wakes up. During the night he is having trouble breathing. He would like to see ENT for this as he has a history of nasal septoplasty a few years ago.    He is currently seeing pain management for chronic low back pain. Does not feel gabapentin is effective in relieving his pain. He is currently also prescribed oxycodone. Would like to try an alternative medication instead of gabapentin.     Review of Systems  Constitutional:  Negative for fever, malaise/fatigue and weight loss.  HENT:  Positive for congestion and hearing loss. Negative for nosebleeds.   Eyes: Negative.  Negative for blurred vision, double vision and photophobia.  Respiratory: Negative.  Negative for cough and shortness of breath.   Cardiovascular: Negative.  Negative for chest pain, palpitations and leg swelling.  Gastrointestinal: Negative.  Negative for heartburn, nausea and vomiting.  Musculoskeletal:  Positive for back pain. Negative for myalgias.  Neurological: Negative.  Negative for dizziness, focal weakness, seizures and headaches.  Psychiatric/Behavioral:  Positive for memory loss. Negative for suicidal ideas. The patient has insomnia.     Past Medical History:  Diagnosis Date   Chronic back pain  Chronic sinusitis    nasal polyps, turbinate hypertrophy, deviated septum   Erectile dysfunction    Goiter    Herpes simplex    Hypercholesteremia    Hypertension    Perirectal abscess    Skin abscess    Thyroid disease    Tobacco dependence     Past Surgical History:  Procedure Laterality Date   ARM HARDWARE REMOVAL Right    INCISION AND DRAINAGE PERIRECTAL  ABSCESS N/A 07/22/2017   Procedure: IRRIGATION, DEBRIDEMENT AND DRAINAGE PERIRECTAL ABSCESS;  Surgeon: Gaynelle Adu, MD;  Location: [facility] OR;  Service: General;  Laterality: N/A;   LEFT HEART CATH AND CORONARY ANGIOGRAPHY N/A 12/05/2022   Procedure: LEFT HEART CATH AND CORONARY ANGIOGRAPHY;  Surgeon: Iran Ouch, MD;  Location: [facility] IN[facility]ASIVE [facility] LAB;  Service: Cardiovascular;  Laterality: N/A;   NASAL SEPTOPLASTY W/ TURBINOPLASTY Bilateral 11/13/2019   Procedure: NASAL SEPTOPLASTY WITH TURBINATE REDUCTION;  Surgeon: Christia Reading, MD;  Location: [facility] OR;  Service: ENT;  Laterality: Bilateral;   NECK SURGERY     PAROTIDECTOMY     Dr Jenne Pane   SINUS ENDO WITH FUSION Bilateral 11/13/2019   Procedure: SINUS ENDO WITH FUSION;  Surgeon: Christia Reading, MD;  Location: [facility] OR;  Service: ENT;  Laterality: Bilateral;   THYROIDECTOMY, PARTIAL  2008   Dr Carolynne Edouard    Family History  Problem Relation Age of Onset   Stroke Mother    Stroke Father     Social History Reviewed with no changes to be made today.   Outpatient Medications Prior to Visit  Medication Sig Dispense Refill   aspirin 81 MG tablet Take 1 tablet (81 mg total) by mouth daily. 90 tablet 3   clopidogrel (PLAVIX) 75 MG tablet Take 1 tablet (75 mg total) by mouth daily with breakfast. 90 tablet 2   colchicine 0.6 MG tablet Take 1 tablet (0.6 mg total) by mouth 2 (two) times daily. 180 tablet 3   gabapentin (NEURONTIN) 600 MG tablet Take 0.5-1 tablets (300-600 mg total) by mouth 3 (three) times daily. 270 tablet 1   metoprolol succinate (TOPROL XL) 25 MG 24 hr tablet Take 1 tablet (25 mg total) by mouth daily. 90 tablet 3   naloxone (NARCAN) nasal spray 4 mg/0.1 mL Place 1 (one) spray into nostril as directed as needed. 2 each 1   naloxone (NARCAN) nasal spray 4 mg/0.1 mL Use 1 (one) spray in nostril as needed as directed. 2 each 1   nitroGLYCERIN (NITROSTAT) 0.4 MG SL tablet Place 1 tablet (0.4 mg total) under the tongue every 5 (five) minutes as  needed. 25 tablet 2   Oxycodone HCl 20 MG TABS Take 1 Tablet by mouth four times daily, as needed 28 tablet 0   Oxycodone HCl 20 MG TABS Take 1 Tablet by mouth 4 times daily, as needed 120 tablet 0   Oxycodone HCl 20 MG TABS Take 1 Tablet by mouth four times daily, as needed 120 tablet 0   Oxycodone HCl 20 MG TABS Take 1 Tablet by mouth four times daily, as needed 120 tablet 0   valACYclovir (VALTREX) 1000 MG tablet Take 1 tablet (1,000 mg total) by mouth daily. 90 tablet 3   atorvastatin (LIPITOR) 40 MG tablet Take 1 tablet (40 mg total) by mouth daily. 90 tablet 1   No facility-administered medications prior to visit.    Allergies  Allergen Reactions   Pork-Derived Products     No pork products; pt is a Muslim  Objective:    BP (!) 146/94 (BP Location: Left Arm, Patient Position: Sitting, Cuff Size: Normal)   Pulse 70   Wt 201 lb 3.2 oz (91.3 kg)   SpO2 97%   BMI 27.29 kg/m  Wt Readings from Last 3 Encounters:  04/15/23 201 lb 3.2 oz (91.3 kg)  04/15/23 200 lb 12.8 oz (91.1 kg)  12/17/22 193 lb 6.4 oz (87.7 kg)    Physical Exam Vitals and nursing note reviewed.  Constitutional:      Appearance: He is well-developed.  HENT:     Head: Normocephalic and atraumatic.     Ears:     Comments: Bilateral hearing aids present Cardiovascular:     Rate and Rhythm: Normal rate and regular rhythm.     Heart sounds: Normal heart sounds. No murmur heard.    No friction rub. No gallop.  Pulmonary:     Effort: Pulmonary effort is normal. No tachypnea or respiratory distress.     Breath sounds: Normal breath sounds. No decreased breath sounds, wheezing, rhonchi or rales.  Chest:     Chest wall: No tenderness.  Abdominal:     General: Bowel sounds are normal.     Palpations: Abdomen is soft.  Musculoskeletal:        General: Normal range of motion.     Cervical back: Normal range of motion.  Skin:    General: Skin is warm and dry.  Neurological:     Mental Status: He is  alert and oriented to person, place, and time.     Coordination: Coordination normal.  Psychiatric:        Behavior: Behavior normal. Behavior is cooperative.        Thought Content: Thought content normal.        Judgment: Judgment normal.          Patient has been counseled extensively about nutrition and exercise as well as the importance of adherence with medications and regular follow-up. The patient was given clear instructions to go to ER or return to medical center if symptoms don't improve, worsen or new problems develop. The patient verbalized understanding.   Follow-up: Return in about 3 months (around 07/14/2023) for physical.   Claiborne Rigg, FNP-BC [facility] [place], [place] [phone]   04/15/2023, 3:35 PM

## 2023-04-15 NOTE — Patient Instructions (Addendum)
Medication Instructions:   START TAKING:  TOPROL XL 25 MG ONCE A DAY   STOP TAKING AND REMOVE THIS MEDICATION FROM YOUR MEDICATION LIST:  METOPROLOL LOPRESSOR   PLEASE CHECK MEDICINES AT HOME TO SEE IF TAKING COLCHICINE IF YOU ARE PLEASE STOP AND REMOVE FROM MEDICINE LIST.   *If you need a refill on your cardiac medications before your next appointment, please call your pharmacy*   Lab Work: NONE ORDERED  TODAY    If you have labs (blood work) drawn today and your tests are completely normal, you will receive your results only by: MyChart Message (if you have MyChart) OR A paper copy in the mail If you have any lab test that is abnormal or we need to change your treatment, we will call you to review the results.   Testing/Procedures:    Follow-Up: At [facility], you and your health needs are our priority.  As part of our continuing mission to provide you with exceptional heart care, we have created designated Provider Care Teams.  These Care Teams include your primary Cardiologist (physician) and Advanced Practice Providers (APPs -  Physician Assistants and Nurse Practitioners) who all work together to provide you with the care you need, when you need it.  We recommend signing up for the patient portal called "MyChart".  Sign up information is provided on this After Visit Summary.  MyChart is used to connect with patients for Virtual Visits (Telemedicine).  Patients are able to view lab/test results, encounter notes, upcoming appointments, etc.  Non-urgent messages can be sent to your provider as well.   To learn more about what you can do with MyChart, go to [email or web address].    Your next appointment:   6 month(s)  Provider:   Verne Carrow, MD  Asa Lente    Other Instructions

## 2023-04-16 LAB — CBC WITH DIFFERENTIAL/PLATELET
Basophils Absolute: 0.1 10*3/uL (ref 0.0–0.2)
Basos: 2 %
EOS (ABSOLUTE): 1.3 10*3/uL — ABNORMAL HIGH (ref 0.0–0.4)
Eos: 16 %
Hematocrit: 41.9 % (ref 37.5–51.0)
Hemoglobin: 13.9 g/dL (ref 13.0–17.7)
Immature Grans (Abs): 0 10*3/uL (ref 0.0–0.1)
Immature Granulocytes: 0 %
Lymphocytes Absolute: 2.4 10*3/uL (ref 0.7–3.1)
Lymphs: 29 %
MCH: 28.7 pg (ref 26.6–33.0)
MCHC: 33.2 g/dL (ref 31.5–35.7)
MCV: 87 fL (ref 79–97)
Monocytes Absolute: 0.7 10*3/uL (ref 0.1–0.9)
Monocytes: 8 %
Neutrophils Absolute: 3.6 10*3/uL (ref 1.4–7.0)
Neutrophils: 45 %
Platelets: 241 10*3/uL (ref 150–450)
RBC: 4.84 x10E6/uL (ref 4.14–5.80)
RDW: 13.6 % (ref 11.6–15.4)
WBC: 8.1 10*3/uL (ref 3.4–10.8)

## 2023-04-16 LAB — CMP14+EGFR
ALT: 14 [IU]/L (ref 0–44)
AST: 14 [IU]/L (ref 0–40)
Albumin: 4.3 g/dL (ref 3.8–4.9)
Alkaline Phosphatase: 88 [IU]/L (ref 44–121)
BUN/Creatinine Ratio: 11 (ref 9–20)
BUN: 9 mg/dL (ref 6–24)
Bilirubin Total: 0.3 mg/dL (ref 0.0–1.2)
CO2: 25 mmol/L (ref 20–29)
Calcium: 9.3 mg/dL (ref 8.7–10.2)
Chloride: 99 mmol/L (ref 96–106)
Creatinine, Ser: 0.84 mg/dL (ref 0.76–1.27)
Globulin, Total: 2.8 g/dL (ref 1.5–4.5)
Glucose: 99 mg/dL (ref 70–99)
Potassium: 4.3 mmol/L (ref 3.5–5.2)
Sodium: 138 mmol/L (ref 134–144)
Total Protein: 7.1 g/dL (ref 6.0–8.5)
eGFR: 102 mL/min/{1.73_m2} (ref >59)

## 2023-04-16 LAB — HEMOGLOBIN A1C
Est. average glucose Bld gHb Est-mCnc: 120 mg/dL
Hgb A1c MFr Bld: 5.8 % — ABNORMAL HIGH (ref 4.8–5.6)

## 2023-04-17 DIAGNOSIS — Z79899 Other long term (current) drug therapy: Secondary | ICD-10-CM | POA: Diagnosis not present

## 2023-04-18 ENCOUNTER — Other Ambulatory Visit (HOSPITAL_COMMUNITY): Payer: Self-pay

## 2023-04-18 ENCOUNTER — Encounter: Payer: Self-pay | Admitting: Nurse Practitioner

## 2023-04-18 DIAGNOSIS — J342 Deviated nasal septum: Secondary | ICD-10-CM | POA: Diagnosis not present

## 2023-04-18 DIAGNOSIS — T485X5A Adverse effect of other anti-common-cold drugs, initial encounter: Secondary | ICD-10-CM | POA: Diagnosis not present

## 2023-04-18 DIAGNOSIS — Z9889 Other specified postprocedural states: Secondary | ICD-10-CM | POA: Diagnosis not present

## 2023-04-18 DIAGNOSIS — J339 Nasal polyp, unspecified: Secondary | ICD-10-CM | POA: Diagnosis not present

## 2023-04-18 DIAGNOSIS — J310 Chronic rhinitis: Secondary | ICD-10-CM | POA: Diagnosis not present

## 2023-04-18 DIAGNOSIS — J343 Hypertrophy of nasal turbinates: Secondary | ICD-10-CM | POA: Diagnosis not present

## 2023-04-18 MED ORDER — PREDNISONE 10 MG PO TABS
ORAL_TABLET | ORAL | 0 refills | Status: DC
  Filled 2023-04-18: qty 30, 12d supply, fill #0

## 2023-05-10 ENCOUNTER — Other Ambulatory Visit (HOSPITAL_COMMUNITY): Payer: Self-pay

## 2023-05-10 DIAGNOSIS — Z79899 Other long term (current) drug therapy: Secondary | ICD-10-CM | POA: Diagnosis not present

## 2023-05-10 MED ORDER — OXYCODONE HCL 20 MG PO TABS
1.0000 | ORAL_TABLET | Freq: Four times a day (QID) | ORAL | 0 refills | Status: DC | PRN
  Filled 2023-05-10: qty 120, 30d supply, fill #0

## 2023-05-15 DIAGNOSIS — T485X5A Adverse effect of other anti-common-cold drugs, initial encounter: Secondary | ICD-10-CM | POA: Diagnosis not present

## 2023-05-15 DIAGNOSIS — R0981 Nasal congestion: Secondary | ICD-10-CM | POA: Diagnosis not present

## 2023-05-15 DIAGNOSIS — Z9889 Other specified postprocedural states: Secondary | ICD-10-CM | POA: Diagnosis not present

## 2023-05-15 DIAGNOSIS — H6123 Impacted cerumen, bilateral: Secondary | ICD-10-CM | POA: Diagnosis not present

## 2023-05-15 DIAGNOSIS — H9313 Tinnitus, bilateral: Secondary | ICD-10-CM | POA: Diagnosis not present

## 2023-05-15 DIAGNOSIS — J339 Nasal polyp, unspecified: Secondary | ICD-10-CM | POA: Diagnosis not present

## 2023-05-15 DIAGNOSIS — J343 Hypertrophy of nasal turbinates: Secondary | ICD-10-CM | POA: Diagnosis not present

## 2023-05-15 DIAGNOSIS — J310 Chronic rhinitis: Secondary | ICD-10-CM | POA: Diagnosis not present

## 2023-05-15 DIAGNOSIS — J342 Deviated nasal septum: Secondary | ICD-10-CM | POA: Diagnosis not present

## 2023-05-20 ENCOUNTER — Other Ambulatory Visit: Payer: Self-pay

## 2023-05-24 ENCOUNTER — Other Ambulatory Visit: Payer: Self-pay

## 2023-05-31 ENCOUNTER — Other Ambulatory Visit: Payer: Self-pay

## 2023-06-07 ENCOUNTER — Other Ambulatory Visit (HOSPITAL_COMMUNITY): Payer: Self-pay

## 2023-06-07 DIAGNOSIS — Z79899 Other long term (current) drug therapy: Secondary | ICD-10-CM | POA: Diagnosis not present

## 2023-06-07 MED ORDER — NALOXONE HCL 4 MG/0.1ML NA LIQD
NASAL | 1 refills | Status: DC
Start: 2023-06-07 — End: 2023-07-16

## 2023-06-07 MED ORDER — OXYCODONE HCL 20 MG PO TABS
20.0000 mg | ORAL_TABLET | Freq: Four times a day (QID) | ORAL | 0 refills | Status: DC | PRN
  Filled 2023-06-07: qty 120, 30d supply, fill #0

## 2023-06-11 ENCOUNTER — Other Ambulatory Visit (HOSPITAL_COMMUNITY): Payer: Self-pay

## 2023-06-17 ENCOUNTER — Encounter: Payer: Self-pay | Admitting: Physician Assistant

## 2023-06-19 ENCOUNTER — Other Ambulatory Visit: Payer: Self-pay

## 2023-06-22 ENCOUNTER — Other Ambulatory Visit (HOSPITAL_BASED_OUTPATIENT_CLINIC_OR_DEPARTMENT_OTHER): Payer: Self-pay

## 2023-06-24 ENCOUNTER — Other Ambulatory Visit: Payer: Self-pay

## 2023-07-01 ENCOUNTER — Other Ambulatory Visit (HOSPITAL_BASED_OUTPATIENT_CLINIC_OR_DEPARTMENT_OTHER): Payer: Self-pay

## 2023-07-01 ENCOUNTER — Other Ambulatory Visit (HOSPITAL_COMMUNITY): Payer: Self-pay

## 2023-07-01 DIAGNOSIS — Z79899 Other long term (current) drug therapy: Secondary | ICD-10-CM | POA: Diagnosis not present

## 2023-07-01 MED ORDER — OXYCODONE HCL 20 MG PO TABS
1.0000 | ORAL_TABLET | Freq: Four times a day (QID) | ORAL | 0 refills | Status: DC | PRN
  Filled 2023-07-01 – 2023-07-05 (×2): qty 120, 30d supply, fill #0

## 2023-07-01 MED ORDER — NALOXONE HCL 4 MG/0.1ML NA LIQD
NASAL | 1 refills | Status: DC
Start: 2023-07-01 — End: 2023-10-14
  Filled 2023-07-01: qty 2, 1d supply, fill #0

## 2023-07-05 ENCOUNTER — Other Ambulatory Visit (HOSPITAL_COMMUNITY): Payer: Self-pay

## 2023-07-09 DIAGNOSIS — Z79899 Other long term (current) drug therapy: Secondary | ICD-10-CM | POA: Diagnosis not present

## 2023-07-11 ENCOUNTER — Other Ambulatory Visit: Payer: Self-pay

## 2023-07-16 ENCOUNTER — Other Ambulatory Visit (HOSPITAL_COMMUNITY)
Admission: RE | Admit: 2023-07-16 | Discharge: 2023-07-16 | Disposition: A | Discharge status: Home / Self Care | Source: Ambulatory Visit | Attending: Nurse Practitioner | Admitting: Nurse Practitioner

## 2023-07-16 ENCOUNTER — Encounter: Payer: Self-pay | Admitting: Nurse Practitioner

## 2023-07-16 ENCOUNTER — Ambulatory Visit: Payer: Medicaid Other | Attending: Nurse Practitioner | Admitting: Nurse Practitioner

## 2023-07-16 ENCOUNTER — Other Ambulatory Visit: Payer: Self-pay

## 2023-07-16 VITALS — BP 120/80 | HR 80 | Resp 19 | Ht 72.0 in | Wt 197.0 lb

## 2023-07-16 DIAGNOSIS — H9313 Tinnitus, bilateral: Secondary | ICD-10-CM | POA: Diagnosis not present

## 2023-07-16 DIAGNOSIS — Z9889 Other specified postprocedural states: Secondary | ICD-10-CM | POA: Diagnosis not present

## 2023-07-16 DIAGNOSIS — G8929 Other chronic pain: Secondary | ICD-10-CM | POA: Diagnosis not present

## 2023-07-16 DIAGNOSIS — K611 Rectal abscess: Secondary | ICD-10-CM

## 2023-07-16 DIAGNOSIS — R361 Hematospermia: Secondary | ICD-10-CM | POA: Insufficient documentation

## 2023-07-16 DIAGNOSIS — J339 Nasal polyp, unspecified: Secondary | ICD-10-CM | POA: Diagnosis not present

## 2023-07-16 DIAGNOSIS — R7303 Prediabetes: Secondary | ICD-10-CM

## 2023-07-16 DIAGNOSIS — M62432 Contracture of muscle, left forearm: Secondary | ICD-10-CM | POA: Diagnosis not present

## 2023-07-16 DIAGNOSIS — H903 Sensorineural hearing loss, bilateral: Secondary | ICD-10-CM | POA: Diagnosis not present

## 2023-07-16 DIAGNOSIS — M546 Pain in thoracic spine: Secondary | ICD-10-CM

## 2023-07-16 DIAGNOSIS — J343 Hypertrophy of nasal turbinates: Secondary | ICD-10-CM | POA: Diagnosis not present

## 2023-07-16 MED ORDER — AMOXICILLIN-POT CLAVULANATE 875-125 MG PO TABS
1.0000 | ORAL_TABLET | Freq: Two times a day (BID) | ORAL | 0 refills | Status: AC
  Filled 2023-07-16: qty 14, 7d supply, fill #0

## 2023-07-16 MED ORDER — GABAPENTIN 600 MG PO TABS
300.0000 mg | ORAL_TABLET | Freq: Three times a day (TID) | ORAL | 1 refills | Status: DC
  Filled 2023-07-16 – 2023-08-21 (×7): qty 270, 90d supply, fill #0
  Filled 2023-12-03: qty 270, 90d supply, fill #1

## 2023-07-16 NOTE — Progress Notes (Signed)
 Assessment & Plan:  Corey Hale was seen today for prediabetes.  Diagnoses and all orders for this visit:  Prediabetes -     Hemoglobin A1c  Chronic midline thoracic back pain -     gabapentin  (NEURONTIN ) 600 MG tablet; Take 0.5-1 tablets (300-600 mg total) by mouth 3 (three) times daily.  Perirectal abscess -     Ambulatory referral to General Surgery -     amoxicillin -clavulanate (AUGMENTIN ) 875-125 MG tablet; Take 1 tablet by mouth 2 (two) times daily for 7 days. -     CBC with Differential  Blood in semen -     PSA -     Urinalysis, Complete -     Urine cytology ancillary only -     Ambulatory referral to Urology  Contracture of muscle of left forearm -     Ambulatory referral to Physical Therapy    Patient has been counseled on age-appropriate routine health concerns for screening and prevention. These are reviewed and up-to-date. Referrals have been placed accordingly. Immunizations are up-to-date or declined.    Subjective:   Chief Complaint  Patient presents with   Prediabetes    Corey Hale 58 y.o. male presents to office today for follow up to prediabetes.  He has a past medical history of Chronic back pain (Followed by PAIN MANAGEMENT), Chronic sinusitis, Erectile dysfunction, Goiter, Herpes labialis, Hypercholesteremia, Hypertension, Perirectal abscess, Skin abscess, Thyroid  disease, and Tobacco dependence.    Patient has been counseled on age-appropriate routine health concerns for screening and prevention. These are reviewed and up-to-date. Referrals have been placed accordingly. Immunizations are up-to-date or declined.     COLONOSCOPY: overdue. Referral placed. Appointment scheduled    He is currently seeing pain management for chronic low back pain. Requesting gabapentin  today. He is currently also prescribed oxycodone .    He noticed blood and semen mixed together with ejaculation when having sex with his spouse. Last occurrence was 4 days ago and then  a few weeks prior.    He can not extend his left arm fully. States he has been unable to extend for over a year. Believes it came from prolonged driving as he works in [employer].   He has a history of perirectal abscesses with I&D. Last surgery was 07-22-2017 requiring ID and drainage under anesthesia. He report swelling, erythema and pain along with purulent drainage from the same area over the past several days. On exam the area below the left inner buttock is tender to touch with foul smelling drainage from a previous surgical incision site.    Review of Systems  Constitutional:  Negative for fever, malaise/fatigue and weight loss.  HENT: Negative.  Negative for nosebleeds.   Eyes: Negative.  Negative for blurred vision, double vision and photophobia.  Respiratory: Negative.  Negative for cough and shortness of breath.   Cardiovascular: Negative.  Negative for chest pain, palpitations and leg swelling.  Gastrointestinal: Negative.  Negative for heartburn, nausea and vomiting.  Genitourinary:        SEE HPI  Musculoskeletal:  Positive for back pain and myalgias.       CHRONIC BACK PAIN  Skin:        SEE HPI  Neurological: Negative.  Negative for dizziness, focal weakness, seizures and headaches.  Psychiatric/Behavioral: Negative.  Negative for suicidal ideas.     Past Medical History:  Diagnosis Date   Chronic back pain    Chronic sinusitis    nasal polyps, turbinate hypertrophy, deviated septum  Erectile dysfunction    Goiter    Herpes simplex    Hypercholesteremia    Hypertension    Perirectal abscess    Skin abscess    Thyroid  disease    Tobacco dependence     Past Surgical History:  Procedure Laterality Date   ARM HARDWARE REMOVAL Right    INCISION AND DRAINAGE PERIRECTAL ABSCESS N/A 07/22/2017   Procedure: IRRIGATION, DEBRIDEMENT AND DRAINAGE PERIRECTAL ABSCESS;  Surgeon: Aldean Hummingbird, MD;  Location: [facility] OR;  Service: General;  Laterality: N/A;   LEFT HEART CATH AND  CORONARY ANGIOGRAPHY N/A 12/05/2022   Procedure: LEFT HEART CATH AND CORONARY ANGIOGRAPHY;  Surgeon: Wenona Hamilton, MD;  Location: [facility];  Service: Cardiovascular;  Laterality: N/A;   NASAL SEPTOPLASTY W/ TURBINOPLASTY Bilateral 11/13/2019   Procedure: NASAL SEPTOPLASTY WITH TURBINATE REDUCTION;  Surgeon: Virgina Grills, MD;  Location: [facility] OR;  Service: ENT;  Laterality: Bilateral;   NECK SURGERY     PAROTIDECTOMY     Dr Tellis Feathers   SINUS ENDO WITH FUSION Bilateral 11/13/2019   Procedure: SINUS ENDO WITH FUSION;  Surgeon: Virgina Grills, MD;  Location: [facility] OR;  Service: ENT;  Laterality: Bilateral;   THYROIDECTOMY, PARTIAL  2008   Dr Alethea Andes    Family History  Problem Relation Age of Onset   Stroke Mother    Stroke Father     Social History Reviewed with no changes to be made today.   Outpatient Medications Prior to Visit  Medication Sig Dispense Refill   aspirin  81 MG tablet Take 1 tablet (81 mg total) by mouth daily. 90 tablet 3   atorvastatin  (LIPITOR) 40 MG tablet Take 1 tablet (40 mg total) by mouth daily. 90 tablet 1   clopidogrel  (PLAVIX ) 75 MG tablet Take 1 tablet (75 mg total) by mouth daily with breakfast. 90 tablet 2   metoprolol  succinate (TOPROL  XL) 25 MG 24 hr tablet Take 1 tablet (25 mg total) by mouth daily. 90 tablet 3   Oxycodone  HCl 20 MG TABS Take 1 Tablet by mouth four times daily, as needed 28 tablet 0   colchicine  0.6 MG tablet Take 1 tablet (0.6 mg total) by mouth 2 (two) times daily. (Patient not taking: Reported on 07/16/2023) 180 tablet 3   naloxone  (NARCAN ) nasal spray 4 mg/0.1 mL Use 1 (one) spray as needed. (Patient not taking: Reported on 07/16/2023) 2 each 1   nitroGLYCERIN  (NITROSTAT ) 0.4 MG SL tablet Place 1 tablet (0.4 mg total) under the tongue every 5 (five) minutes as needed. (Patient not taking: Reported on 07/16/2023) 25 tablet 2   Oxycodone  HCl 20 MG TABS Take 1 Tablet by mouth 4 times daily, as needed (Patient not taking: Reported on 07/16/2023)  120 tablet 0   Oxycodone  HCl 20 MG TABS Take 1 Tablet by mouth four times daily, as needed (Patient not taking: Reported on 07/16/2023) 120 tablet 0   Oxycodone  HCl 20 MG TABS Take 1 Tablet by mouth four times daily, as needed (Patient not taking: Reported on 07/16/2023) 120 tablet 0   traZODone  (DESYREL ) 100 MG tablet Take 1 tablet (100 mg total) by mouth at bedtime. FOR SLEEP (Patient not taking: Reported on 07/16/2023) 90 tablet 1   valACYclovir  (VALTREX ) 1000 MG tablet Take 1 tablet (1,000 mg total) by mouth daily. (Patient not taking: Reported on 07/16/2023) 90 tablet 3   DULoxetine  (CYMBALTA ) 30 MG capsule Take 1 capsule (30 mg total) by mouth daily. (Patient not taking: Reported on 07/16/2023) 90 capsule  0   gabapentin  (NEURONTIN ) 600 MG tablet Take 0.5-1 tablets (300-600 mg total) by mouth 3 (three) times daily. (Patient not taking: Reported on 07/16/2023) 270 tablet 1   naloxone  (NARCAN ) nasal spray 4 mg/0.1 mL Place 1 (one) spray into nostril as directed as needed. (Patient not taking: Reported on 07/16/2023) 2 each 1   naloxone  (NARCAN ) nasal spray 4 mg/0.1 mL Use 1 (one) spray in nostril as needed as directed. (Patient not taking: Reported on 07/16/2023) 2 each 1   naloxone  (NARCAN ) nasal spray 4 mg/0.1 mL Use 1 (one) spray in one nostril as needed. (Patient not taking: Reported on 07/16/2023) 2 each 1   predniSONE  (DELTASONE ) 10 MG tablet Take 4 tablets by mouth daily for 3 days, THEN 3 tablets daily for 3 days, THEN 2 tablets daily for 3 days, THEN 1 tablet daily for 3 days. (Patient not taking: Reported on 07/16/2023) 30 tablet 0   No facility-administered medications prior to visit.    Allergies  Allergen Reactions   Pork-Derived Products     No pork products; pt is a Muslim       Objective:    BP 120/80 (BP Location: Left Arm, Patient Position: Sitting, Cuff Size: Normal)   Pulse 80   Resp 19   Ht 6' (1.829 m)   Wt 197 lb (89.4 kg)   SpO2 100%   BMI 26.72 kg/m  Wt Readings from  Last 3 Encounters:  07/16/23 197 lb (89.4 kg)  04/15/23 201 lb 3.2 oz (91.3 kg)  04/15/23 200 lb 12.8 oz (91.1 kg)    Physical Exam Vitals and nursing note reviewed.  Constitutional:      Appearance: He is well-developed.  HENT:     Head: Normocephalic and atraumatic.  Cardiovascular:     Rate and Rhythm: Normal rate and regular rhythm.     Heart sounds: Normal heart sounds. No murmur heard.    No friction rub. No gallop.  Pulmonary:     Effort: Pulmonary effort is normal. No tachypnea or respiratory distress.     Breath sounds: Normal breath sounds. No decreased breath sounds, wheezing, rhonchi or rales.  Chest:     Chest wall: No tenderness.  Abdominal:     General: Bowel sounds are normal.     Palpations: Abdomen is soft.  Musculoskeletal:     Left elbow: Decreased range of motion.     Left forearm: Deformity present.     Cervical back: Normal range of motion.  Skin:    General: Skin is warm and dry.     Findings: Abscess present.       Neurological:     Mental Status: He is alert and oriented to person, place, and time.     Coordination: Coordination normal.  Psychiatric:        Behavior: Behavior normal. Behavior is cooperative.        Thought Content: Thought content normal.        Judgment: Judgment normal.          Patient has been counseled extensively about nutrition and exercise as well as the importance of adherence with medications and regular follow-up. The patient was given clear instructions to go to ER or return to medical center if symptoms don't improve, worsen or new problems develop. The patient verbalized understanding.   Follow-up: Return in about 6 months (around 01/15/2024).   Collins Dean, FNP-BC [facility] [place], [place] [phone]   07/16/2023, 12:22  PM

## 2023-07-17 LAB — URINALYSIS, COMPLETE
Bilirubin, UA: NEGATIVE
Glucose, UA: NEGATIVE
Ketones, UA: NEGATIVE
Leukocytes,UA: NEGATIVE
Nitrite, UA: NEGATIVE
Protein,UA: NEGATIVE
RBC, UA: NEGATIVE
Specific Gravity, UA: 1.019 (ref 1.005–1.030)
Urobilinogen, Ur: 0.2 mg/dL (ref 0.2–1.0)
pH, UA: 6 (ref 5.0–7.5)

## 2023-07-17 LAB — CBC WITH DIFFERENTIAL/PLATELET
Basophils Absolute: 0.1 10*3/uL (ref 0.0–0.2)
Basos: 1 %
EOS (ABSOLUTE): 0.4 10*3/uL (ref 0.0–0.4)
Eos: 6 %
Hematocrit: 43.6 % (ref 37.5–51.0)
Hemoglobin: 14.1 g/dL (ref 13.0–17.7)
Immature Grans (Abs): 0 10*3/uL (ref 0.0–0.1)
Immature Granulocytes: 0 %
Lymphocytes Absolute: 1.8 10*3/uL (ref 0.7–3.1)
Lymphs: 29 %
MCH: 29.3 pg (ref 26.6–33.0)
MCHC: 32.3 g/dL (ref 31.5–35.7)
MCV: 91 fL (ref 79–97)
Monocytes Absolute: 0.5 10*3/uL (ref 0.1–0.9)
Monocytes: 8 %
Neutrophils Absolute: 3.5 10*3/uL (ref 1.4–7.0)
Neutrophils: 56 %
Platelets: 204 10*3/uL (ref 150–450)
RBC: 4.82 x10E6/uL (ref 4.14–5.80)
RDW: 13.1 % (ref 11.6–15.4)
WBC: 6.2 10*3/uL (ref 3.4–10.8)

## 2023-07-17 LAB — PSA: Prostate Specific Ag, Serum: 0.5 ng/mL (ref 0.0–4.0)

## 2023-07-17 LAB — MICROSCOPIC EXAMINATION
Bacteria, UA: NONE SEEN
Casts: NONE SEEN /LPF
Epithelial Cells (non renal): NONE SEEN /HPF (ref 0–10)
WBC, UA: NONE SEEN /HPF (ref 0–5)

## 2023-07-17 LAB — URINE CYTOLOGY ANCILLARY ONLY
Chlamydia: NEGATIVE
Comment: NEGATIVE
Comment: NEGATIVE
Comment: NORMAL
Neisseria Gonorrhea: NEGATIVE
Trichomonas: NEGATIVE

## 2023-07-17 LAB — HEMOGLOBIN A1C
Est. average glucose Bld gHb Est-mCnc: 111 mg/dL
Hgb A1c MFr Bld: 5.5 % (ref 4.8–5.6)

## 2023-07-29 ENCOUNTER — Other Ambulatory Visit: Payer: Self-pay

## 2023-07-29 ENCOUNTER — Other Ambulatory Visit: Payer: Self-pay | Admitting: Nurse Practitioner

## 2023-07-29 DIAGNOSIS — K219 Gastro-esophageal reflux disease without esophagitis: Secondary | ICD-10-CM

## 2023-07-29 MED ORDER — FAMOTIDINE 40 MG PO TABS
40.0000 mg | ORAL_TABLET | Freq: Every day | ORAL | 1 refills | Status: DC
Start: 2023-07-29 — End: 2024-01-21
  Filled 2023-07-29: qty 90, 90d supply, fill #0
  Filled 2023-12-03: qty 90, 90d supply, fill #1

## 2023-07-30 ENCOUNTER — Other Ambulatory Visit: Payer: Self-pay

## 2023-07-31 ENCOUNTER — Other Ambulatory Visit: Payer: Self-pay

## 2023-07-31 NOTE — Therapy (Addendum)
 OUTPATIENT PHYSICAL THERAPY UPPER EXTREMITY EVALUATION   Patient Name: Corey Hale MRN: [id number] DOB:1965/05/30, 58 y.o., male Today's Date: 08/05/2023  END OF SESSION:  PT End of Session - 08/05/23 1447     Visit Number 1    Date for PT Re-Evaluation 09/30/23    Authorization Type [employer] Medicaid    PT Start Time 1447    PT Stop Time 1531    PT Time Calculation (min) 44 min    Activity Tolerance Patient tolerated treatment well    Behavior During Therapy WFL for tasks assessed/performed             Past Medical History:  Diagnosis Date   Chronic back pain    Chronic sinusitis    nasal polyps, turbinate hypertrophy, deviated septum   Erectile dysfunction    Goiter    Herpes simplex    Hypercholesteremia    Hypertension    Perirectal abscess    Skin abscess    Thyroid  disease    Tobacco dependence    Past Surgical History:  Procedure Laterality Date   ARM HARDWARE REMOVAL Right    INCISION AND DRAINAGE PERIRECTAL ABSCESS N/A 07/22/2017   Procedure: IRRIGATION, DEBRIDEMENT AND DRAINAGE PERIRECTAL ABSCESS;  Surgeon: Aldean Hummingbird, MD;  Location: [facility] OR;  Service: General;  Laterality: N/A;   LEFT HEART CATH AND CORONARY ANGIOGRAPHY N/A 12/05/2022   Procedure: LEFT HEART CATH AND CORONARY ANGIOGRAPHY;  Surgeon: Wenona Hamilton, MD;  Location: [facility];  Service: Cardiovascular;  Laterality: N/A;   NASAL SEPTOPLASTY W/ TURBINOPLASTY Bilateral 11/13/2019   Procedure: NASAL SEPTOPLASTY WITH TURBINATE REDUCTION;  Surgeon: Virgina Grills, MD;  Location: [facility] OR;  Service: ENT;  Laterality: Bilateral;   NECK SURGERY     PAROTIDECTOMY     Dr Tellis Feathers   SINUS ENDO WITH FUSION Bilateral 11/13/2019   Procedure: SINUS ENDO WITH FUSION;  Surgeon: Virgina Grills, MD;  Location: [facility] OR;  Service: ENT;  Laterality: Bilateral;   THYROIDECTOMY, PARTIAL  2008   Dr Alethea Andes   Patient Active Problem List   Diagnosis Date Noted   NSTEMI (non-ST elevated myocardial infarction) (HCC)  12/04/2022   Prediabetes 09/26/2021   Bilateral impacted cerumen 09/26/2021   Subjective tinnitus of both ears 07/24/2019   Bilateral sensorineural hearing loss 01/01/2019   Hyperlipidemia 07/21/2017   History of abscess of skin and subcutaneous tissue 07/21/2017   Mid back pain, chronic 05/26/2015   Chronic midline low back pain without sciatica 05/26/2015   Midline thoracic back pain 03/07/2015   History of vertebral fracture 05/20/2014   Essential hypertension 02/23/2014   Lipoma of face 09/07/2013   Hearing loss 09/07/2013   Dental caries 06/10/2012   Erectile dysfunction 06/10/2012   Tobacco abuse 06/10/2012   Personal history of goiter 06/10/2012   Genital herpes 03/05/2012   Thoracic back pain 07/21/2009   THYROID  NODULE, HX OF 07/21/2009    PCP: Collins Dean, NP   REFERRING PROVIDER: Collins Dean, NP   REFERRING DIAG: [phone] (ICD-10-CM) - Contracture of muscle of left forearm   THERAPY DIAG:  Stiffness of left elbow, not elsewhere classified  Abnormal posture  RATIONALE FOR EVALUATION AND TREATMENT: Rehabilitation  ONSET DATE: ~2-3 yrs  NEXT MD VISIT: 01/15/24   SUBJECTIVE:  SUBJECTIVE STATEMENT: Pt reports his L elbow has been unable full straighten for 2-3 years.  He cannot recall any MOI, but states he does a lot of driving and thinks it may related to his arm position while driving.  No pain normally but did have some pain a few days ago in proximal wrist flexor group/medial epicondyle after carrying something heavy but now resolved.  He denies any awareness of weakness.   PAIN: Are you having pain? No  PERTINENT HISTORY:  Chronic thoracic and low back pain, h/o vertebral fracture, neck surgery, HTN, NSTEMI, prediabetes, HOH/hearing loss - getting  hearing aids in August   PRECAUTIONS: None  HAND DOMINANCE: Right  RED FLAGS: None  WEIGHT BEARING RESTRICTIONS: No  FALLS:  Has patient fallen in last 6 months? 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  PLOF: Independent and Leisure: mostly sedentary  PATIENT GOALS: "To get my elbow straight"   OBJECTIVE: (objective measures completed at initial evaluation unless otherwise dated)  DIAGNOSTIC FINDINGS:  DG of L humerus and shoulder ordered for 08/16/23  PATIENT SURVEYS:  Quick Dash 0.0 / 100 = 0.0 %  COGNITION: Overall cognitive status: Within functional limits for tasks assessed     SENSATION: WFL  POSTURE: Mild forward head and rounded shoulders  UPPER EXTREMITY ROM:   Active ROM Right eval Left eval  Shoulder flexion    Shoulder extension    Shoulder abduction    Shoulder adduction    Shoulder internal rotation    Shoulder external rotation    Elbow flexion 140 126  Elbow extension 0 19  Wrist flexion    Wrist extension    Wrist ulnar deviation    Wrist radial deviation    Wrist pronation 95 78  Wrist supination 98 84  (Blank rows = not tested)  UPPER EXTREMITY MMT: B shoulders. Elbows, forearms and wrists 5/5 MMT Right eval Left eval  Grip strength (lbs) 65 # 73 #  (Blank rows = not tested)  JOINT MOBILITY TESTING:  Limited joint mobility at ulnohumeral joint, radiocapitellar joint and proximal radioulnar joint  PALPATION:  TTP over L wrist flexor group and supinator but not medial epicondyle. Increased muscle tension in L wrist extensors but not TTP. TTP in L biceps.    TODAY'S TREATMENT:   08/05/2023  SELF CARE:  Reviewed eval findings and role of PT in addressing identified deficits as well as instruction in initial HEP (see below).   PATIENT EDUCATION:  Education details: PT eval findings, anticipated POC, and initial  HEP  Person educated: Patient Education method: Explanation, Demonstration, Tactile cues, Verbal cues, and Handouts Education comprehension: verbalized understanding, returned demonstration, verbal cues required, tactile cues required, and needs further education  HOME EXERCISE PROGRAM: Access Code: [email or web address] URL: [email or web address] Date: 08/05/2023 Prepared by: Felecia Hopper  Exercises - Seated Wrist Extension Stretch  - 1-2 x daily - 7 x weekly - 3 reps - 30 sec hold - Seated Wrist Flexion Stretch  - 1-2 x daily - 7 x weekly - 3 reps - 30 sec hold - Anterior Shoulder and Biceps Stretch  - 1-2 x daily - 7 x weekly - 3 reps - 30 sec hold - Bicep Stretch at Table  - 1-2 x daily - 7 x weekly - 3 reps - 30 sec hold - Doorway Pec Stretch at 60 Degrees Abduction with Arm Straight (Mirrored)  - 1-2 x  daily - 7 x weekly - 3 reps - 30 sec hold - Forearm Pronation and Supination with Hammer  - 1 x daily - 7 x weekly - 2 sets - 10 reps - 5 sec hold   ASSESSMENT:  CLINICAL IMPRESSION: Corey Hale is a 58 y.o. male who was referred to physical therapy for evaluation and treatment for L elbow flexion contracture.  Patient denies pain most of the time but did experience brief increased pain after lifting something heavy a few days ago which is now resolved.  Patient has deficits in L elbow and forearm ROM, L UE flexibility, abnormal posture, and TTP with abnormal muscle tension in L biceps, supinator and wrist flexor muscle group which are interfering with ADLs and are impacting quality of life.  On QuickDASH patient scored 0/100 demonstrating 0% perceived disability.  Meredith will benefit from skilled PT to address above deficits to improve mobility and activity tolerance with decreased pain interference.  OBJECTIVE IMPAIRMENTS: decreased activity tolerance, decreased knowledge of condition, decreased ROM, hypomobility, increased fascial restrictions, impaired perceived functional  ability, increased muscle spasms, impaired flexibility, improper body mechanics, and postural dysfunction.   ACTIVITY LIMITATIONS: carrying and lifting  PARTICIPATION LIMITATIONS: driving and occupation  PERSONAL FACTORS: Past/current experiences, Profession, Time since onset of injury/illness/exacerbation, and 3+ comorbidities: Chronic thoracic and low back pain, h/o vertebral fracture, neck surgery, HTN, NSTEMI, prediabetes, HOH/hearing loss - getting hearing aids in August  are also affecting patient's functional outcome.   REHAB POTENTIAL: Good  CLINICAL DECISION MAKING: Evolving/moderate complexity  EVALUATION COMPLEXITY: Moderate   GOALS: Goals reviewed with patient? Yes  SHORT TERM GOALS: Target date: 09/02/2023  Patient will be independent with initial HEP to improve outcomes and carryover.  Baseline:  Goal status: INITIAL  2.  Patient to improve L elbow extension AROM to <10 without pain provocation to allow for increased ease of ADLs.  Baseline: L elbow extension 19 Goal status: INITIAL  LONG TERM GOALS: Target date: 09/30/2023  Patient will be independent with ongoing/advanced HEP for self-management at home.  Baseline:  Goal status: INITIAL  2.  Patient to demonstrate improved upright posture with posterior shoulder girdle engaged to promote improved functional UE use. Baseline:  Goal status: INITIAL  3.  Patient to improve L elbow flexion and extension AROM to [facility] without pain provocation to allow for increased ease of ADLs.  Baseline: L elbow 19-126 Goal status: INITIAL  4.  Patient to report ability to perform ADLs, household, and work-related tasks without limitation due to L elbow pain, LOM or weakness. Baseline:  Goal status: INITIAL   PLAN:  PT FREQUENCY: 1x/week  PT DURATION: 6-8 weeks  PLANNED INTERVENTIONS: 82956- PT Re-evaluation, 97750- Physical Performance Testing, 97110-Therapeutic exercises, 97530- Therapeutic activity, [id number]  Neuromuscular re-education, 97535- Self Care, 21308- Manual therapy, [id number]- Electrical stimulation (unattended), 97016- Vasopneumatic device, 97035- Ultrasound, 65784- Ionotophoresis 4mg /ml Dexamethasone , Patient/Family education, Taping, Dry Needling, Joint mobilization, Cryotherapy, and Moist heat  PLAN FOR NEXT SESSION: Review initial HEP; MT +/- TPDN to address abnormal muscle tension in supinator, biceps and wrist flexor group; L elbow extension ROM and stretching/strengthening   Francisco Irving, PT 08/05/2023, 6:37 PM

## 2023-08-01 ENCOUNTER — Other Ambulatory Visit (HOSPITAL_COMMUNITY): Payer: Self-pay

## 2023-08-01 MED ORDER — OXYCODONE HCL 20 MG PO TABS
1.0000 | ORAL_TABLET | Freq: Four times a day (QID) | ORAL | 0 refills | Status: DC | PRN
  Filled 2023-08-01 – 2023-08-02 (×2): qty 120, 30d supply, fill #0

## 2023-08-02 ENCOUNTER — Other Ambulatory Visit (HOSPITAL_COMMUNITY): Payer: Self-pay

## 2023-08-02 ENCOUNTER — Other Ambulatory Visit: Payer: Self-pay

## 2023-08-02 DIAGNOSIS — Z79899 Other long term (current) drug therapy: Secondary | ICD-10-CM | POA: Diagnosis not present

## 2023-08-02 DIAGNOSIS — Z6826 Body mass index (BMI) 26.0-26.9, adult: Secondary | ICD-10-CM | POA: Diagnosis not present

## 2023-08-02 DIAGNOSIS — M5030 Other cervical disc degeneration, unspecified cervical region: Secondary | ICD-10-CM | POA: Diagnosis not present

## 2023-08-05 ENCOUNTER — Other Ambulatory Visit: Payer: Self-pay

## 2023-08-05 ENCOUNTER — Ambulatory Visit: Attending: Nurse Practitioner | Admitting: Physical Therapy

## 2023-08-05 DIAGNOSIS — M25622 Stiffness of left elbow, not elsewhere classified: Secondary | ICD-10-CM | POA: Diagnosis present

## 2023-08-05 DIAGNOSIS — R293 Abnormal posture: Secondary | ICD-10-CM | POA: Insufficient documentation

## 2023-08-05 DIAGNOSIS — M62432 Contracture of muscle, left forearm: Secondary | ICD-10-CM | POA: Insufficient documentation

## 2023-08-08 NOTE — Progress Notes (Unsigned)
 08/13/2023 Corey Hale [id number] 1965-10-19  Referring provider: Collins Dean, NP Primary GI doctor: Dr. Yvone Hale  ASSESSMENT AND PLAN:  IDA 07/16/2023  HGB 14.1 MCV 91 Platelets 204 Recent Labs    12/04/22 2048 12/05/22 0600 12/06/22 1032 04/15/23 1536 07/16/23 1125  HGB 13.4 12.2* 12.1* 13.9 14.1  No iron, ferritin or B12 checked, never started on iron  Anemia in Sept coincides with heart cath in sept - check iron ,ferritin, B12 - schedule colonoscopy at this time to rule out IBD, consider EGD if true iron def, may have been related to Corey Hale  GERD x 10 years Well controlled on pepcid  40 mg, no dysphagia, no AB pain, no NSAIDS, no ETOH, remote smoking -Lifestyle changes discussed, avoid NSAIDS, ETOH, hand out given to the patient - consider EGD if true anemia but at this time I do not want to delay the colonoscopy - check celiac and H pylori  History of large perirectal abscess, continuing drainage per patient, now having blood in ejaculation, abnormal rectal exam with possible stenosis, some concern for IBD Recurrent 2014, 2018 2019. 07/21/2017 left gluteal fold extensive inflammation and abscess associated tract to skin surface Status post debridement and drainage with Dr. 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  Patient understands that there is a low but real risk of cardiovascular event such as heart attack, stroke, or embolism /  thrombosis, or ischemia while off Plavix .  The patient consents to proceed.  Will communicate by phone or EMR with patient's prescribing provider to confirm that holding Plavix  is reasonable in this case.   Blood in his ejaculation No air in urine, no testicular pain/swelling Has appointment with Dr. Joie Hale -Will get CT AB and pelvis with contrast, some concern for fistula  I have reviewed the clinic note as outlined by Corey Cull, PA and agree with the assessment, plan and medical decision making.  Corey Hale is referred to the office for evaluation of anemia.  Had evidence of slightly low hemoglobin in the setting of heart catheterization when he experienced a myocardial infarction last fall.  CBC was normal in April 2029.  Agree with obtaining iron studies.  He has had a history of a perirectal abscess diagnosed in 2019 and has had ongoing intermittent symptoms related to it.  This raises the specter of possible IBD.  Evaluating laboratory inflammatory markers, CTAP and colonoscopy are reasonable.  Agree that if labs show concern for ongoing anemia can consider adding an EGD to colonoscopy.  Corey Hess, MD   Patient Care Team: Corey Dean, NP as PCP - General (Nurse Practitioner) Corey Benne, MD as PCP - Cardiology (Cardiology) Corey Grills, MD as Consulting Physician (Otolaryngology) Corey Glimpse, NP as Nurse Practitioner  HISTORY OF PRESENT ILLNESS: 58 y.o. male with  a past medical history listed below presents for evaluation of anemia, perirectal abscess history.   Discussed the use of AI scribe software for clinical note transcription with the patient, who gave verbal consent to proceed.  History of Present Illness   Corey Hale is a 58 year old male with anemia who presents for evaluation of low blood count. He  was referred by his primary care physician for evaluation of anemia.  He has a history of anemia with low hemoglobin levels noted in September and January, but normal levels in April. He has not been on any iron supplements or medications for anemia and has not donated blood recently. No dark or black stools, blood in stools, or changes in bowel habits such as diarrhea. But he can have constipation on occasion and has to take stool softeners.  He experienced a heart problem in December 05 2022, which led to chest pain and required hospitalization. He underwent catheterization for both left and right heart s/p PCI RCA. He is currently on a plavix  and takes aspirin  81 mg. No shortness of breath or chest pain currently.  He mentions experiencing blood mixed with ejaculation about two months ago, which led him to avoid sexual activity with his wife. He has been referred to urology for this issue.He denies abdominal pain, testicular pain, and swelling.  He has a history of an abscess on his buttock, which was surgically treated in 2019. He continues to experience issues with the abscess, including liquid drainage, but denies fevers or chills. He uses a stool softener occasionally to manage constipation  He has a long-standing history of heartburn for over ten years, which he manages with famotidine  and dietary adjustments. He denies any difficulty swallowing or food getting caught. He does not use over-the-counter pain medications like Aleve  or ibuprofen , aside from aspirin .  He denies alcohol use and has a history of smoking, though he indicates he  quit smoking. Family history includes his father having high blood pressure and diabetes. He denies any family history of autoimmune diseases.      He  reports that he has quit smoking. His smoking use included cigarettes. He has a 66 pack-year smoking history. He has never used smokeless tobacco. He reports that he does not drink alcohol and does not use  drugs.  RELEVANT GI HISTORY, IMAGING AND LABS: Results   LABS Hemoglobin: low (11/2022) Hemoglobin: low (03/2023) Hemoglobin: normal (06/2023)  DIAGNOSTIC Cardiac catheterization: performed on left and right heart (12/05/2022)  PATHOLOGY Perirectal abscess: left buttock, no active drainage, possible induration superiorly (08/13/2023)      CBC    Component Value Date/Time   WBC 6.2 07/16/2023 1125   WBC 10.6 (H) 12/06/2022 1032   RBC 4.82 07/16/2023 1125   RBC 4.22 12/06/2022 1032   HGB 14.1 07/16/2023 1125   HCT 43.6 07/16/2023 1125   PLT 204 07/16/2023 1125   MCV 91 07/16/2023 1125   MCH 29.3 07/16/2023 1125   MCH 28.7 12/06/2022 1032   MCHC 32.3 07/16/2023 1125   MCHC 33.3 12/06/2022 1032   RDW 13.1 07/16/2023 1125   LYMPHSABS 1.8 07/16/2023 1125   MONOABS 1.3 (H) 07/21/2017 1620   EOSABS 0.4 07/16/2023 1125   BASOSABS 0.1 07/16/2023 1125   Recent Labs    12/04/22 2048 12/05/22 0600 12/06/22 1032 04/15/23 1536 07/16/23 1125  HGB 13.4 12.2* 12.1* 13.9 14.1    CMP     Component Value Date/Time   NA 138 04/15/2023 1536  K 4.3 04/15/2023 1536   CL 99 04/15/2023 1536   CO2 25 04/15/2023 1536   GLUCOSE 99 04/15/2023 1536   GLUCOSE 100 (H) 12/06/2022 1032   BUN 9 04/15/2023 1536   CREATININE 0.84 04/15/2023 1536   CREATININE 0.89 02/23/2014 0935   CALCIUM  9.3 04/15/2023 1536   PROT 7.1 04/15/2023 1536   ALBUMIN 4.3 04/15/2023 1536   AST 14 04/15/2023 1536   ALT 14 04/15/2023 1536   ALKPHOS 88 04/15/2023 1536   BILITOT 0.3 04/15/2023 1536   GFRNONAA >60 12/06/2022 1032   GFRNONAA >89 02/23/2014 0935   GFRAA >60 11/13/2019 0613   GFRAA >89 02/23/2014 0935      Latest Ref Rng & Units 04/15/2023    3:36 PM 12/05/2022    6:00 AM 04/17/2022   12:01 PM  Hepatic Function  Total Protein 6.0 - 8.5 g/dL 7.1  6.7  7.7   Albumin 3.8 - 4.9 g/dL 4.3  3.3  4.8   AST 0 - 40 IU/L 14  55  11   ALT 0 - 44 IU/L 14  23  12    Alk Phosphatase 44 - 121 IU/L 88  47  69    Total Bilirubin 0.0 - 1.2 mg/dL 0.3  0.8  0.3   Bilirubin, Direct 0.0 - 0.2 mg/dL  0.2        Current Medications:    Current Outpatient Medications (Cardiovascular):    atorvastatin  (LIPITOR) 40 MG tablet, Take 1 tablet (40 mg total) by mouth daily.   metoprolol  succinate (TOPROL  XL) 25 MG 24 hr tablet, Take 1 tablet (25 mg total) by mouth daily.   nitroGLYCERIN  (NITROSTAT ) 0.4 MG SL tablet, Place 1 tablet (0.4 mg total) under the tongue every 5 (five) minutes as needed.   Current Outpatient Medications (Analgesics):    aspirin  81 MG tablet, Take 1 tablet (81 mg total) by mouth daily.   colchicine  0.6 MG tablet, Take 1 tablet (0.6 mg total) by mouth 2 (two) times daily.   Oxycodone  HCl 20 MG TABS, Take 1 Tablet by mouth four times daily, as needed   Oxycodone  HCl 20 MG TABS, Take 1 Tablet by mouth 4 times daily, as needed   Oxycodone  HCl 20 MG TABS, Take 1 Tablet by mouth four times daily, as needed   Oxycodone  HCl 20 MG TABS, Take 1 tablet (20 mg total) by mouth 4 (four) times daily as needed.  Current Outpatient Medications (Hematological):    clopidogrel  (PLAVIX ) 75 MG tablet, Take 1 tablet (75 mg total) by mouth daily with breakfast.  Current Outpatient Medications (Other):    famotidine  (PEPCID ) 40 MG tablet, Take 1 tablet (40 mg total) by mouth daily. For acid reflux   gabapentin  (NEURONTIN ) 600 MG tablet, Take 0.5-1 tablets (300-600 mg total) by mouth 3 (three) times daily.   naloxone  (NARCAN ) nasal spray 4 mg/0.1 mL, Use 1 (one) spray as needed.   traZODone  (DESYREL ) 100 MG tablet, Take 1 tablet (100 mg total) by mouth at bedtime. FOR SLEEP   valACYclovir  (VALTREX ) 1000 MG tablet, Take 1 tablet (1,000 mg total) by mouth daily.  Medical History:  Past Medical History:  Diagnosis Date   Chronic back pain    Chronic sinusitis    nasal polyps, turbinate hypertrophy, deviated septum   Erectile dysfunction    Goiter    Herpes simplex    Hypercholesteremia     Hypertension    Perirectal abscess    Skin abscess    Thyroid  disease  Tobacco dependence    Allergies:  Allergies  Allergen Reactions   Pork-Derived Products     No pork products; pt is a Muslim     Surgical History:  He  has a past surgical history that includes Neck surgery; Arm hardware removal (Right); Thyroidectomy, partial (2008); Parotidectomy; Incision and drainage perirectal abscess (N/A, 07/22/2017); Nasal septoplasty w/ turbinoplasty (Bilateral, 11/13/2019); Sinus endo with fusion (Bilateral, 11/13/2019); and LEFT HEART CATH AND CORONARY ANGIOGRAPHY (N/A, 12/05/2022). Family History:  His family history includes Stroke in his father and mother.  REVIEW OF SYSTEMS  : All other systems reviewed and negative except where noted in the History of Present Illness.  PHYSICAL EXAM: BP 122/80   Pulse 64   Ht 6' (1.829 m)   Wt 199 lb (90.3 kg)   BMI 26.99 kg/m  Physical Exam   GENERAL APPEARANCE: Well nourished, in no apparent distress. HEENT: No cervical lymphadenopathy, unremarkable thyroid , sclerae anicteric, conjunctiva pink. RESPIRATORY: Respiratory effort normal, breath sounds equal bilaterally without rales, rhonchi, or wheezing. CARDIO: Regular rate and rhythm with no murmurs, rubs, or gallops, peripheral pulses intact. ABDOMEN: Soft, non-distended, active bowel sounds in all four quadrants, no tenderness to palpation, no rebound, no mass appreciated. RECTAL: Left perirectal abscess with induration, no fluctuance or active drainage. Rectal exam difficult, possible stricture or tightness. No tenderness on palpation of anus. MUSCULOSKELETAL: Full range of motion, normal gait, without edema. SKIN: Dry, intact without rashes or lesions. No jaundice. NEURO: Alert, oriented, no focal deficits. PSYCH: Cooperative, normal mood and affect.      Edmonia Gottron, PA-C 11:47 AM

## 2023-08-13 ENCOUNTER — Ambulatory Visit (INDEPENDENT_AMBULATORY_CARE_PROVIDER_SITE_OTHER): Admitting: Physician Assistant

## 2023-08-13 ENCOUNTER — Encounter: Payer: Self-pay | Admitting: Physician Assistant

## 2023-08-13 ENCOUNTER — Other Ambulatory Visit: Payer: Self-pay

## 2023-08-13 ENCOUNTER — Telehealth: Payer: Self-pay

## 2023-08-13 VITALS — BP 122/80 | HR 64 | Ht 72.0 in | Wt 199.0 lb

## 2023-08-13 DIAGNOSIS — K219 Gastro-esophageal reflux disease without esophagitis: Secondary | ICD-10-CM

## 2023-08-13 DIAGNOSIS — D509 Iron deficiency anemia, unspecified: Secondary | ICD-10-CM

## 2023-08-13 DIAGNOSIS — Z872 Personal history of diseases of the skin and subcutaneous tissue: Secondary | ICD-10-CM

## 2023-08-13 DIAGNOSIS — I214 Non-ST elevation (NSTEMI) myocardial infarction: Secondary | ICD-10-CM

## 2023-08-13 DIAGNOSIS — R361 Hematospermia: Secondary | ICD-10-CM | POA: Diagnosis not present

## 2023-08-13 DIAGNOSIS — R6889 Other general symptoms and signs: Secondary | ICD-10-CM

## 2023-08-13 DIAGNOSIS — D649 Anemia, unspecified: Secondary | ICD-10-CM

## 2023-08-13 MED ORDER — NA SULFATE-K SULFATE-MG SULF 17.5-3.13-1.6 GM/177ML PO SOLN
1.0000 | Freq: Once | ORAL | 0 refills | Status: AC
  Filled 2023-08-13: qty 354, 2d supply, fill #0

## 2023-08-13 NOTE — Patient Instructions (Addendum)
 We have sent the following medications to your pharmacy for you to pick up at your convenience: suprep  You will be contacted by our office prior to your procedure for directions on holding your Plavix .  If you do not hear from our office 1 week prior to your scheduled procedure, please call [phone] to discuss.   You have been scheduled for a CT scan of the abdomen and pelvis at [facility], 1st floor Radiology. You are scheduled on 08/16/23 at 6 pm. You should arrive at 3:45 pm for registration and to start drinking contrast.    Please follow the written instructions below on the day of your exam:   1) Do not eat anything after 2 pm (4 hours prior to your test).  You may take any medications as prescribed with a small amount of water, if necessary. If you take any of the following medications: METFORMIN, GLUCOPHAGE, GLUCOVANCE, AVANDAMET, RIOMET, FORTAMET, ACTOPLUS MET, JANUMET, GLUMETZA or METAGLIP, you MAY be asked to HOLD this medication 48 hours AFTER the exam.   The purpose of you drinking the oral contrast is to aid in the visualization of your intestinal tract. The contrast solution may cause some diarrhea. Depending on your individual set of symptoms, you may also receive an intravenous injection of x-ray contrast/dye. Plan on being at [facility] for 45 minutes or longer, depending on the type of exam you are having performed.   If you have any questions regarding your exam or if you need to reschedule, you may call [facility] at [phone] between the hours of 8:00 am and 5:00 pm, Monday-Friday.     Your provider has requested that you go to the basement level for lab work before leaving today. Press "B" on the elevator. The lab is located at the first door on the left as you exit the elevator.  Miralax is an osmotic laxative.  It only brings more water into the stool.  This is safe to take daily.  Can take up to 17 gram of miralax twice a day.  Mix with  juice or coffee.  Start 1 capful at night for 3-4 days and reassess your response in 3-4 days.  You can increase and decrease the dose based on your response.  Remember, it can take up to 3-4 days to take effect OR for the effects to wear off.   I often pair this with benefiber in the morning to help assure the stool is not too loose.   Please take this medication 30 minutes to 1 hour before meals- this makes it more effective.  Avoid spicy and acidic foods Avoid fatty foods Limit your intake of coffee, tea, alcohol, and carbonated drinks Work to maintain a healthy weight Keep the head of the bed elevated at least 3 inches with blocks or a wedge pillow if you are having any nighttime symptoms Stay upright for 2 hours after eating Avoid meals and snacks three to four hours before bedtime

## 2023-08-13 NOTE — Telephone Encounter (Signed)
 Spoke with patient and scheduled TELE Preop appt for 08/21/23  med rec and consent done.

## 2023-08-13 NOTE — Telephone Encounter (Signed)
 med rec and consent done    Patient Consent for Virtual Visit        [name] has provided verbal consent on 08/13/2023 for a virtual visit (video or telephone).   CONSENT FOR VIRTUAL VISIT FOR:  Corey Hale  By participating in this virtual visit I agree to the following:  I hereby voluntarily request, consent and authorize [facility] and its employed or contracted physicians, physician assistants, nurse practitioners or other licensed health care professionals (the Practitioner), to provide me with telemedicine health care services (the "Services") as deemed necessary by the treating Practitioner. I acknowledge and consent to receive the Services by the Practitioner via telemedicine. I understand that the telemedicine visit will involve communicating with the Practitioner through live audiovisual communication technology and the disclosure of certain medical information by electronic transmission. I acknowledge that I have been given the opportunity to request an in-person assessment or other available alternative prior to the telemedicine visit and am voluntarily participating in the telemedicine visit.  I understand that I have the right to withhold or withdraw my consent to the use of telemedicine in the course of my care at any time, without affecting my right to future care or treatment, and that the Practitioner or I may terminate the telemedicine visit at any time. I understand that I have the right to inspect all information obtained and/or recorded in the course of the telemedicine visit and may receive copies of available information for a reasonable fee.  I understand that some of the potential risks of receiving the Services via telemedicine include:  Delay or interruption in medical evaluation due to technological equipment failure or disruption; Information transmitted may not be sufficient (e.g. poor resolution of images) to allow for appropriate medical decision  making by the Practitioner; and/or  In rare instances, security protocols could fail, causing a breach of personal health information.  Furthermore, I acknowledge that it is my responsibility to provide information about my medical history, conditions and care that is complete and accurate to the best of my ability. I acknowledge that Practitioner's advice, recommendations, and/or decision may be based on factors not within their control, such as incomplete or inaccurate data provided by me or distortions of diagnostic images or specimens that may result from electronic transmissions. I understand that the practice of medicine is not an exact science and that Practitioner makes no warranties or guarantees regarding treatment outcomes. I acknowledge that a copy of this consent can be made available to me via my patient portal [facility] MyChart), or I can request a printed copy by calling the office of [facility].    I understand that my insurance will be billed for this visit.   I have read or had this consent read to me. I understand the contents of this consent, which adequately explains the benefits and risks of the Services being provided via telemedicine.  I have been provided ample opportunity to ask questions regarding this consent and the Services and have had my questions answered to my satisfaction. I give my informed consent for the services to be provided through the use of telemedicine in my medical care

## 2023-08-13 NOTE — Telephone Encounter (Signed)
   Name: Corey Hale  DOB: 1965-07-16  MRN: [id number]  Primary Cardiologist: Antoinette Batman, MD   Preoperative team, please contact this patient and set up a phone call appointment for further preoperative risk assessment. Please obtain consent and complete medication review. Thank you for your help.  I confirm that guidance regarding antiplatelet and oral anticoagulation therapy has been completed and, if necessary, noted below.  Per protocol patient can hold Plavix  5 days prior to procedure and should restart postprocedure when surgically safe and hemostasis is achieved.    We would recommend patient continue aspirin  81 mg in the perioperative period however if bleeding risk is elevated he may discontinue 5 to 7 days prior to procedure.  I also confirmed the patient resides in the state of [place] . As per [facility] [facility] [employer] telemedicine laws, the patient must reside in the state in which the provider is licensed.   Francene Ing, Retha Cast, NP 08/13/2023, 2:50 PM [facility]

## 2023-08-13 NOTE — Telephone Encounter (Signed)
 [facility] Pre-operative Risk Assessment     Request for surgical clearance:     Endoscopy Procedure  What type of surgery is being performed?     Colonoscopy  When is this surgery scheduled?     09/12/23  What type of clearance is required ?   Pharmacy and Cardiac  Are there any medications that need to be held prior to surgery and how long? Plavix , 5 days  Practice name and name of physician performing surgery?      [facility]  What is your office phone and fax number?      Phone- [phone]  Fax- [phone]  Anesthesia type (None, local, MAC, general) ?       MAC   Please route your response to [place], [facility]

## 2023-08-14 ENCOUNTER — Other Ambulatory Visit (HOSPITAL_COMMUNITY): Payer: Self-pay

## 2023-08-14 ENCOUNTER — Other Ambulatory Visit: Payer: Self-pay

## 2023-08-14 ENCOUNTER — Ambulatory Visit: Admitting: Urology

## 2023-08-16 ENCOUNTER — Ambulatory Visit (HOSPITAL_COMMUNITY): Admission: RE | Admit: 2023-08-16 | Source: Ambulatory Visit

## 2023-08-18 ENCOUNTER — Other Ambulatory Visit: Payer: Self-pay | Admitting: Nurse Practitioner

## 2023-08-18 DIAGNOSIS — B001 Herpesviral vesicular dermatitis: Secondary | ICD-10-CM

## 2023-08-18 NOTE — Progress Notes (Incomplete)
 Chief Complaint: No chief complaint on file.   History of Present Illness:  Corey Hale is a 58 y.o. male who is seen in consultation from Collins Dean, NP for evaluation of hematospermia.   Past Medical History:  Past Medical History:  Diagnosis Date   Chronic back pain    Chronic sinusitis    nasal polyps, turbinate hypertrophy, deviated septum   Erectile dysfunction    Goiter    Herpes simplex    Hypercholesteremia    Hypertension    Perirectal abscess    Skin abscess    Thyroid  disease    Tobacco dependence     Past Surgical History:  Past Surgical History:  Procedure Laterality Date   ARM HARDWARE REMOVAL Right    INCISION AND DRAINAGE PERIRECTAL ABSCESS N/A 07/22/2017   Procedure: IRRIGATION, DEBRIDEMENT AND DRAINAGE PERIRECTAL ABSCESS;  Surgeon: Aldean Hummingbird, MD;  Location: [facility] OR;  Service: General;  Laterality: N/A;   LEFT HEART CATH AND CORONARY ANGIOGRAPHY N/A 12/05/2022   Procedure: LEFT HEART CATH AND CORONARY ANGIOGRAPHY;  Surgeon: Wenona Hamilton, MD;  Location: [facility] INVASIVE CV LAB;  Service: Cardiovascular;  Laterality: N/A;   NASAL SEPTOPLASTY W/ TURBINOPLASTY Bilateral 11/13/2019   Procedure: NASAL SEPTOPLASTY WITH TURBINATE REDUCTION;  Surgeon: Virgina Grills, MD;  Location: [facility] OR;  Service: ENT;  Laterality: Bilateral;   NECK SURGERY     PAROTIDECTOMY     Dr Tellis Feathers   SINUS ENDO WITH FUSION Bilateral 11/13/2019   Procedure: SINUS ENDO WITH FUSION;  Surgeon: Virgina Grills, MD;  Location: [facility] OR;  Service: ENT;  Laterality: Bilateral;   THYROIDECTOMY, PARTIAL  2008   Dr Alethea Andes    Allergies:  Allergies  Allergen Reactions   Pork-Derived Products     No pork products; pt is a Muslim    Family History:  Family History  Problem Relation Age of Onset   Stroke Mother    Stroke Father    Colon cancer Neg Hx    Stomach cancer Neg Hx    Esophageal cancer Neg Hx     Social History:  Social History   Tobacco Use   Smoking status: Former     Current packs/day: 3.00    Average packs/day: 3.0 packs/day for 22.0 years (66.0 ttl pk-yrs)    Types: Cigarettes   Smokeless tobacco: Never   Tobacco comments:    3 to 4 cigs a day  Vaping Use   Vaping status: Never Used  Substance Use Topics   Alcohol use: No    Alcohol/week: 0.0 standard drinks of alcohol   Drug use: No    Review of symptoms:  Constitutional:  Negative for unexplained weight loss, night sweats, fever, chills ENT:  Negative for nose bleeds, sinus pain, painful swallowing CV:  Negative for chest pain, shortness of breath, exercise intolerance, palpitations, loss of consciousness Resp:  Negative for cough, wheezing, shortness of breath GI:  Negative for nausea, vomiting, diarrhea, bloody stools GU:  Positives noted in HPI; otherwise negative for gross hematuria, dysuria, urinary incontinence Neuro:  Negative for seizures, poor balance, limb weakness, slurred speech Psych:  Negative for lack of energy, depression, anxiety Endocrine:  Negative for polydipsia, polyuria, symptoms of hypoglycemia (dizziness, hunger, sweating) Hematologic:  Negative for anemia, purpura, petechia, prolonged or excessive bleeding, use of anticoagulants  Allergic:  Negative for difficulty breathing or choking as a result of exposure to anything; no shellfish allergy; no allergic response (rash/itch) to materials, foods  Physical exam: There  were no vitals taken for this visit. GENERAL APPEARANCE:  Well appearing, well developed, well nourished, NAD HEENT: Atraumatic, Normocephalic. NECK: Normal appearance LUNGS: Normal inspiratory and expiratory excursion HEART: Regular Rate ABDOMEN: ***. GU: Phallus normal, no lesions. Scrotal skin normal. Testicles/epididymal structures normal. Meatus normal. Normal anal sphincter tone, prostate ***mL, symmetric, non nodular, non tender. EXTREMITIES: Moves all extremities well.  Without clubbing, cyanosis, or edema. NEUROLOGIC:  Alert and oriented x 3,  normal gait, CN II-XII grossly intact.  MENTAL STATUS:  Appropriate. SKIN:  Warm, dry and intact.    Results: No results found for this or any previous visit (from the past 24 hours).  I have reviewed referring/prior physicians notes  I have reviewed urinalysis  I have reviewed PSA results  I have reviewed prior imaging  I have reviewed urine culture results  Assessment: ***   Plan: ***

## 2023-08-19 ENCOUNTER — Ambulatory Visit: Admitting: Urology

## 2023-08-19 DIAGNOSIS — R361 Hematospermia: Secondary | ICD-10-CM

## 2023-08-20 ENCOUNTER — Ambulatory Visit: Admitting: Physical Therapy

## 2023-08-20 ENCOUNTER — Other Ambulatory Visit: Payer: Self-pay

## 2023-08-20 MED ORDER — VALACYCLOVIR HCL 1 G PO TABS
1000.0000 mg | ORAL_TABLET | Freq: Every day | ORAL | 3 refills | Status: AC
Start: 2023-08-20 — End: ?
  Filled 2023-08-20: qty 30, 30d supply, fill #0
  Filled 2023-10-02 – 2023-10-14 (×2): qty 30, 30d supply, fill #1
  Filled 2023-11-25: qty 30, 30d supply, fill #2
  Filled 2024-01-07: qty 30, 30d supply, fill #3
  Filled 2024-02-17: qty 30, 30d supply, fill #4
  Filled 2024-03-24: qty 30, 30d supply, fill #5

## 2023-08-21 ENCOUNTER — Other Ambulatory Visit: Payer: Self-pay

## 2023-08-21 ENCOUNTER — Ambulatory Visit: Attending: Cardiology

## 2023-08-21 DIAGNOSIS — Z01810 Encounter for preprocedural cardiovascular examination: Secondary | ICD-10-CM | POA: Diagnosis not present

## 2023-08-21 NOTE — Progress Notes (Signed)
 Virtual Visit via Telephone Note   Because of [name] co-morbid illnesses, he is at least at moderate risk for complications without adequate follow up.  This format is felt to be most appropriate for this patient at this time.  Due to technical limitations with video connection (technology), today's appointment will be conducted as an audio only telehealth visit, and Corey Hale verbally agreed to proceed in this manner.   All issues noted in this document were discussed and addressed.  No physical exam could be performed with this format.  Evaluation Performed:  Preoperative cardiovascular risk assessment _____________   Date:  08/21/2023   Patient ID:  Corey Hale, DOB 19-Jan-1966, MRN [id number] Patient Location:  Home Provider location:   Office  Primary Care Provider:  Collins Dean, NP Primary Cardiologist:  Antoinette Batman, MD  Chief Complaint / Patient Profile  58 y.o. y/o male with a h/o NSTEMI s/p LHC with occluded mid to distal RCA with left-to-right collaterals and no PCI performed due to late presentation of MI, hyperlipidemia, hypertension, tobacco use and GERD who is pending colonoscopy and presents today for telephonic preoperative cardiovascular risk assessment. History of Present Illness   Corey Hale is a 58 y.o. male who presents via audio/video conferencing for a telehealth visit today.  Pt was last seen in cardiology clinic on 04/15/2023 by [name], NP.  At that time [name] was doing well.  The patient is now pending procedure as outlined above. Since his last visit, he has remained stable from a cardiac standpoint.  He is able to achieve greater than 4 METS of activity. Today he denies chest pain, shortness of breath, lower extremity edema, fatigue, palpitations, melena, hematuria, hemoptysis, diaphoresis, weakness, presyncope, syncope, orthopnea, and PND.  Past Medical History    Past Medical History:  Diagnosis Date   Chronic back pain     Chronic sinusitis    nasal polyps, turbinate hypertrophy, deviated septum   Erectile dysfunction    Goiter    Herpes simplex    Hypercholesteremia    Hypertension    Perirectal abscess    Skin abscess    Thyroid  disease    Tobacco dependence    Past Surgical History:  Procedure Laterality Date   ARM HARDWARE REMOVAL Right    INCISION AND DRAINAGE PERIRECTAL ABSCESS N/A 07/22/2017   Procedure: IRRIGATION, DEBRIDEMENT AND DRAINAGE PERIRECTAL ABSCESS;  Surgeon: Aldean Hummingbird, MD;  Location: [facility] OR;  Service: General;  Laterality: N/A;   LEFT HEART CATH AND CORONARY ANGIOGRAPHY N/A 12/05/2022   Procedure: LEFT HEART CATH AND CORONARY ANGIOGRAPHY;  Surgeon: Wenona Hamilton, MD;  [facility]: [facility];  Service: Cardiovascular;  Laterality: N/A;   NASAL SEPTOPLASTY W/ TURBINOPLASTY Bilateral 11/13/2019   Procedure: NASAL SEPTOPLASTY WITH TURBINATE REDUCTION;  Surgeon: Virgina Grills, MD;  Location: [facility] OR;  Service: ENT;  Laterality: Bilateral;   NECK SURGERY     PAROTIDECTOMY     Dr Tellis Feathers   SINUS ENDO WITH FUSION Bilateral 11/13/2019   Procedure: SINUS ENDO WITH FUSION;  Surgeon: Virgina Grills, MD;  Location: [facility] OR;  Service: ENT;  Laterality: Bilateral;   THYROIDECTOMY, PARTIAL  2008   Dr Alethea Andes    Allergies Allergies  Allergen Reactions   Pork-Derived Products     No pork products; pt is a Muslim   Home Medications    Prior to Admission medications   Medication Sig Start Date End Date Taking? Authorizing Provider  aspirin   81 MG tablet Take 1 tablet (81 mg total) by mouth daily. 03/07/15   Jegede, Olugbemiga E, MD  atorvastatin  (LIPITOR) 40 MG tablet Take 1 tablet (40 mg total) by mouth daily. 04/15/23   Fleming, Zelda W, NP  clopidogrel  (PLAVIX ) 75 MG tablet Take 1 tablet (75 mg total) by mouth daily with breakfast. 12/07/22   Johnie Nailer B, NP  colchicine  0.6 MG tablet Take 1 tablet (0.6 mg total) by mouth 2 (two) times daily. 02/20/23   Sanjuanita Cruz, NP   famotidine  (PEPCID ) 40 MG tablet Take 1 tablet (40 mg total) by mouth daily. For acid reflux 07/29/23   Fleming, Zelda W, NP  gabapentin  (NEURONTIN ) 600 MG tablet Take 0.5-1 tablets (300-600 mg total) by mouth 3 (three) times daily. 07/16/23   Collins Dean, NP  metoprolol  succinate (TOPROL  XL) 25 MG 24 hr tablet Take 1 tablet (25 mg total) by mouth daily. 04/15/23   Gerald Kitty., NP  Na Sulfate-K Sulfate-Mg Sulfate concentrate (SUPREP) 17.5-3.13-1.6 GM/177ML SOLN Take 1 kit (354 mLs total) by mouth once for 1 dose. 08/13/23 08/22/23  Edmonia Gottron, PA-C  naloxone  (NARCAN ) nasal spray 4 mg/0.1 mL Use 1 (one) spray as needed. 07/01/23     nitroGLYCERIN  (NITROSTAT ) 0.4 MG SL tablet Place 1 tablet (0.4 mg total) under the tongue every 5 (five) minutes as needed. 12/06/22   Sanjuanita Cruz, NP  Oxycodone  HCl 20 MG TABS Take 1 Tablet by mouth four times daily, as needed 12/12/22     Oxycodone  HCl 20 MG TABS Take 1 Tablet by mouth 4 times daily, as needed 12/18/22     Oxycodone  HCl 20 MG TABS Take 1 Tablet by mouth four times daily, as needed 01/14/23     Oxycodone  HCl 20 MG TABS Take 1 tablet (20 mg total) by mouth 4 (four) times daily as needed. 08/01/23     traZODone  (DESYREL ) 100 MG tablet Take 1 tablet (100 mg total) by mouth at bedtime. FOR SLEEP 04/15/23   Fleming, Zelda W, NP  valACYclovir  (VALTREX ) 1000 MG tablet Take 1 tablet (1,000 mg total) by mouth daily. 08/20/23   Newlin, Enobong, MD    Physical Exam    Vital Signs:  [name] does not have vital signs available for review today.  Given telephonic nature of communication, physical exam is limited. AAOx3. NAD. Normal affect.  Speech and respirations are unlabored.  Accessory Clinical Findings  None Assessment & Plan    1.  Preoperative Cardiovascular Risk Assessment: [name] perioperative risk of a major cardiac event is 0.9% according to the Revised Cardiac Risk Index (RCRI).  Therefore, he is at low risk for  perioperative complications.   His functional capacity is excellent at 8.97 METs according to the Duke Activity Status Index (DASI). Recommendations: According to ACC/AHA guidelines, no further cardiovascular testing needed.  The patient may proceed to surgery at acceptable risk.   Antiplatelet and/or Anticoagulation Recommendations: Per protocol patient can hold Plavix  5 days prior to procedure and should restart postprocedure when surgically safe and hemostasis is achieved.  We would recommend patient continue aspirin  81 mg in the perioperative period however if bleeding risk is elevated he may discontinue 5 to 7 days prior to procedure.  The patient was advised that if he develops new symptoms prior to surgery to contact our office to arrange for a follow-up visit, and he verbalized understanding.  A copy of this note will be routed to requesting surgeon.  Time:   Today, I have spent 12 minutes with the patient with telehealth technology discussing medical history, symptoms, and management plan.     Jaisa Defino D Nazariah Cadet, NP  08/21/2023, 3:16 PM

## 2023-08-22 NOTE — Telephone Encounter (Signed)
 LVM in regards to medical clearance for colonoscopy 09/12/23. Pharmacy and cardiology clearance received. Pt informed via VM to hold Plavix  5 days prior to procedure and to call our office to let us  know he has received the VM and states understanding.  Electronically signed:  Londell River, NCMA

## 2023-08-26 NOTE — Telephone Encounter (Signed)
 Clinical reminder set to call pt on 6/20 as the 21st is a Saturday.

## 2023-08-26 NOTE — Telephone Encounter (Signed)
 Pt informed but requesting a call back on 6/21 as another reminder to hold his Plavix .

## 2023-08-27 ENCOUNTER — Encounter: Payer: Self-pay | Admitting: Physical Therapy

## 2023-08-27 ENCOUNTER — Ambulatory Visit: Attending: Nurse Practitioner | Admitting: Physical Therapy

## 2023-08-27 DIAGNOSIS — M25622 Stiffness of left elbow, not elsewhere classified: Secondary | ICD-10-CM | POA: Insufficient documentation

## 2023-08-27 DIAGNOSIS — R293 Abnormal posture: Secondary | ICD-10-CM | POA: Insufficient documentation

## 2023-08-27 NOTE — Therapy (Addendum)
 OUTPATIENT PHYSICAL THERAPY TREATMENT   Patient Name: Corey Hale MRN: [id number] DOB:1966-01-11, 58 y.o., male Today's Date: 08/27/2023  END OF SESSION:  PT End of Session - 08/27/23 1626     Visit Number 2    Date for PT Re-Evaluation 09/30/23    Authorization Type [employer] Medicaid    PT Start Time 1626    PT Stop Time 1708    PT Time Calculation (min) 42 min    Activity Tolerance Patient tolerated treatment well    Behavior During Therapy WFL for tasks assessed/performed              Past Medical History:  Diagnosis Date   Chronic back pain    Chronic sinusitis    nasal polyps, turbinate hypertrophy, deviated septum   Erectile dysfunction    Goiter    Herpes simplex    Hypercholesteremia    Hypertension    Perirectal abscess    Skin abscess    Thyroid  disease    Tobacco dependence    Past Surgical History:  Procedure Laterality Date   ARM HARDWARE REMOVAL Right    INCISION AND DRAINAGE PERIRECTAL ABSCESS N/A 07/22/2017   Procedure: IRRIGATION, DEBRIDEMENT AND DRAINAGE PERIRECTAL ABSCESS;  Surgeon: Tanda Locus, MD;  Location: [facility] OR;  Service: General;  Laterality: N/A;   LEFT HEART CATH AND CORONARY ANGIOGRAPHY N/A 12/05/2022   Procedure: LEFT HEART CATH AND CORONARY ANGIOGRAPHY;  Surgeon: Darron Deatrice LABOR, MD;  Location: [facility];  Service: Cardiovascular;  Laterality: N/A;   NASAL SEPTOPLASTY W/ TURBINOPLASTY Bilateral 11/13/2019   Procedure: NASAL SEPTOPLASTY WITH TURBINATE REDUCTION;  Surgeon: Carlie Clark, MD;  Location: [facility] OR;  Service: ENT;  Laterality: Bilateral;   NECK SURGERY     PAROTIDECTOMY     Dr Carlie   SINUS ENDO WITH FUSION Bilateral 11/13/2019   Procedure: SINUS ENDO WITH FUSION;  Surgeon: Carlie Clark, MD;  Location: [facility] OR;  Service: ENT;  Laterality: Bilateral;   THYROIDECTOMY, PARTIAL  2008   Dr Curvin   Patient Active Problem List   Diagnosis Date Noted   NSTEMI (non-ST elevated myocardial infarction) (HCC) 12/04/2022    Prediabetes 09/26/2021   Bilateral impacted cerumen 09/26/2021   Subjective tinnitus of both ears 07/24/2019   Bilateral sensorineural hearing loss 01/01/2019   Hyperlipidemia 07/21/2017   History of abscess of skin and subcutaneous tissue 07/21/2017   Mid back pain, chronic 05/26/2015   Chronic midline low back pain without sciatica 05/26/2015   Midline thoracic back pain 03/07/2015   History of vertebral fracture 05/20/2014   Essential hypertension 02/23/2014   Lipoma of face 09/07/2013   Hearing loss 09/07/2013   Dental caries 06/10/2012   Erectile dysfunction 06/10/2012   Tobacco abuse 06/10/2012   Personal history of goiter 06/10/2012   Genital herpes 03/05/2012   Thoracic back pain 07/21/2009   THYROID  NODULE, HX OF 07/21/2009    PCP: Theotis Haze ORN, NP   REFERRING PROVIDER: Theotis Haze ORN, NP   REFERRING DIAG: [phone] (ICD-10-CM) - Contracture of muscle of left forearm   THERAPY DIAG:  Stiffness of left elbow, not elsewhere classified  Abnormal posture  RATIONALE FOR EVALUATION AND TREATMENT: Rehabilitation  ONSET DATE: ~2-3 yrs  NEXT MD VISIT: 01/15/24   SUBJECTIVE:  SUBJECTIVE STATEMENT: Pt denies pain today but notes he has had some pain in the R elbow recently.  He has only tried the HEP 2 or 3 days.  EVAL:  Pt reports his L elbow has been unable full straighten for 2-3 years.  He cannot recall any MOI, but states he does a lot of driving and thinks it may related to his arm position while driving.  No pain normally but did have some pain a few days ago in proximal wrist flexor group/medial epicondyle after carrying something heavy but now resolved.  He denies any awareness of weakness.   PAIN: Are you having pain? No  PERTINENT HISTORY:  Chronic thoracic  and low back pain, h/o vertebral fracture, neck surgery, HTN, NSTEMI, prediabetes, HOH/hearing loss - getting hearing aids in August   PRECAUTIONS: None  HAND DOMINANCE: Right  RED FLAGS: None  WEIGHT BEARING RESTRICTIONS: No  FALLS:  Has patient fallen in last 6 months? 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  PLOF: Independent and Leisure: mostly sedentary  PATIENT GOALS: To get my elbow straight   OBJECTIVE: (objective measures completed at initial evaluation unless otherwise dated)  DIAGNOSTIC FINDINGS:  DG of L humerus and shoulder ordered for 08/16/23  PATIENT SURVEYS:  Quick Dash 0.0 / 100 = 0.0 %  COGNITION: Overall cognitive status: Within functional limits for tasks assessed     SENSATION: WFL  POSTURE: Mild forward head and rounded shoulders  UPPER EXTREMITY ROM:   Active ROM Right eval Left eval  Shoulder flexion    Shoulder extension    Shoulder abduction    Shoulder adduction    Shoulder internal rotation    Shoulder external rotation    Elbow flexion 140 126  Elbow extension 0 19  Wrist flexion    Wrist extension    Wrist ulnar deviation    Wrist radial deviation    Wrist pronation 95 78  Wrist supination 98 84  (Blank rows = not tested)  UPPER EXTREMITY MMT: B shoulders. Elbows, forearms and wrists 5/5 MMT Right eval Left eval  Grip strength (lbs) 65 # 73 #  (Blank rows = not tested)  JOINT MOBILITY TESTING:  Limited joint mobility at ulnohumeral joint, radiocapitellar joint and proximal radioulnar joint  PALPATION:  TTP over L wrist flexor group and supinator but not medial epicondyle. Increased muscle tension in L wrist extensors but not TTP. TTP in L biceps.    TODAY'S TREATMENT:   08/27/2023  THERAPEUTIC EXERCISE: To improve ROM and flexibility.  Demonstration, verbal and tactile cues throughout for  technique.  UBE: L1.0 x 6 min (3' each fwd & back) L wrist extension stretch with elbow straight and forearm supinated 2 x 30 L wrist flexion stretch with elbow straight and forearm pronated 2 x 30 L anterior shoulder and biceps stretch in sitting with arm extended behind body x 30 L bicep stretch at table x 30 Doorway pec stretch at 60 degrees abduction with arm straight 2 x 30 Hookying pec stretch over pool noodle at 90 ABD x 60 Seated forearm pronation and supination with hammer 10 x 5  MANUAL THERAPY: To promote normalized muscle tension, improved flexibility, improved joint mobility, and increased ROM utilizing joint mobilization, connective tissue massage, therapeutic massage, and manual TP therapy.  STM/DTM to L biceps, brachialis, brachioradialis, and wrist flexor/extensor groups to reduce muscle tension and promote increased elbow  extension L elbow humeroulnar distraction and elbow humeroradial posterior glide to increase elbow extension  NEUROMUSCULAR RE-EDUCATION: To improve coordination, kinesthesia, and posture. Hooklying over pool noodle - scap retraction + horizontal ABD 10 x 5, 2 sets Standing RTB scap retraction + straight arm shoulder extension 10 x 5, 2 sets Standing RTB triceps extension 10 x 5, 2 sets   08/05/2023  SELF CARE:  Reviewed eval findings and role of PT in addressing identified deficits as well as instruction in initial HEP (see below).   PATIENT EDUCATION:  Education details: HEP review and HEP progression  Person educated: Patient Education method: Explanation, Demonstration, Tactile cues, Verbal cues, and Handouts Education comprehension: verbalized understanding, returned demonstration, verbal cues required, tactile cues required, and needs further education  HOME EXERCISE PROGRAM: Access Code: [email or web address] URL: [email or web address] Date: 08/27/2023 Prepared by: Elijah Hidden  Exercises - Seated Wrist Extension Stretch  -  1-2 x daily - 7 x weekly - 3 reps - 30 sec hold - Seated Wrist Flexion Stretch  - 1-2 x daily - 7 x weekly - 3 reps - 30 sec hold - Anterior Shoulder and Biceps Stretch  - 1-2 x daily - 7 x weekly - 3 reps - 30 sec hold - Bicep Stretch at Table  - 1-2 x daily - 7 x weekly - 3 reps - 30 sec hold - Doorway Pec Stretch at 60 Degrees Abduction with Arm Straight (Mirrored)  - 1-2 x daily - 7 x weekly - 3 reps - 30 sec hold - Forearm Pronation and Supination with Hammer  - 1 x daily - 7 x weekly - 2 sets - 10 reps - 5 sec hold - Supine Shoulder Horizontal Abduction with Resistance  - 1 x daily - 3-4 x weekly - 2 sets - 10 reps - 3 sec hold - Scapular Retraction with Resistance Advanced  - 1 x daily - 3-4 x weekly - 2 sets - 10 reps - 5 sec hold - Standing Elbow Extension with Anchored Resistance  - 1 x daily - 3-4 x weekly - 2 sets - 10 reps - 3 sec hold   ASSESSMENT:  CLINICAL IMPRESSION: Yida reports limited performance of his HEP therefore HEP reviewed clarifying hold times for stretches as well as proper alignment.  MT focusing on STM and joint mobilization to improve L elbow extension followed by scapular and triceps strengthening to promote improved alignment and increased extension.  All activities/exercises well tolerated with HEP updated to reflect strengthening progression.  Nikitas will benefit from continued skilled PT to address ongoing ROM and strength deficits to improve mobility and activity tolerance.  OBJECTIVE IMPAIRMENTS: decreased activity tolerance, decreased knowledge of condition, decreased ROM, hypomobility, increased fascial restrictions, impaired perceived functional ability, increased muscle spasms, impaired flexibility, improper body mechanics, and postural dysfunction.   ACTIVITY LIMITATIONS: carrying and lifting  PARTICIPATION LIMITATIONS: driving and occupation  PERSONAL FACTORS: Past/current experiences, Profession, Time since onset of injury/illness/exacerbation,  and 3+ comorbidities: Chronic thoracic and low back pain, h/o vertebral fracture, neck surgery, HTN, NSTEMI, prediabetes, HOH/hearing loss - getting hearing aids in August  are also affecting patient's functional outcome.   REHAB POTENTIAL: Good  CLINICAL DECISION MAKING: Evolving/moderate complexity  EVALUATION COMPLEXITY: Moderate   GOALS: Goals reviewed with patient? Yes  SHORT TERM GOALS: Target date: 09/02/2023  Patient will be independent with initial HEP to improve outcomes and carryover.  Baseline: Initial HEP provided on eval Goal status: IN PROGRESS - 08/27/23 - HEP reviewed today  2.  Patient to improve L elbow extension AROM to <10 without pain provocation to allow for increased ease of ADLs.  Baseline: L elbow extension 19 Goal status: IN PROGRESS  LONG TERM GOALS: Target date: 09/30/2023  Patient will be independent with ongoing/advanced HEP for self-management at home.  Baseline:  Goal status: INITIAL  2.  Patient to demonstrate improved upright posture with posterior shoulder girdle engaged to promote improved functional UE use. Baseline:  Goal status: INITIAL  3.  Patient to improve L elbow flexion and extension AROM to [facility] without pain provocation to allow for increased ease of ADLs.  Baseline: L elbow 19-126 Goal status: INITIAL  4.  Patient to report ability to perform ADLs, household, and work-related tasks without limitation due to L elbow pain, LOM or weakness. Baseline:  Goal status: INITIAL   PLAN:  PT FREQUENCY: 1x/week  PT DURATION: 6-8 weeks  PLANNED INTERVENTIONS: 02835- PT Re-evaluation, 97750- Physical Performance Testing, 97110-Therapeutic exercises, 97530- Therapeutic activity, [id number]- Neuromuscular re-education, 97535- Self Care, 02859- Manual therapy, [id number]- Electrical stimulation (unattended), 97016- Vasopneumatic device, 97035- Ultrasound, 02966- Ionotophoresis 4mg /ml Dexamethasone , Patient/Family education, Taping, Dry Needling,  Joint mobilization, Cryotherapy, and Moist heat  PLAN FOR NEXT SESSION: MT +/- TPDN to address abnormal muscle tension in supinator, biceps and wrist flexor group; L elbow extension ROM and stretching/strengthening   Elijah CHRISTELLA Hidden, PT 08/27/2023, 6:22 PM

## 2023-08-29 ENCOUNTER — Other Ambulatory Visit (HOSPITAL_COMMUNITY): Payer: Self-pay

## 2023-08-30 ENCOUNTER — Other Ambulatory Visit (HOSPITAL_COMMUNITY): Payer: Self-pay

## 2023-08-30 DIAGNOSIS — M5030 Other cervical disc degeneration, unspecified cervical region: Secondary | ICD-10-CM | POA: Diagnosis not present

## 2023-08-30 DIAGNOSIS — Z79899 Other long term (current) drug therapy: Secondary | ICD-10-CM | POA: Diagnosis not present

## 2023-08-30 DIAGNOSIS — Z6826 Body mass index (BMI) 26.0-26.9, adult: Secondary | ICD-10-CM | POA: Diagnosis not present

## 2023-08-30 MED ORDER — OXYCODONE HCL 20 MG PO TABS
20.0000 mg | ORAL_TABLET | Freq: Four times a day (QID) | ORAL | 0 refills | Status: DC
  Filled 2023-08-30: qty 120, 30d supply, fill #0

## 2023-08-30 MED ORDER — OXYCODONE HCL 20 MG PO TABS
20.0000 mg | ORAL_TABLET | ORAL | 0 refills | Status: DC | PRN
  Filled 2023-08-30 – 2023-09-27 (×2): qty 180, 30d supply, fill #0

## 2023-09-02 ENCOUNTER — Other Ambulatory Visit: Payer: Self-pay

## 2023-09-02 ENCOUNTER — Telehealth: Payer: Self-pay | Admitting: Cardiovascular Disease

## 2023-09-02 MED ORDER — CLOPIDOGREL BISULFATE 75 MG PO TABS
75.0000 mg | ORAL_TABLET | Freq: Every day | ORAL | 2 refills | Status: DC
Start: 2023-09-02 — End: 2023-10-14
  Filled 2023-09-02: qty 90, 90d supply, fill #0

## 2023-09-02 NOTE — Telephone Encounter (Signed)
*  STAT* If patient is at the pharmacy, call can be transferred to refill team.   1. Which medications need to be refilled? (please list name of each medication and dose if known)   clopidogrel  (PLAVIX ) 75 MG tablet   2. Would you like to learn more about the convenience, safety, & potential cost savings by using the [facility]?   3. Are you open to using the [facility] (Type [facility]. ).  4. Which pharmacy/location (including street and city if local pharmacy) is medication to be sent to?  [facility]   5. Do they need a 30 day or 90 day supply?   90 day  Patient stated he is completely out of this medication.

## 2023-09-02 NOTE — Telephone Encounter (Signed)
 RX sent to requested Pharmacy

## 2023-09-03 ENCOUNTER — Encounter: Payer: Self-pay | Admitting: Physical Therapy

## 2023-09-03 ENCOUNTER — Ambulatory Visit: Admitting: Physical Therapy

## 2023-09-03 DIAGNOSIS — M25622 Stiffness of left elbow, not elsewhere classified: Secondary | ICD-10-CM

## 2023-09-03 DIAGNOSIS — R293 Abnormal posture: Secondary | ICD-10-CM

## 2023-09-03 NOTE — Therapy (Addendum)
 OUTPATIENT PHYSICAL THERAPY TREATMENT   Patient Name: Corey Hale MRN: [id number] DOB:[birth year], 58 y.o., male Today's Date: 09/03/2023  END OF SESSION:  PT End of Session - 09/03/23 1619     Visit Number 3    Date for PT Re-Evaluation 09/30/23    Authorization Type [employer] Medicaid    PT Start Time 1619    PT Stop Time 1704    PT Time Calculation (min) 45 min    Activity Tolerance Patient tolerated treatment well    Behavior During Therapy WFL for tasks assessed/performed            Past Medical History:  Diagnosis Date   Chronic back pain    Chronic sinusitis    nasal polyps, turbinate hypertrophy, deviated septum   Erectile dysfunction    Goiter    Herpes simplex    Hypercholesteremia    Hypertension    Perirectal abscess    Skin abscess    Thyroid  disease    Tobacco dependence    Past Surgical History:  Procedure Laterality Date   ARM HARDWARE REMOVAL Right    INCISION AND DRAINAGE PERIRECTAL ABSCESS N/A 07/22/2017   Procedure: IRRIGATION, DEBRIDEMENT AND DRAINAGE PERIRECTAL ABSCESS;  Surgeon: Tanda Locus, MD;  Location: [facility] OR;  Service: General;  Laterality: N/A;   LEFT HEART CATH AND CORONARY ANGIOGRAPHY N/A 12/05/2022   Procedure: LEFT HEART CATH AND CORONARY ANGIOGRAPHY;  Surgeon: Darron Deatrice LABOR, MD;  Location: [facility];  Service: Cardiovascular;  Laterality: N/A;   NASAL SEPTOPLASTY W/ TURBINOPLASTY Bilateral 11/13/2019   Procedure: NASAL SEPTOPLASTY WITH TURBINATE REDUCTION;  Surgeon: Carlie Clark, MD;  Location: [facility] OR;  Service: ENT;  Laterality: Bilateral;   NECK SURGERY     PAROTIDECTOMY     Dr Carlie   SINUS ENDO WITH FUSION Bilateral 11/13/2019   Procedure: SINUS ENDO WITH FUSION;  Surgeon: Carlie Clark, MD;  Location: [facility] OR;  Service: ENT;  Laterality: Bilateral;   THYROIDECTOMY, PARTIAL  2008   Dr Curvin   Patient Active Problem List   Diagnosis Date Noted   NSTEMI (non-ST elevated myocardial infarction) (HCC) 12/04/2022    Prediabetes 09/26/2021   Bilateral impacted cerumen 09/26/2021   Subjective tinnitus of both ears 07/24/2019   Bilateral sensorineural hearing loss 01/01/2019   Hyperlipidemia 07/21/2017   History of abscess of skin and subcutaneous tissue 07/21/2017   Mid back pain, chronic 05/26/2015   Chronic midline low back pain without sciatica 05/26/2015   Midline thoracic back pain 03/07/2015   History of vertebral fracture 05/20/2014   Essential hypertension 02/23/2014   Lipoma of face 09/07/2013   Hearing loss 09/07/2013   Dental caries 06/10/2012   Erectile dysfunction 06/10/2012   Tobacco abuse 06/10/2012   Personal history of goiter 06/10/2012   Genital herpes 03/05/2012   Thoracic back pain 07/21/2009   THYROID  NODULE, HX OF 07/21/2009    PCP: Theotis Haze ORN, NP   REFERRING PROVIDER: Theotis Haze ORN, NP   REFERRING DIAG: [phone] (ICD-10-CM) - Contracture of muscle of left forearm   THERAPY DIAG:  Stiffness of left elbow, not elsewhere classified  Abnormal posture  RATIONALE FOR EVALUATION AND TREATMENT: Rehabilitation  ONSET DATE: ~2-3 yrs  NEXT MD VISIT: 01/15/24   SUBJECTIVE:  SUBJECTIVE STATEMENT: Pt denies pain recently.  No issues with HEP.  EVAL:  Pt reports his L elbow has been unable full straighten for 2-3 years.  He cannot recall any MOI, but states he does a lot of driving and thinks it may related to his arm position while driving.  No pain normally but did have some pain a few days ago in proximal wrist flexor group/medial epicondyle after carrying something heavy but now resolved.  He denies any awareness of weakness.   PAIN: Are you having pain? No  PERTINENT HISTORY:  Chronic thoracic and low back pain, h/o vertebral fracture, neck surgery, HTN, NSTEMI,  prediabetes, HOH/hearing loss - getting hearing aids in August   PRECAUTIONS: None  HAND DOMINANCE: Right  RED FLAGS: None  WEIGHT BEARING RESTRICTIONS: No  FALLS:  Has patient fallen in last 6 months? 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  PLOF: Independent and Leisure: mostly sedentary  PATIENT GOALS: To get my elbow straight   OBJECTIVE: (objective measures completed at initial evaluation unless otherwise dated)  DIAGNOSTIC FINDINGS:  DG of L humerus and shoulder ordered for 08/16/23  PATIENT SURVEYS:  Quick Dash 0.0 / 100 = 0.0 %  COGNITION: Overall cognitive status: Within functional limits for tasks assessed     SENSATION: WFL  POSTURE: Mild forward head and rounded shoulders  UPPER EXTREMITY ROM:   Active ROM Right eval Left eval L 09/03/23  Shoulder flexion     Shoulder extension     Shoulder abduction     Shoulder adduction     Shoulder internal rotation     Shoulder external rotation     Elbow flexion 140 126 139  Elbow extension 0 19 14  Wrist flexion     Wrist extension     Wrist ulnar deviation     Wrist radial deviation     Wrist pronation 95 78 85  Wrist supination 98 84 92  (Blank rows = not tested)  UPPER EXTREMITY MMT: B shoulders. Elbows, forearms and wrists 5/5 MMT Right eval Left eval  Grip strength (lbs) 65 # 73 #  (Blank rows = not tested)  JOINT MOBILITY TESTING:  Limited joint mobility at ulnohumeral joint, radiocapitellar joint and proximal radioulnar joint  PALPATION:  TTP over L wrist flexor group and supinator but not medial epicondyle. Increased muscle tension in L wrist extensors but not TTP. TTP in L biceps.    TODAY'S TREATMENT:   09/03/2023 THERAPEUTIC EXERCISE: To improve strength, endurance, ROM, and flexibility.  Demonstration, verbal and tactile cues throughout for technique.  UBE: L3.0  x 6 min (3' each fwd & back) Supine gravity assisted L elbow extension LLLD stretch with 3# db 4 x 30, towel roll under upper arm  MANUAL THERAPY: To promote normalized muscle tension, improved flexibility, improved joint mobility, and increased ROM utilizing joint mobilization, connective tissue massage, therapeutic massage, and manual TP therapy.  STM/DTM and IASTM with edge tool to L biceps, brachialis, brachioradialis, and wrist flexor/extensor groups to reduce muscle tension and promote increased elbow extension L elbow humeroulnar distraction and elbow humeroradial posterior glide to increase elbow extension  NEUROMUSCULAR RE-EDUCATION: To improve coordination, kinesthesia, and posture. Prone L elbow extension 3# x 10 Prone L scap retraction + shoulder extension with elbow extension 3# x 10 Prone L scap retraction + shoulder horiz ABD with elbow extension 3# x 10  08/27/2023  THERAPEUTIC EXERCISE: To improve ROM and flexibility.  Demonstration, verbal and tactile cues throughout for technique.  UBE: L1.0 x 6 min (3' each fwd & back) L wrist extension stretch with elbow straight and forearm supinated 2 x 30 L wrist flexion stretch with elbow straight and forearm pronated 2 x 30 L anterior shoulder and biceps stretch in sitting with arm extended behind body x 30 L bicep stretch at table x 30 Doorway pec stretch at 60 degrees abduction with arm straight 2 x 30 Hookying pec stretch over pool noodle at 90 ABD x 60 Seated forearm pronation and supination with hammer 10 x 5  MANUAL THERAPY: To promote normalized muscle tension, improved flexibility, improved joint mobility, and increased ROM utilizing joint mobilization, connective tissue massage, therapeutic massage, and manual TP therapy.  STM/DTM to L biceps, brachialis, brachioradialis, and wrist flexor/extensor groups to reduce muscle tension and promote increased elbow extension L elbow humeroulnar distraction and elbow  humeroradial posterior glide to increase elbow extension  NEUROMUSCULAR RE-EDUCATION: To improve coordination, kinesthesia, and posture. Hooklying over pool noodle - scap retraction + horizontal ABD 10 x 5, 2 sets Standing RTB scap retraction + straight arm shoulder extension 10 x 5, 2 sets Standing RTB triceps extension 10 x 5, 2 sets   08/05/2023  SELF CARE:  Reviewed eval findings and role of PT in addressing identified deficits as well as instruction in initial HEP (see below).   PATIENT EDUCATION:  Education details: progress with PT, ongoing PT POC, and continue with current HEP  Person educated: Patient Education method: Explanation Education comprehension: verbalized understanding  HOME EXERCISE PROGRAM: Access Code: [email or web address] URL: [email or web address] Date: 08/27/2023 Prepared by: Elijah Hidden  Exercises - Seated Wrist Extension Stretch  - 1-2 x daily - 7 x weekly - 3 reps - 30 sec hold - Seated Wrist Flexion Stretch  - 1-2 x daily - 7 x weekly - 3 reps - 30 sec hold - Anterior Shoulder and Biceps Stretch  - 1-2 x daily - 7 x weekly - 3 reps - 30 sec hold - Bicep Stretch at Table  - 1-2 x daily - 7 x weekly - 3 reps - 30 sec hold - Doorway Pec Stretch at 60 Degrees Abduction with Arm Straight (Mirrored)  - 1-2 x daily - 7 x weekly - 3 reps - 30 sec hold - Forearm Pronation and Supination with Hammer  - 1 x daily - 7 x weekly - 2 sets - 10 reps - 5 sec hold - Supine Shoulder Horizontal Abduction with Resistance  - 1 x daily - 3-4 x weekly - 2 sets - 10 reps - 3 sec hold - Scapular Retraction with Resistance Advanced  - 1 x daily - 3-4 x weekly - 2 sets - 10 reps - 5 sec hold - Standing Elbow Extension with Anchored Resistance  - 1 x daily - 3-4 x weekly - 2 sets - 10 reps - 3 sec hold   ASSESSMENT:  CLINICAL IMPRESSION: Va denies pain today.  He reports no issues with HEP - STG #1 met.  Continued MT focusing on STM, IASTM and joint mobilization to  improve L elbow extension with extension down to 14 after MT.  Gains also noted in elbow flexion with L flexion ROM now symmetrical to R, as well as improvement in L pronation/supination.  Hand weight/dumbbell utilized for LLLD elbow extension stretch as well as progression of triceps and postural posterior shoulder strengthening.  Noris will benefit from  continued skilled PT to address ongoing ROM and strength deficits to improve mobility and activity tolerance.  OBJECTIVE IMPAIRMENTS: decreased activity tolerance, decreased knowledge of condition, decreased ROM, hypomobility, increased fascial restrictions, impaired perceived functional ability, increased muscle spasms, impaired flexibility, improper body mechanics, and postural dysfunction.   ACTIVITY LIMITATIONS: carrying and lifting  PARTICIPATION LIMITATIONS: driving and occupation  PERSONAL FACTORS: Past/current experiences, Profession, Time since onset of injury/illness/exacerbation, and 3+ comorbidities: Chronic thoracic and low back pain, h/o vertebral fracture, neck surgery, HTN, NSTEMI, prediabetes, HOH/hearing loss - getting hearing aids in August  are also affecting patient's functional outcome.   REHAB POTENTIAL: Good  CLINICAL DECISION MAKING: Evolving/moderate complexity  EVALUATION COMPLEXITY: Moderate   GOALS: Goals reviewed with patient? Yes  SHORT TERM GOALS: Target date: 09/02/2023  Patient will be independent with initial HEP to improve outcomes and carryover.  Baseline: Initial HEP provided on eval 08/27/23 - HEP reviewed today Goal status: MET - 09/03/23  2.  Patient to improve L elbow extension AROM to <10 without pain provocation to allow for increased ease of ADLs.  Baseline: L elbow extension 19 Goal status: IN PROGRESS - 09/03/23 - L elbow extension 14  LONG TERM GOALS: Target date: 09/30/2023  Patient will be independent with ongoing/advanced HEP for self-management at home.  Baseline:  Goal status:  INITIAL  2.  Patient to demonstrate improved upright posture with posterior shoulder girdle engaged to promote improved functional UE use. Baseline:  Goal status: INITIAL  3.  Patient to improve L elbow flexion and extension AROM to [facility] without pain provocation to allow for increased ease of ADLs.  Baseline: L elbow 19-126 Goal status: IN PROGRESS - 09/03/23 - L elbow 14-139 (met for flexion ROM - symmetrical to R)  4.  Patient to report ability to perform ADLs, household, and work-related tasks without limitation due to L elbow pain, LOM or weakness. Baseline:  Goal status: INITIAL   PLAN:  PT FREQUENCY: 1x/week  PT DURATION: 6-8 weeks  PLANNED INTERVENTIONS: 02835- PT Re-evaluation, 97750- Physical Performance Testing, 97110-Therapeutic exercises, 97530- Therapeutic activity, [id number]- Neuromuscular re-education, 97535- Self Care, 02859- Manual therapy, G0283- Electrical stimulation (unattended), 97016- Vasopneumatic device, [id number]- Ultrasound, 02966- Ionotophoresis 4mg /ml Dexamethasone , Patient/Family education, Taping, Dry Needling, Joint mobilization, Cryotherapy, and Moist heat  PLAN FOR NEXT SESSION: MT +/- TPDN to address abnormal muscle tension in supinator, biceps and wrist flexor group; L elbow extension ROM and stretching/strengthening   Elijah CHRISTELLA Hidden, PT 09/03/2023, 5:11 PM

## 2023-09-05 ENCOUNTER — Other Ambulatory Visit: Payer: Self-pay

## 2023-09-06 ENCOUNTER — Telehealth: Payer: Self-pay | Admitting: *Deleted

## 2023-09-06 NOTE — Telephone Encounter (Signed)
-----   Message from [facility] S sent at 08/26/2023  2:21 PM EDT ----- Regarding: Plavix  hold reminder (per pt request) Patient requested another call to remind him to start holding his Plavix  on 09/07/23 for his upcoming colonoscopy with Dr Yvone Herd on 09/12/23.

## 2023-09-06 NOTE — Telephone Encounter (Signed)
 Patient informed to hold Plavix  starting tomorrow. Patient voiced understanding.

## 2023-09-10 ENCOUNTER — Ambulatory Visit (HOSPITAL_COMMUNITY)
Admission: RE | Admit: 2023-09-10 | Discharge: 2023-09-10 | Disposition: A | Discharge status: Home / Self Care | Source: Ambulatory Visit | Attending: Physician Assistant | Admitting: Physician Assistant

## 2023-09-10 ENCOUNTER — Ambulatory Visit: Admitting: Physical Therapy

## 2023-09-10 DIAGNOSIS — D649 Anemia, unspecified: Secondary | ICD-10-CM | POA: Diagnosis present

## 2023-09-10 DIAGNOSIS — Z872 Personal history of diseases of the skin and subcutaneous tissue: Secondary | ICD-10-CM | POA: Insufficient documentation

## 2023-09-10 DIAGNOSIS — R6889 Other general symptoms and signs: Secondary | ICD-10-CM | POA: Insufficient documentation

## 2023-09-10 MED ORDER — SODIUM CHLORIDE (PF) 0.9 % IJ SOLN
INTRAMUSCULAR | Status: AC
Start: 2023-09-10 — End: 2023-09-10
  Filled 2023-09-10: qty 50

## 2023-09-10 MED ORDER — IOHEXOL 300 MG/ML  SOLN
100.0000 mL | Freq: Once | INTRAMUSCULAR | Status: AC | PRN
  Administered 2023-09-10: 100 mL via INTRAVENOUS

## 2023-09-10 MED ORDER — IOHEXOL 9 MG/ML PO SOLN
500.0000 mL | ORAL | Status: AC
  Administered 2023-09-10 (×2): 500 mL via ORAL

## 2023-09-10 MED ORDER — IOHEXOL 9 MG/ML PO SOLN
ORAL | Status: AC
  Filled 2023-09-10: qty 1000

## 2023-09-10 NOTE — Progress Notes (Deleted)
 [facility] History and Physical   Primary Care Physician:  Theotis Haze ORN, NP   Reason for Procedure:  History of anemia, perianal abscess  Plan:    Colonoscopy     HPI: Corey Hale is a 58 y.o. male undergoing colonoscopy for investigation of a history of anemia as well as perianal abscess.  Patient was noted to have a slightly low hemoglobin in the setting of heart catheterization when he experienced a myocardial infarction last fall.  CBC was normal in April 2025.  He has had a history of perirectal abscess diagnosed in 2019 with ongoing intermittent symptoms.  Recent CTAP did not show any evidence of inflammation of the bowel or active perirectal abscess.  No known family history of colorectal cancer or polyps.   Past Medical History:  Diagnosis Date   Chronic back pain    Chronic sinusitis    nasal polyps, turbinate hypertrophy, deviated septum   Erectile dysfunction    Goiter    Herpes simplex    Hypercholesteremia    Hypertension    Perirectal abscess    Skin abscess    Thyroid  disease    Tobacco dependence     Past Surgical History:  Procedure Laterality Date   ARM HARDWARE REMOVAL Right    INCISION AND DRAINAGE PERIRECTAL ABSCESS N/A 07/22/2017   Procedure: IRRIGATION, DEBRIDEMENT AND DRAINAGE PERIRECTAL ABSCESS;  Surgeon: Tanda Locus, MD;  Location: [facility] OR;  Service: General;  Laterality: N/A;   LEFT HEART CATH AND CORONARY ANGIOGRAPHY N/A 12/05/2022   Procedure: LEFT HEART CATH AND CORONARY ANGIOGRAPHY;  Surgeon: Darron Deatrice LABOR, MD;  Location: [facility];  Service: Cardiovascular;  Laterality: N/A;   NASAL SEPTOPLASTY W/ TURBINOPLASTY Bilateral 11/13/2019   Procedure: NASAL SEPTOPLASTY WITH TURBINATE REDUCTION;  Surgeon: Carlie Clark, MD;  Location: [facility] OR;  Service: ENT;  Laterality: Bilateral;   NECK SURGERY     PAROTIDECTOMY     Dr Carlie   SINUS ENDO WITH FUSION Bilateral 11/13/2019   Procedure: SINUS ENDO WITH FUSION;  Surgeon:  Carlie Clark, MD;  Location: [facility] OR;  Service: ENT;  Laterality: Bilateral;   THYROIDECTOMY, PARTIAL  2008   Dr Curvin    Prior to Admission medications   Medication Sig Start Date End Date Taking? Authorizing Provider  aspirin  81 MG tablet Take 1 tablet (81 mg total) by mouth daily. 03/07/15   Jegede, Olugbemiga E, MD  atorvastatin  (LIPITOR) 40 MG tablet Take 1 tablet (40 mg total) by mouth daily. 04/15/23   Fleming, Zelda W, NP  clopidogrel  (PLAVIX ) 75 MG tablet Take 1 tablet (75 mg total) by mouth daily with breakfast. 09/02/23   Verlin Lonni JONETTA, MD  colchicine  0.6 MG tablet Take 1 tablet (0.6 mg total) by mouth 2 (two) times daily. 02/20/23   Henry Manuelita NOVAK, NP  famotidine  (PEPCID ) 40 MG tablet Take 1 tablet (40 mg total) by mouth daily. For acid reflux 07/29/23   Fleming, Zelda W, NP  gabapentin  (NEURONTIN ) 600 MG tablet Take 0.5-1 tablets (300-600 mg total) by mouth 3 (three) times daily. 07/16/23   Fleming, Zelda W, NP  metoprolol  succinate (TOPROL  XL) 25 MG 24 hr tablet Take 1 tablet (25 mg total) by mouth daily. 04/15/23   Wyn Jackee VEAR Mickey., NP  naloxone  (NARCAN ) nasal spray 4 mg/0.1 mL Use 1 (one) spray as needed. 07/01/23     nitroGLYCERIN  (NITROSTAT ) 0.4 MG SL tablet Place 1 tablet (0.4 mg total) under the tongue every 5 (five) minutes  as needed. 12/06/22   Henry Manuelita NOVAK, NP  Oxycodone  HCl 20 MG TABS Take 1 Tablet by mouth four times daily, as needed 12/12/22     Oxycodone  HCl 20 MG TABS Take 1 Tablet by mouth 4 times daily, as needed 12/18/22     Oxycodone  HCl 20 MG TABS Take 1 Tablet by mouth four times daily, as needed 01/14/23     Oxycodone  HCl 20 MG TABS Take 1 tablet (20 mg total) by mouth 4 (four) times daily as needed. 08/01/23     Oxycodone  HCl 20 MG TABS Take 1 tablet (20 mg total) by mouth 4 (four) times daily as needed 08/30/23     Oxycodone  HCl 20 MG TABS Take 1 tablet (20 mg total) by mouth every 4 (four) hours as needed 08/30/23     traZODone  (DESYREL ) 100 MG  tablet Take 1 tablet (100 mg total) by mouth at bedtime. FOR SLEEP 04/15/23   Fleming, Zelda W, NP  valACYclovir  (VALTREX ) 1000 MG tablet Take 1 tablet (1,000 mg total) by mouth daily. 08/20/23   Newlin, Enobong, MD    Current Outpatient Medications  Medication Sig Dispense Refill   aspirin  81 MG tablet Take 1 tablet (81 mg total) by mouth daily. 90 tablet 3   atorvastatin  (LIPITOR) 40 MG tablet Take 1 tablet (40 mg total) by mouth daily. 90 tablet 1   clopidogrel  (PLAVIX ) 75 MG tablet Take 1 tablet (75 mg total) by mouth daily with breakfast. 90 tablet 2   colchicine  0.6 MG tablet Take 1 tablet (0.6 mg total) by mouth 2 (two) times daily. 180 tablet 3   famotidine  (PEPCID ) 40 MG tablet Take 1 tablet (40 mg total) by mouth daily. For acid reflux 90 tablet 1   gabapentin  (NEURONTIN ) 600 MG tablet Take 0.5-1 tablets (300-600 mg total) by mouth 3 (three) times daily. 270 tablet 1   metoprolol  succinate (TOPROL  XL) 25 MG 24 hr tablet Take 1 tablet (25 mg total) by mouth daily. 90 tablet 3   naloxone  (NARCAN ) nasal spray 4 mg/0.1 mL Use 1 (one) spray as needed. 2 each 1   nitroGLYCERIN  (NITROSTAT ) 0.4 MG SL tablet Place 1 tablet (0.4 mg total) under the tongue every 5 (five) minutes as needed. 25 tablet 2   Oxycodone  HCl 20 MG TABS Take 1 Tablet by mouth four times daily, as needed 28 tablet 0   Oxycodone  HCl 20 MG TABS Take 1 Tablet by mouth 4 times daily, as needed 120 tablet 0   Oxycodone  HCl 20 MG TABS Take 1 Tablet by mouth four times daily, as needed 120 tablet 0   Oxycodone  HCl 20 MG TABS Take 1 tablet (20 mg total) by mouth 4 (four) times daily as needed. 120 tablet 0   Oxycodone  HCl 20 MG TABS Take 1 tablet (20 mg total) by mouth 4 (four) times daily as needed 120 tablet 0   Oxycodone  HCl 20 MG TABS Take 1 tablet (20 mg total) by mouth every 4 (four) hours as needed 180 tablet 0   traZODone  (DESYREL ) 100 MG tablet Take 1 tablet (100 mg total) by mouth at bedtime. FOR SLEEP 90 tablet 1    valACYclovir  (VALTREX ) 1000 MG tablet Take 1 tablet (1,000 mg total) by mouth daily. 90 tablet 3   No current facility-administered medications for this visit.    Allergies as of 09/12/2023 - Review Complete 09/10/2023  Allergen Reaction Noted   Pork-derived products  11/11/2019    Family History  Problem  Relation Age of Onset   Stroke Mother    Stroke Father    Colon cancer Neg Hx    Stomach cancer Neg Hx    Esophageal cancer Neg Hx     Social History   Socioeconomic History   Marital status: Married    Spouse name: Not on file   Number of children: 0   Years of education: Not on file   Highest education level: Not on file  Occupational History   Not on file  Tobacco Use   Smoking status: Former    Current packs/day: 3.00    Average packs/day: 3.0 packs/day for 22.0 years (66.0 ttl pk-yrs)    Types: Cigarettes   Smokeless tobacco: Never   Tobacco comments:    3 to 4 cigs a day  Vaping Use   Vaping status: Never Used  Substance and Sexual Activity   Alcohol use: No    Alcohol/week: 0.0 standard drinks of alcohol   Drug use: No   Sexual activity: Yes  Other Topics Concern   Not on file  Social History Narrative   ** Merged History Encounter **       Social Drivers of Health   Financial Resource Strain: Patient Declined (04/15/2023)   Overall Financial Resource Strain (CARDIA)    Difficulty of Paying Living Expenses: Patient declined  Food Insecurity: No Food Insecurity (12/05/2022)   Hunger Vital Sign    Worried About Running Out of Food in the Last Year: Never true    Ran Out of Food in the Last Year: Never true  Transportation Needs: No Transportation Needs (12/05/2022)   PRAPARE - Administrator, Civil Service (Medical): No    Lack of Transportation (Non-Medical): No  Physical Activity: Patient Declined (04/15/2023)   Exercise Vital Sign    Days of Exercise per Week: Patient declined    Minutes of Exercise per Session: Patient declined   Stress: No Stress Concern Present (04/15/2023)   [employer] of Occupational Health - Occupational Stress Questionnaire    Feeling of Stress : Not at all  Social Connections: Socially Integrated (04/15/2023)   Social Connection and Isolation Panel    Frequency of Communication with Friends and Family: More than three times a week    Frequency of Social Gatherings with Friends and Family: More than three times a week    Attends Religious Services: 1 to 4 times per year    Active Member of [employer] or Organizations: No    Attends Banker Meetings: 1 to 4 times per year    Marital Status: Married  Catering manager Violence: Not At Risk (12/05/2022)   Humiliation, Afraid, Rape, and Kick questionnaire    Fear of Current or Ex-Partner: No    Emotionally Abused: No    Physically Abused: No    Sexually Abused: No    Review of Systems:  All other review of systems negative except as mentioned in the HPI.  Physical Exam: Vital signs There were no vitals taken for this visit.  General:   Alert,  Well-developed, well-nourished, pleasant and cooperative in NAD Airway:  Mallampati  Lungs:  Clear throughout to auscultation.   Heart:  Regular rate and rhythm; no murmurs, clicks, rubs,  or gallops. Abdomen:  Soft, nontender and nondistended. Normal bowel sounds.   Neuro/Psych:  Normal mood and affect. A and O x 3  Inocente Hausen, MD [facility]

## 2023-09-11 ENCOUNTER — Telehealth: Payer: Self-pay | Admitting: Pediatrics

## 2023-09-11 ENCOUNTER — Ambulatory Visit: Payer: Self-pay | Admitting: Physician Assistant

## 2023-09-11 NOTE — Telephone Encounter (Signed)
 Good morning Dr. Suzann,   Patient wished to rescheduled 6/26 colonoscopy due to eating foods on restricted diet list and also taking medications that he was advised to hold prior to colonoscopy. Patient has been rescheduled for 7/31.   Thank you.

## 2023-09-12 ENCOUNTER — Encounter: Admitting: Pediatrics

## 2023-09-14 ENCOUNTER — Other Ambulatory Visit: Payer: Self-pay | Admitting: Nurse Practitioner

## 2023-09-14 DIAGNOSIS — F5101 Primary insomnia: Secondary | ICD-10-CM

## 2023-09-16 ENCOUNTER — Other Ambulatory Visit: Payer: Self-pay

## 2023-09-16 MED ORDER — TRAZODONE HCL 100 MG PO TABS
100.0000 mg | ORAL_TABLET | Freq: Every day | ORAL | 1 refills | Status: DC
  Filled 2023-09-16 – 2023-09-27 (×2): qty 90, 90d supply, fill #0
  Filled 2023-12-13 – 2023-12-30 (×3): qty 90, 90d supply, fill #1

## 2023-09-24 ENCOUNTER — Ambulatory Visit: Attending: Nurse Practitioner | Admitting: Physical Therapy

## 2023-09-24 DIAGNOSIS — R293 Abnormal posture: Secondary | ICD-10-CM | POA: Insufficient documentation

## 2023-09-24 DIAGNOSIS — M25622 Stiffness of left elbow, not elsewhere classified: Secondary | ICD-10-CM | POA: Insufficient documentation

## 2023-09-27 ENCOUNTER — Other Ambulatory Visit (HOSPITAL_COMMUNITY): Payer: Self-pay

## 2023-09-27 DIAGNOSIS — Z79899 Other long term (current) drug therapy: Secondary | ICD-10-CM | POA: Diagnosis not present

## 2023-09-27 DIAGNOSIS — M5030 Other cervical disc degeneration, unspecified cervical region: Secondary | ICD-10-CM | POA: Diagnosis not present

## 2023-10-01 ENCOUNTER — Ambulatory Visit: Admitting: Physical Therapy

## 2023-10-01 ENCOUNTER — Encounter: Payer: Self-pay | Admitting: Physical Therapy

## 2023-10-01 DIAGNOSIS — M25622 Stiffness of left elbow, not elsewhere classified: Secondary | ICD-10-CM

## 2023-10-01 DIAGNOSIS — R293 Abnormal posture: Secondary | ICD-10-CM

## 2023-10-01 NOTE — Therapy (Signed)
 OUTPATIENT PHYSICAL THERAPY TREATMENT / DISCHARGE SUMMARY   Patient Name: Corey Hale MRN: [id number] DOB:18-Dec-1965, 58 y.o., male Today's Date: 10/01/2023  END OF SESSION:  PT End of Session - 10/01/23 1622     Visit Number 4    Date for PT Re-Evaluation 09/30/23    Authorization Type [employer]    PT Start Time 1622    PT Stop Time 1708    PT Time Calculation (min) 46 min    Activity Tolerance Patient tolerated treatment well    Behavior During Therapy WFL for tasks assessed/performed             Past Medical History:  Diagnosis Date   Chronic back pain    Chronic sinusitis    nasal polyps, turbinate hypertrophy, deviated septum   Erectile dysfunction    Goiter    Herpes simplex    Hypercholesteremia    Hypertension    Perirectal abscess    Skin abscess    Thyroid  disease    Tobacco dependence    Past Surgical History:  Procedure Laterality Date   ARM HARDWARE REMOVAL Right    INCISION AND DRAINAGE PERIRECTAL ABSCESS N/A 07/22/2017   Procedure: IRRIGATION, DEBRIDEMENT AND DRAINAGE PERIRECTAL ABSCESS;  Surgeon: Tanda Locus, MD;  Location: [facility] OR;  Service: General;  Laterality: N/A;   LEFT HEART CATH AND CORONARY ANGIOGRAPHY N/A 12/05/2022   Procedure: LEFT HEART CATH AND CORONARY ANGIOGRAPHY;  Surgeon: Darron Deatrice LABOR, MD;  Location: [facility];  Service: Cardiovascular;  Laterality: N/A;   NASAL SEPTOPLASTY W/ TURBINOPLASTY Bilateral 11/13/2019   Procedure: NASAL SEPTOPLASTY WITH TURBINATE REDUCTION;  Surgeon: Carlie Clark, MD;  Location: [facility] OR;  Service: ENT;  Laterality: Bilateral;   NECK SURGERY     PAROTIDECTOMY     Dr Carlie   SINUS ENDO WITH FUSION Bilateral 11/13/2019   Procedure: SINUS ENDO WITH FUSION;  Surgeon: Carlie Clark, MD;  Location: [facility] OR;  Service: ENT;  Laterality: Bilateral;   THYROIDECTOMY, PARTIAL  2008   Dr Curvin   Patient Active Problem List   Diagnosis Date Noted   NSTEMI (non-ST elevated myocardial infarction) (HCC)  12/04/2022   Prediabetes 09/26/2021   Bilateral impacted cerumen 09/26/2021   Subjective tinnitus of both ears 07/24/2019   Bilateral sensorineural hearing loss 01/01/2019   Hyperlipidemia 07/21/2017   History of abscess of skin and subcutaneous tissue 07/21/2017   Mid back pain, chronic 05/26/2015   Chronic midline low back pain without sciatica 05/26/2015   Midline thoracic back pain 03/07/2015   History of vertebral fracture 05/20/2014   Essential hypertension 02/23/2014   Lipoma of face 09/07/2013   Hearing loss 09/07/2013   Dental caries 06/10/2012   Erectile dysfunction 06/10/2012   Tobacco abuse 06/10/2012   Personal history of goiter 06/10/2012   Genital herpes 03/05/2012   Thoracic back pain 07/21/2009   THYROID  NODULE, HX OF 07/21/2009    PCP: Theotis Haze ORN, NP   REFERRING PROVIDER: Theotis Haze ORN, NP   REFERRING DIAG: [phone] (ICD-10-CM) - Contracture of muscle of left forearm   THERAPY DIAG:  Stiffness of left elbow, not elsewhere classified  Abnormal posture  RATIONALE FOR EVALUATION AND TREATMENT: Rehabilitation  ONSET DATE: ~2-3 yrs  NEXT MD VISIT: 01/15/24   SUBJECTIVE:  SUBJECTIVE STATEMENT: Pt denies pain recently.  Elbow maybe a little straighter but not able to fully straighten.  EVAL:  Pt reports his L elbow has been unable full straighten for 2-3 years.  He cannot recall any MOI, but states he does a lot of driving and thinks it may related to his arm position while driving.  No pain normally but did have some pain a few days ago in proximal wrist flexor group/medial epicondyle after carrying something heavy but now resolved.  He denies any awareness of weakness.   PAIN: Are you having pain? No  PERTINENT HISTORY:  Chronic thoracic and low  back pain, h/o vertebral fracture, neck surgery, HTN, NSTEMI, prediabetes, HOH/hearing loss - getting hearing aids in August   PRECAUTIONS: None  HAND DOMINANCE: Right  RED FLAGS: None  WEIGHT BEARING RESTRICTIONS: No  FALLS:  Has patient fallen in last 6 months? 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  PLOF: Independent and Leisure: mostly sedentary  PATIENT GOALS: To get my elbow straight   OBJECTIVE: (objective measures completed at initial evaluation unless otherwise dated)  DIAGNOSTIC FINDINGS:  DG of L humerus and shoulder ordered for 08/16/23  PATIENT SURVEYS:  Quick Dash 0.0 / 100 = 0.0 %  COGNITION: Overall cognitive status: Within functional limits for tasks assessed     SENSATION: WFL  POSTURE: Mild forward head and rounded shoulders  UPPER EXTREMITY ROM:   Active ROM Right eval Left eval L 09/03/23 L 10/01/23  Shoulder flexion      Shoulder extension      Shoulder abduction      Shoulder adduction      Shoulder internal rotation      Shoulder external rotation      Elbow flexion 140 126 139 141  Elbow extension 0 19 14 10   Wrist flexion      Wrist extension      Wrist ulnar deviation      Wrist radial deviation      Wrist pronation 95 78 85 88  Wrist supination 98 84 92 95  (Blank rows = not tested)  UPPER EXTREMITY MMT: B shoulders. Elbows, forearms and wrists 5/5 MMT Right eval Left eval  Grip strength (lbs) 65 # 73 #  (Blank rows = not tested)  JOINT MOBILITY TESTING:  Limited joint mobility at ulnohumeral joint, radiocapitellar joint and proximal radioulnar joint  PALPATION:  TTP over L wrist flexor group and supinator but not medial epicondyle. Increased muscle tension in L wrist extensors but not TTP. TTP in L biceps.    TODAY'S TREATMENT:   10/01/2023 THERAPEUTIC EXERCISE: To improve strength, endurance, ROM,  and flexibility.  Demonstration, verbal and tactile cues throughout for technique.   UBE: L4.0 x 6 min (3' each fwd & back) L wrist extension stretch with elbow straight and forearm supinated 2 x 30 L wrist flexion stretch with elbow straight and forearm pronated 2 x 30 Doorway pec stretch at 60 degrees abduction with arm straight 2 x 30 L anterior shoulder and biceps stretch in sitting with arm extended behind body x 30 Seated forearm pronation and supination with hammer held at end of shaft 10 x 5 Seated gravity assisted L elbow extension LLLD stretch with 3# db 2 x 60, pillow under upper arm supported on table  NEUROMUSCULAR RE-EDUCATION: To improve kinesthesia and posture.  Seated GTB scap retraction + horizontal ABD  10 x 5 Standing GTB scap retraction + straight arm shoulder extension 10 x 5 Standing GTB B triceps extension with elbows at sides 10 x 5 Standing GTB scap retraction + horizontal ABD with band anchored in doorframe 10 x 5 -patient preferring freestanding horizontal ABD Standing GTB B triceps extension at 90 shoulder flexion 10 x 5   09/03/2023 THERAPEUTIC EXERCISE: To improve strength, endurance, ROM, and flexibility.  Demonstration, verbal and tactile cues throughout for technique.  UBE: L3.0 x 6 min (3' each fwd & back) Supine gravity assisted L elbow extension LLLD stretch with 3# db 4 x 30, towel roll under upper arm  MANUAL THERAPY: To promote normalized muscle tension, improved flexibility, improved joint mobility, and increased ROM utilizing joint mobilization, connective tissue massage, therapeutic massage, and manual TP therapy.  STM/DTM and IASTM with edge tool to L biceps, brachialis, brachioradialis, and wrist flexor/extensor groups to reduce muscle tension and promote increased elbow extension L elbow humeroulnar distraction and elbow humeroradial posterior glide to increase elbow extension  NEUROMUSCULAR RE-EDUCATION: To improve coordination,  kinesthesia, and posture. Prone L elbow extension 3# x 10 Prone L scap retraction + shoulder extension with elbow extension 3# x 10 Prone L scap retraction + shoulder horiz ABD with elbow extension 3# x 10   08/27/2023  THERAPEUTIC EXERCISE: To improve ROM and flexibility.  Demonstration, verbal and tactile cues throughout for technique.  UBE: L1.0 x 6 min (3' each fwd & back) L wrist extension stretch with elbow straight and forearm supinated 2 x 30 L wrist flexion stretch with elbow straight and forearm pronated 2 x 30 L anterior shoulder and biceps stretch in sitting with arm extended behind body x 30 L bicep stretch at table x 30 Doorway pec stretch at 60 degrees abduction with arm straight 2 x 30 Hookying pec stretch over pool noodle at 90 ABD x 60 Seated forearm pronation and supination with hammer 10 x 5  MANUAL THERAPY: To promote normalized muscle tension, improved flexibility, improved joint mobility, and increased ROM utilizing joint mobilization, connective tissue massage, therapeutic massage, and manual TP therapy.  STM/DTM to L biceps, brachialis, brachioradialis, and wrist flexor/extensor groups to reduce muscle tension and promote increased elbow extension L elbow humeroulnar distraction and elbow humeroradial posterior glide to increase elbow extension  NEUROMUSCULAR RE-EDUCATION: To improve coordination, kinesthesia, and posture. Hooklying over pool noodle - scap retraction + horizontal ABD 10 x 5, 2 sets Standing RTB scap retraction + straight arm shoulder extension 10 x 5, 2 sets Standing RTB triceps extension 10 x 5, 2 sets   08/05/2023  SELF CARE:  Reviewed eval findings and role of PT in addressing identified deficits as well as instruction in initial HEP (see below).   PATIENT EDUCATION:  Education details: HEP review, HEP progression, continue with current HEP, and recommended frequency for ongoing HEP at discharge to prevent loss of gains achieved  with PT  Person educated: Patient Education method: Explanation, Demonstration, Verbal cues, and Handouts Education comprehension: verbalized understanding and returned demonstration  HOME EXERCISE PROGRAM: Access Code: [email or web address] URL: [email or web address] Date: 10/01/2023 Prepared by: Elijah Hidden  Exercises - Seated Wrist Extension Stretch  - 1-2 x daily - 7 x weekly - 3 reps - 30 sec hold - Seated Wrist Flexion Stretch  - 1-2 x daily - 7 x weekly - 3 reps - 30 sec hold - Anterior Shoulder and Biceps Stretch  - 1-2 x daily - 7 x weekly - 3 reps - 30 sec hold -  Bicep Stretch at Table  - 1-2 x daily - 7 x weekly - 3 reps - 30 sec hold - Doorway Pec Stretch at 60 Degrees Abduction with Arm Straight (Mirrored)  - 1-2 x daily - 7 x weekly - 3 reps - 30 sec hold - Forearm Pronation and Supination with Hammer  - 1 x daily - 7 x weekly - 2 sets - 10 reps - 5 sec hold - Seated Elbow PROM Blocked Extension  - 1 x daily - 7 x weekly - 2-3 reps - 30-60 sec hold - Scapular Retraction with Resistance Advanced  - 1 x daily - 3-4 x weekly - 2 sets - 10 reps - 5 sec hold - Standing Elbow Extension with Anchored Resistance  - 1 x daily - 3-4 x weekly - 2 sets - 10 reps - 3 sec hold - Standing Shoulder Horizontal Abduction with Resistance  - 1 x daily - 3-4 x weekly - 2 sets - 10 reps - 3 sec hold   ASSESSMENT:  CLINICAL IMPRESSION: Nimai returns to PT after 4 weeks since last visit.  He continues to deny pain.  L elbow ROM continues to improve with current AROM 10-141 with continued gains also noted in L forearm pronation and supination.  Despite slight ongoing limitation with L elbow extension ROM, he denies any limitation with everyday functional activities.  Overall B UE strength remains 5/5.  HEP reviewed and updated to reflect ongoing stretching to improve extension ROM as well as continued strengthening of postural and posterior chain muscles to promote full functional extension ROM.   All PT goals now met and Elijah is ready to transition to his HEP, therefore will proceed with discharge from physical therapy for this episode.  OBJECTIVE IMPAIRMENTS: decreased activity tolerance, decreased knowledge of condition, decreased ROM, hypomobility, increased fascial restrictions, impaired perceived functional ability, increased muscle spasms, impaired flexibility, improper body mechanics, and postural dysfunction.   ACTIVITY LIMITATIONS: carrying and lifting  PARTICIPATION LIMITATIONS: driving and occupation  PERSONAL FACTORS: Past/current experiences, Profession, Time since onset of injury/illness/exacerbation, and 3+ comorbidities: Chronic thoracic and low back pain, h/o vertebral fracture, neck surgery, HTN, NSTEMI, prediabetes, HOH/hearing loss - getting hearing aids in August  are also affecting patient's functional outcome.   REHAB POTENTIAL: Good  CLINICAL DECISION MAKING: Evolving/moderate complexity  EVALUATION COMPLEXITY: Moderate   GOALS: Goals reviewed with patient? Yes  SHORT TERM GOALS: Target date: 09/02/2023  Patient will be independent with initial HEP to improve outcomes and carryover.  Baseline: Initial HEP provided on eval 08/27/23 - HEP reviewed today Goal status: MET - 09/03/23  2.  Patient to improve L elbow extension AROM to <10 without pain provocation to allow for increased ease of ADLs.  Baseline: L elbow extension 19 09/03/23 - L elbow extension 14 Goal status: MET - 10/01/23 - L elbow extension 10  LONG TERM GOALS: Target date: 09/30/2023  Patient will be independent with ongoing/advanced HEP for self-management at home.  Baseline:  Goal status: MET - 10/01/23   2.  Patient to demonstrate improved upright posture with posterior shoulder girdle engaged to promote improved functional UE use. Baseline:  Goal status: MET - 10/01/23   3.  Patient to improve L elbow flexion and extension AROM to [facility] without pain provocation to allow for  increased ease of ADLs.  Baseline: L elbow 19-126 09/03/23 - L elbow 14-139 (met for flexion ROM - symmetrical to R) Goal status: MET - 10/01/23 - L elbow 10-141 (flexion ROM -  symmetrical to R)  4.  Patient to report ability to perform ADLs, household, and work-related tasks without limitation due to L elbow pain, LOM or weakness. Baseline:  Goal status: MET - 10/01/23    PLAN:  PT FREQUENCY: 1x/week  PT DURATION: 6-8 weeks  PLANNED INTERVENTIONS: 02835- PT Re-evaluation, 97750- Physical Performance Testing, 97110-Therapeutic exercises, 97530- Therapeutic activity, [id number]- Neuromuscular re-education, 97535- Self Care, 02859- Manual therapy, [id number]- Electrical stimulation (unattended), [id number]- Vasopneumatic device, 97035- Ultrasound, 02966- Ionotophoresis 4mg /ml Dexamethasone , Patient/Family education, Taping, Dry Needling, Joint mobilization, Cryotherapy, and Moist heat  PLAN FOR NEXT SESSION: Transition to HEP with discharge from PT    PHYSICAL THERAPY DISCHARGE SUMMARY  Visits from Start of Care: 4  Current functional level related to goals / functional outcomes: Refer to above clinical impression and goal assessment.    Remaining deficits: L elbow extension continues to lack 10 - patient independent with ongoing HEP to promote extension ROM   Education / Equipment: HEP  Patient agrees to discharge. Patient goals were met. Patient is being discharged due to meeting the stated rehab goals.  Elijah CHRISTELLA Hidden, PT 10/01/2023, 7:34 PM

## 2023-10-08 ENCOUNTER — Encounter

## 2023-10-10 NOTE — Progress Notes (Signed)
 Cardiology Office Note:  .   Date:  10/10/2023  ID:  Corey Hale, DOB 10-04-1965, MRN [id number] PCP: Theotis Haze ORN, NP  [facility] Providers Cardiologist:  Lonni Cash, MD {  History of Present Illness: .   Corey Hale is a 58 y.o. male with a past medical history of HTN, prediabetes, dyslipidemia, GERD, tobacco dependence who was seen 12/05/2022 for the evaluation of chest pain.  Initially presented to [facility] emergency room for chest discomfort.  Stated that over the last 3 days prior to admission he had waxing and waning chest discomfort.  That was related to gas as his discomfort seem to worsen with food intake.  Consequently, he had essentially minimal food or drink over the last 3 or so days.  His recurring chest pain had increased in intensity over the last 2 days which ultimately prompted him to present to the ER.  Worsening pain when he laid down and improved with sitting up.  Worsened with deep inspiration.  Stated that he had similar pain in the past around 4 to 5 years ago.  He underwent evaluation which revealed that his discomfort was related to gas pain.  On presentation in the ER he was afebrile, heart rate in 60s, blood pressure 105 to 125 mmHg not requiring supplemental oxygen.  CBC unremarkable outside of slightly elevated WBC 15.2.  BMP with NA 127, K2.4, creatinine 1.04, mag 2.1.  Troponin 8912>> 8232.  EKG with subtle ST elevation in inferior leads but does not meet STEMI criteria.  He was given aspirin  324 mg and heparin  4000 units, morphine  4 mg, KCl 60 mEq p.o. and 20 mEq IV, famotidine  20 mg started on IV heparin  drip and nitroglycerin .  He was then transferred to [facility] and stated he had 5 out of 10 chest discomfort though appeared well.  CAD smoked 2 to 3 cigarettes for multiple years and quit around 2 weeks ago.  Stated that he had a brother with cardiac disease who passed away in his 46s.  Both parents had a stroke.   Ultimately, it was felt that risk of PCI outweighed the benefits as he already develops left-to-right collaterals.  Recommended for medical therapy and started on aspirin  and Plavix  for 1 year.  I saw him 11/24, he has not had any issues with his heart. We discussed his cardiac cath in detail. Slight language barrier and hearing barrier. We discussed why he did not need a stent at this time and about collateral blood flow. We discussed his plavix  and asa x 1 year then plavix  indefinitely. I congratulated him on his smoking cessation. He was started on colchicine  while he was in the hospital for pericarditis and he plans to finish the pills that he has but will not need another prescription. Otherwise, doing well without any chest pains or SOB. Offered cardiac rehab but patient would prefer to do this on his own.   Reports no shortness of breath nor dyspnea on exertion. Reports no chest pain, pressure, or tightness. No edema, orthopnea, PND. Reports no palpitations.   Today, he presents with coronary artery disease who is here for follow-up regarding his cardiac health and medication management.  He underwent cardiac catheterization seven months ago and did not require a stent. He is on dual antiplatelet therapy with Plavix  and aspirin . He experiences occasional vigorous heartbeats, especially when sitting or lying on his left side at night, described as a 'shaking' sensation. No episodes of rapid  heart rate or significant chest pain have occurred in the past six months. He occasionally uses nitroglycerin  for mild discomfort or fatigue (1-2 times in the last year).  His activity level is variable, and he sometimes feels 'lazy'. 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 No ankle swelling is present, and he is not on his feet extensively at work. He is currently out of his morning medication but plans to refill it  today.  Reports no shortness of breath nor dyspnea on exertion. Reports no chest pain, pressure, or tightness. No edema, orthopnea, PND.   Discussed the use of AI scribe software for clinical note transcription with the patient, who gave verbal consent to proceed.  ROS: Pertinent ROS in HPI  Studies Reviewed: .        Cath: 12/05/2022     Prox RCA lesion is 30% stenosed.   Dist RCA lesion is 100% stenosed.   Mid LAD lesion is 30% stenosed.   Dist LAD lesion is 20% stenosed.   There is mild left ventricular systolic dysfunction.   LV end diastolic pressure is normal.   The left ventricular ejection fraction is 55-65% by visual estimate.   1.  The coronary arteries are ectatic with severe one-vessel coronary artery disease.  Occluded mid to distal right coronary artery with large thrombus burden and faint left-to-right collaterals. 2.  Mildly reduced LV systolic function with an EF of 40 to 45% with inferior wall hypokinesis.  Normal left ventricular end-diastolic pressure.   Recommendations: This is a late presenting inferior STEMI and onset of symptoms is 4 days ago.  Due to large thrombus, ectatic and tortuous vessel, risks of PCI outweighed the benefit especially in the setting of left-to-right collaterals.  Recommend medical therapy. 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 I added clopidogrel  to be used for 1 year. I also added colchicine  to be used for few weeks. Wean off nitroglycerin  drip.   Diagnostic Dominance: Co-dominant    Echo: 12/05/2022   IMPRESSIONS     1. Left ventricular ejection fraction, by estimation, is 60 to 65%. The  left ventricle has normal function. The left ventricle has no regional  wall motion abnormalities. Left ventricular diastolic parameters are  consistent with Grade II diastolic  dysfunction (pseudonormalization). There is mild hypokinesis of the left  ventricular, basal inferoseptal  wall, inferior wall and inferolateral  wall.   2. Right ventricular systolic function is normal. The right ventricular  size is normal. There is normal pulmonary artery systolic pressure.   3. Left atrial size was mildly dilated.   4. The mitral valve is normal in structure. No evidence of mitral valve  regurgitation. No evidence of mitral stenosis.   5. The aortic valve is normal in structure. Aortic valve regurgitation is  not visualized. No aortic stenosis is present.   6. The inferior vena cava is normal in size with greater than 50%  respiratory variability, suggesting right atrial pressure of 3 mmHg.   FINDINGS   Left Ventricle: Left ventricular ejection fraction, by estimation, is 60  to 65%. The left ventricle has normal function. The left ventricle has no  regional wall motion abnormalities. Mild hypokinesis of the left  ventricular, basal inferoseptal wall,  inferior wall and inferolateral wall. Definity  contrast agent was given IV  to delineate the left ventricular endocardial borders. The left  ventricular internal  cavity size was normal in size. There is no left  ventricular hypertrophy. Left ventricular  diastolic parameters are consistent with Grade II diastolic dysfunction  (pseudonormalization).   Right Ventricle: The right ventricular size is normal. No increase in  right ventricular wall thickness. Right ventricular systolic function is  normal. There is normal pulmonary artery systolic pressure. The tricuspid  regurgitant velocity is 1.16 m/s, and   with an assumed right atrial pressure of 8 mmHg, the estimated right  ventricular systolic pressure is 13.4 mmHg.   Left Atrium: Left atrial size was mildly dilated.   Right Atrium: Right atrial size was normal in size.   Pericardium: There is no evidence of pericardial effusion.   Mitral Valve: The mitral valve is normal in structure. No evidence of  mitral valve regurgitation. No evidence of mitral valve  stenosis.   Tricuspid Valve: The tricuspid valve is normal in structure. Tricuspid  valve regurgitation is not demonstrated. No evidence of tricuspid  stenosis.   Aortic Valve: The aortic valve is normal in structure. Aortic valve  regurgitation is not visualized. No aortic stenosis is present.   Pulmonic Valve: The pulmonic valve was normal in structure. Pulmonic valve  regurgitation is not visualized. No evidence of pulmonic stenosis.   Aorta: The aortic root is normal in size and structure.   Venous: The inferior vena cava is normal in size with greater than 50%  respiratory variability, suggesting right atrial pressure of 3 mmHg.   IAS/Shunts: No atrial level shunt detected by color flow Doppler.  _____________       Physical Exam:   VS:  There were no vitals taken for this visit.   Wt Readings from Last 3 Encounters:  08/13/23 199 lb (90.3 kg)  07/16/23 197 lb (89.4 kg)  04/15/23 201 lb 3.2 oz (91.3 kg)    GEN: Well nourished, well developed in no acute distress NECK: No JVD; No carotid bruits CARDIAC: RRR, no murmurs, rubs, gallops RESPIRATORY:  Clear to auscultation without rales, wheezing or rhonchi  ABDOMEN: Soft, non-tender, non-distended EXTREMITIES:  No edema; No deformity   ASSESSMENT AND PLAN: .    Coronary artery disease Managed with dual antiplatelet therapy due to collateral blood flow. No stent needed. Palpitations and chest vibrations not indicative of acute coronary syndrome. Blood pressure stable. No recent nitroglycerin  use. Cholesterol and kidney function normal. - Continue Plavix  and aspirin  until November 2025. - Discontinue aspirin  and continue Plavix  after November 2025 - Order fasting lipid panel and LFTs. - Refill cardiac medications.  Anxiety Anxiety impacts mental well-being more than physical health. - Encourage regular exercise, 30 minutes most days. -address with PCP  NSTEMI -no PCI needed due to collateral blood flow -Would  recommend continuing current medication regimen which includes aspirin  81 mg daily, Lipitor 40 mg daily, Plavix  25 mg daily, colchicine  0.6 mg twice a day, Lopressor  12.5 mg twice a day, nitro as needed -no chest pain or SOB -He should be off colchicine     HTN -Blood pressure well-controlled today 124/76 -No medication changes today -Would encourage low-sodium, heart healthy diet   Palpitations -more forceful heart beats but rate remains normal -heard more before bed -likely driven by anxiety      Dispo: He can follow-up with Dr. Verlin or APP in 5 months  Signed, Corey LOISE Fabry, PA-C

## 2023-10-11 ENCOUNTER — Other Ambulatory Visit: Payer: Self-pay

## 2023-10-14 ENCOUNTER — Other Ambulatory Visit: Payer: Self-pay

## 2023-10-14 ENCOUNTER — Encounter: Payer: Self-pay | Admitting: Physician Assistant

## 2023-10-14 ENCOUNTER — Ambulatory Visit: Attending: Physician Assistant | Admitting: Physician Assistant

## 2023-10-14 ENCOUNTER — Other Ambulatory Visit (HOSPITAL_COMMUNITY): Payer: Self-pay

## 2023-10-14 VITALS — BP 124/76 | HR 64 | Resp 16 | Ht 72.0 in

## 2023-10-14 DIAGNOSIS — E7800 Pure hypercholesterolemia, unspecified: Secondary | ICD-10-CM | POA: Insufficient documentation

## 2023-10-14 DIAGNOSIS — Z720 Tobacco use: Secondary | ICD-10-CM | POA: Insufficient documentation

## 2023-10-14 DIAGNOSIS — I214 Non-ST elevation (NSTEMI) myocardial infarction: Secondary | ICD-10-CM | POA: Diagnosis not present

## 2023-10-14 DIAGNOSIS — I5022 Chronic systolic (congestive) heart failure: Secondary | ICD-10-CM | POA: Insufficient documentation

## 2023-10-14 DIAGNOSIS — E785 Hyperlipidemia, unspecified: Secondary | ICD-10-CM | POA: Insufficient documentation

## 2023-10-14 DIAGNOSIS — E876 Hypokalemia: Secondary | ICD-10-CM | POA: Diagnosis not present

## 2023-10-14 DIAGNOSIS — I10 Essential (primary) hypertension: Secondary | ICD-10-CM | POA: Insufficient documentation

## 2023-10-14 MED ORDER — CLOPIDOGREL BISULFATE 75 MG PO TABS
75.0000 mg | ORAL_TABLET | Freq: Every day | ORAL | 2 refills | Status: DC
  Filled 2023-10-14 – 2023-12-03 (×2): qty 90, 90d supply, fill #0

## 2023-10-14 MED ORDER — METOPROLOL SUCCINATE ER 25 MG PO TB24
25.0000 mg | ORAL_TABLET | Freq: Every day | ORAL | 3 refills | Status: DC
  Filled 2023-10-14 (×2): qty 90, 90d supply, fill #0
  Filled 2024-01-07: qty 90, 90d supply, fill #1
  Filled 2024-04-03: qty 90, 90d supply, fill #2

## 2023-10-14 MED ORDER — ATORVASTATIN CALCIUM 40 MG PO TABS
40.0000 mg | ORAL_TABLET | Freq: Every day | ORAL | 1 refills | Status: AC
  Filled 2023-10-14 – 2023-12-04 (×3): qty 90, 90d supply, fill #0
  Filled 2024-03-01: qty 90, 90d supply, fill #1

## 2023-10-14 MED ORDER — NITROGLYCERIN 0.4 MG SL SUBL
0.4000 mg | SUBLINGUAL_TABLET | SUBLINGUAL | 1 refills | Status: AC | PRN
  Filled 2023-10-14: qty 25, 8d supply, fill #0
  Filled 2024-04-15: qty 25, 10d supply, fill #1
  Filled 2024-04-15: qty 25, 8d supply, fill #1

## 2023-10-14 NOTE — Patient Instructions (Signed)
 Medication Instructions:  Refills downstairs on first floor pharmacy  Lab Work: FASTING lipid panel and hepatic function panel  Follow-Up: At [facility], you and your health needs are our priority.  As part of our continuing mission to provide you with exceptional heart care, our providers are all part of one team.  This team includes your primary Cardiologist (physician) and Advanced Practice Providers or APPs (Physician Assistants and Nurse Practitioners) who all work together to provide you with the care you need, when you need it.  Your next appointment:   6 month(s)  Provider:   Lonni Cash, MD

## 2023-10-14 NOTE — Addendum Note (Signed)
 Addended by: KRASOWSKI, Ariz Terrones on: 10/14/2023 01:43 PM   Modules accepted: Orders

## 2023-10-15 ENCOUNTER — Encounter: Admitting: Physical Therapy

## 2023-10-16 NOTE — Progress Notes (Deleted)
 [facility] History and Physical   Primary Care Physician:  Theotis Haze ORN, NP   Reason for Procedure:  History of perirectal abscess, possible rectal stenosis and concern for Crohn's disease  Plan:    Colonoscopy     HPI: Corey Hale is a 58 y.o. male undergoing colonoscopy for investigation of history of perirectal abscess, possible rectal stenosis and concern for Crohn's disease.  Patient reports history of an abscess on his buttock that was surgically treated in 2019.  He is continue to have difficulty with the abscess including liquid drainage but denies fevers or chills.  Also described experiencing blood in his ejaculate approximately 2 months ago.  Denies abdominal pain.  Previously had anemia.  Denied melena or hematochezia.  Rectal examination at the time of his clinic visit in May 2025 showed a perirectal abscess and possible rectal stenosis.  Patient is currently on Plavix .  Last dose administered***   Past Medical History:  Diagnosis Date   Chronic back pain    Chronic sinusitis    nasal polyps, turbinate hypertrophy, deviated septum   Erectile dysfunction    Goiter    Herpes simplex    Hypercholesteremia    Hypertension    Perirectal abscess    Skin abscess    Thyroid  disease    Tobacco dependence     Past Surgical History:  Procedure Laterality Date   ARM HARDWARE REMOVAL Right    INCISION AND DRAINAGE PERIRECTAL ABSCESS N/A 07/22/2017   Procedure: IRRIGATION, DEBRIDEMENT AND DRAINAGE PERIRECTAL ABSCESS;  Surgeon: Tanda Locus, MD;  Location[facility] OR;  Service: General;  Laterality: N/A;   LEFT HEART CATH AND CORONARY ANGIOGRAPHY N/A 12/05/2022   Procedure: LEFT HEART CATH AND CORONARY ANGIOGRAPHY;  Surgeon: Darron Deatrice LABOR, MD;  Location: [facility] INVASIVE CV LAB;  Service: Cardiovascular;  Laterality: N/A;   NASAL SEPTOPLASTY W/ TURBINOPLASTY Bilateral 11/13/2019   Procedure: NASAL SEPTOPLASTY WITH TURBINATE REDUCTION;  Surgeon: Carlie Clark, MD;   Location: [facility] OR;  Service: ENT;  Laterality: Bilateral;   NECK SURGERY     PAROTIDECTOMY     Dr Carlie   SINUS ENDO WITH FUSION Bilateral 11/13/2019   Procedure: SINUS ENDO WITH FUSION;  Surgeon: Carlie Clark, MD;  Location: [facility] OR;  Service: ENT;  Laterality: Bilateral;   THYROIDECTOMY, PARTIAL  2008   Dr Curvin    Prior to Admission medications   Medication Sig Start Date End Date Taking? Authorizing Provider  aspirin  81 MG tablet Take 1 tablet (81 mg total) by mouth daily. 03/07/15   Jegede, Olugbemiga E, MD  atorvastatin  (LIPITOR) 40 MG tablet Take 1 tablet (40 mg total) by mouth daily. 10/14/23   Lucien Orren SAILOR, PA-C  clopidogrel  (PLAVIX ) 75 MG tablet Take 1 tablet (75 mg total) by mouth daily with breakfast. 10/14/23   Lucien Orren SAILOR, PA-C  famotidine  (PEPCID ) 40 MG tablet Take 1 tablet (40 mg total) by mouth daily. For acid reflux 07/29/23   Fleming, Zelda W, NP  gabapentin  (NEURONTIN ) 600 MG tablet Take 0.5-1 tablets (300-600 mg total) by mouth 3 (three) times daily. 07/16/23   Fleming, Zelda W, NP  metoprolol  succinate (TOPROL  XL) 25 MG 24 hr tablet Take 1 tablet (25 mg total) by mouth daily. 10/14/23   Lucien Orren SAILOR, PA-C  nitroGLYCERIN  (NITROSTAT ) 0.4 MG SL tablet Place 1 tablet (0.4 mg total) under the tongue every 5 (five) minutes as needed. 10/14/23   Lucien Orren SAILOR, PA-C  Oxycodone  HCl 20 MG TABS Take 1  tablet (20 mg total) by mouth 4 (four) times daily as needed 08/30/23     traZODone  (DESYREL ) 100 MG tablet Take 1 tablet (100 mg total) by mouth at bedtime. FOR SLEEP 09/16/23   Newlin, Enobong, MD  valACYclovir  (VALTREX ) 1000 MG tablet Take 1 tablet (1,000 mg total) by mouth daily. 08/20/23   Newlin, Enobong, MD    Current Outpatient Medications  Medication Sig Dispense Refill   aspirin  81 MG tablet Take 1 tablet (81 mg total) by mouth daily. 90 tablet 3   atorvastatin  (LIPITOR) 40 MG tablet Take 1 tablet (40 mg total) by mouth daily. 90 tablet 1   clopidogrel  (PLAVIX ) 75 MG tablet  Take 1 tablet (75 mg total) by mouth daily with breakfast. 90 tablet 2   famotidine  (PEPCID ) 40 MG tablet Take 1 tablet (40 mg total) by mouth daily. For acid reflux 90 tablet 1   gabapentin  (NEURONTIN ) 600 MG tablet Take 0.5-1 tablets (300-600 mg total) by mouth 3 (three) times daily. 270 tablet 1   metoprolol  succinate (TOPROL  XL) 25 MG 24 hr tablet Take 1 tablet (25 mg total) by mouth daily. 90 tablet 3   nitroGLYCERIN  (NITROSTAT ) 0.4 MG SL tablet Place 1 tablet (0.4 mg total) under the tongue every 5 (five) minutes as needed. 25 tablet 1   Oxycodone  HCl 20 MG TABS Take 1 tablet (20 mg total) by mouth 4 (four) times daily as needed 120 tablet 0   traZODone  (DESYREL ) 100 MG tablet Take 1 tablet (100 mg total) by mouth at bedtime. FOR SLEEP 90 tablet 1   valACYclovir  (VALTREX ) 1000 MG tablet Take 1 tablet (1,000 mg total) by mouth daily. 90 tablet 3   No current facility-administered medications for this visit.    Allergies as of 10/17/2023 - Review Complete 10/14/2023  Allergen Reaction Noted   Pork-derived products  11/11/2019    Family History  Problem Relation Age of Onset   Stroke Mother    Stroke Father    Colon cancer Neg Hx    Stomach cancer Neg Hx    Esophageal cancer Neg Hx     Social History   Socioeconomic History   Marital status: Married    Spouse name: Not on file   Number of children: 0   Years of education: Not on file   Highest education level: Not on file  Occupational History   Not on file  Tobacco Use   Smoking status: Former    Current packs/day: 3.00    Average packs/day: 3.0 packs/day for 22.0 years (66.0 ttl pk-yrs)    Types: Cigarettes   Smokeless tobacco: Never   Tobacco comments:    3 to 4 cigs a day  Vaping Use   Vaping status: Never Used  Substance and Sexual Activity   Alcohol use: No    Alcohol/week: 0.0 standard drinks of alcohol   Drug use: No   Sexual activity: Yes  Other Topics Concern   Not on file  Social History Narrative    ** Merged History Encounter **       Social Drivers of Health   Financial Resource Strain: Patient Declined (04/15/2023)   Overall Financial Resource Strain (CARDIA)    Difficulty of Paying Living Expenses: Patient declined  Food Insecurity: No Food Insecurity (12/05/2022)   Hunger Vital Sign    Worried About Running Out of Food in the Last Year: Never true    Ran Out of Food in the Last Year: Never true  Transportation Needs:  No Transportation Needs (12/05/2022)   PRAPARE - Administrator, Civil Service (Medical): No    Lack of Transportation (Non-Medical): No  Physical Activity: Patient Declined (04/15/2023)   Exercise Vital Sign    Days of Exercise per Week: Patient declined    Minutes of Exercise per Session: Patient declined  Stress: No Stress Concern Present (04/15/2023)   [employer] of Occupational Health - Occupational Stress Questionnaire    Feeling of Stress : Not at all  Social Connections: Socially Integrated (04/15/2023)   Social Connection and Isolation Panel    Frequency of Communication with Friends and Family: More than three times a week    Frequency of Social Gatherings with Friends and Family: More than three times a week    Attends Religious Services: 1 to 4 times per year    Active Member of [employer] or Organizations: No    Attends Banker Meetings: 1 to 4 times per year    Marital Status: Married  Catering manager Violence: Not At Risk (12/05/2022)   Humiliation, Afraid, Rape, and Kick questionnaire    Fear of Current or Ex-Partner: No    Emotionally Abused: No    Physically Abused: No    Sexually Abused: No    Review of Systems:  All other review of systems negative except as mentioned in the HPI.  Physical Exam: Vital signs There were no vitals taken for this visit.  General:   Alert,  Well-developed, well-nourished, pleasant and cooperative in NAD Airway:  Mallampati  Lungs:  Clear throughout to auscultation.   Heart:   Regular rate and rhythm; no murmurs, clicks, rubs,  or gallops. Abdomen:  Soft, nontender and nondistended. Normal bowel sounds.   Neuro/Psych:  Normal mood and affect. A and O x 3  Inocente Hausen, MD [facility]

## 2023-10-17 ENCOUNTER — Encounter: Admitting: Pediatrics

## 2023-10-17 ENCOUNTER — Telehealth: Payer: Self-pay | Admitting: Pediatrics

## 2023-10-17 NOTE — Telephone Encounter (Signed)
 Good afternoon Dr. Suzann this patient is called and stated that he had to reschedule his procedure due to him taken is Asprin today. Patient was rescheduled for August the 11 th. Please advise.

## 2023-10-23 ENCOUNTER — Encounter: Payer: Self-pay | Admitting: Pediatrics

## 2023-10-25 ENCOUNTER — Other Ambulatory Visit (HOSPITAL_COMMUNITY): Payer: Self-pay

## 2023-10-25 DIAGNOSIS — R030 Elevated blood-pressure reading, without diagnosis of hypertension: Secondary | ICD-10-CM | POA: Diagnosis not present

## 2023-10-25 DIAGNOSIS — I10 Essential (primary) hypertension: Secondary | ICD-10-CM | POA: Diagnosis not present

## 2023-10-25 DIAGNOSIS — Z122 Encounter for screening for malignant neoplasm of respiratory organs: Secondary | ICD-10-CM | POA: Diagnosis not present

## 2023-10-25 DIAGNOSIS — M5030 Other cervical disc degeneration, unspecified cervical region: Secondary | ICD-10-CM | POA: Diagnosis not present

## 2023-10-25 DIAGNOSIS — Z79899 Other long term (current) drug therapy: Secondary | ICD-10-CM | POA: Diagnosis not present

## 2023-10-25 MED ORDER — PREGABALIN 150 MG PO CAPS
150.0000 mg | ORAL_CAPSULE | Freq: Two times a day (BID) | ORAL | 0 refills | Status: DC
  Filled 2023-10-25: qty 60, 30d supply, fill #0

## 2023-10-25 MED ORDER — OXYCODONE HCL 20 MG PO TABS
20.0000 mg | ORAL_TABLET | ORAL | 0 refills | Status: DC | PRN
  Filled 2023-10-25: qty 180, 30d supply, fill #0

## 2023-10-25 NOTE — Telephone Encounter (Signed)
 Returned the patient's phone call. He has not had any Chest pain recently and he has held his Plavix  since 10/23/23. Discussed prep instructions with the patient and he verbalized understanding.

## 2023-10-25 NOTE — Telephone Encounter (Signed)
 Patient is requesting a call to discuss prep instruction questions he has for colonoscopy on 8/11. Also stated he will be come in at 1 pm instead. Please advise, thank you

## 2023-10-27 NOTE — Progress Notes (Unsigned)
 [facility] History and Physical   Primary Care Physician:  Theotis Haze ORN, NP   Reason for Procedure:  History of perirectal abscess, concern for possible inflammatory bowel disease  Plan:    Colonoscopy     HPI: Corey Hale is a 58 y.o. male undergoing colonoscopy for investigation of history of perirectal abscess and concern for possible inflammatory bowel disease.  Patient has a history of recurrent perianal abscess documented in 2014, 2018, 2019 and most recently in 2025.  Previously seen by Dr. Tanda in 2019 for debridement and drainage of perirectal abscess.  Has had issues on and off over the years with ongoing drainage.  At the time of most recent GI clinic visit perianal exam showed evidence of induration and concern for recurrent abscess.  CTAP showed scarring or less likely a residual tract in the medial left buttock but no active abscess.  There was no acute intra-abdominal or pelvic pathology.  Patient has never had a prior colonoscopy.  Patient is currently on Plavix  and aspirin  for history of NSTEMI 11/2022 -last dose of Plavix  administered***   Past Medical History:  Diagnosis Date   CAD (coronary artery disease)    Chronic back pain    Chronic sinusitis    nasal polyps, turbinate hypertrophy, deviated septum   Erectile dysfunction    Goiter    Herpes simplex    Hypercholesteremia    Hypertension    MI (myocardial infarction) (HCC)    Perirectal abscess    Skin abscess    Thyroid  disease    Tobacco dependence     Past Surgical History:  Procedure Laterality Date   ARM HARDWARE REMOVAL Right    INCISION AND DRAINAGE PERIRECTAL ABSCESS N/A 07/22/2017   Procedure: IRRIGATION, DEBRIDEMENT AND DRAINAGE PERIRECTAL ABSCESS;  Surgeon: Tanda Locus, MD;  Location: [facility] OR;  Service: General;  Laterality: N/A;   LEFT HEART CATH AND CORONARY ANGIOGRAPHY N/A 12/05/2022   Procedure: LEFT HEART CATH AND CORONARY ANGIOGRAPHY;  Surgeon: Darron Deatrice LABOR, MD;   Location: [facility] INVASIVE CV LAB;  Service: Cardiovascular;  Laterality: N/A;   NASAL SEPTOPLASTY W/ TURBINOPLASTY Bilateral 11/13/2019   Procedure: NASAL SEPTOPLASTY WITH TURBINATE REDUCTION;  Surgeon: Carlie Clark, MD;  Location: [facility] OR;  Service: ENT;  Laterality: Bilateral;   NECK SURGERY     PAROTIDECTOMY     Dr Carlie   SINUS ENDO WITH FUSION Bilateral 11/13/2019   Procedure: SINUS ENDO WITH FUSION;  Surgeon: Carlie Clark, MD;  Location: [facility] OR;  Service: ENT;  Laterality: Bilateral;   THYROIDECTOMY, PARTIAL  2008   Dr Curvin    Prior to Admission medications   Medication Sig Start Date End Date Taking? Authorizing Provider  aspirin  81 MG tablet Take 1 tablet (81 mg total) by mouth daily. 03/07/15   Jegede, Olugbemiga E, MD  atorvastatin  (LIPITOR) 40 MG tablet Take 1 tablet (40 mg total) by mouth daily. 10/14/23   Lucien Orren SAILOR, PA-C  clopidogrel  (PLAVIX ) 75 MG tablet Take 1 tablet (75 mg total) by mouth daily with breakfast. 10/14/23   Lucien Orren SAILOR, PA-C  famotidine  (PEPCID ) 40 MG tablet Take 1 tablet (40 mg total) by mouth daily. For acid reflux 07/29/23   Fleming, Zelda W, NP  gabapentin  (NEURONTIN ) 600 MG tablet Take 0.5-1 tablets (300-600 mg total) by mouth 3 (three) times daily. 07/16/23   Fleming, Zelda W, NP  metoprolol  succinate (TOPROL  XL) 25 MG 24 hr tablet Take 1 tablet (25 mg total) by mouth daily. 10/14/23  Lucien Orren SAILOR, PA-C  nitroGLYCERIN  (NITROSTAT ) 0.4 MG SL tablet Place 1 tablet (0.4 mg total) under the tongue every 5 (five) minutes as needed. 10/14/23   Lucien Orren SAILOR, PA-C  Oxycodone  HCl 20 MG TABS Take 1 tablet (20 mg total) by mouth 4 (four) times daily as needed 08/30/23     Oxycodone  HCl 20 MG TABS Take 1 tablet (20 mg total) by mouth every 4 (four) hours as needed. 10/25/23     pregabalin  (LYRICA ) 150 MG capsule Take 1 capsule (150 mg total) by mouth 2 (two) times daily. 10/25/23     traZODone  (DESYREL ) 100 MG tablet Take 1 tablet (100 mg total) by mouth at bedtime. FOR  SLEEP 09/16/23   Newlin, Enobong, MD  valACYclovir  (VALTREX ) 1000 MG tablet Take 1 tablet (1,000 mg total) by mouth daily. 08/20/23   Newlin, Enobong, MD    Current Outpatient Medications  Medication Sig Dispense Refill   aspirin  81 MG tablet Take 1 tablet (81 mg total) by mouth daily. 90 tablet 3   atorvastatin  (LIPITOR) 40 MG tablet Take 1 tablet (40 mg total) by mouth daily. 90 tablet 1   clopidogrel  (PLAVIX ) 75 MG tablet Take 1 tablet (75 mg total) by mouth daily with breakfast. 90 tablet 2   famotidine  (PEPCID ) 40 MG tablet Take 1 tablet (40 mg total) by mouth daily. For acid reflux 90 tablet 1   gabapentin  (NEURONTIN ) 600 MG tablet Take 0.5-1 tablets (300-600 mg total) by mouth 3 (three) times daily. 270 tablet 1   metoprolol  succinate (TOPROL  XL) 25 MG 24 hr tablet Take 1 tablet (25 mg total) by mouth daily. 90 tablet 3   nitroGLYCERIN  (NITROSTAT ) 0.4 MG SL tablet Place 1 tablet (0.4 mg total) under the tongue every 5 (five) minutes as needed. 25 tablet 1   Oxycodone  HCl 20 MG TABS Take 1 tablet (20 mg total) by mouth 4 (four) times daily as needed 120 tablet 0   Oxycodone  HCl 20 MG TABS Take 1 tablet (20 mg total) by mouth every 4 (four) hours as needed. 180 tablet 0   pregabalin  (LYRICA ) 150 MG capsule Take 1 capsule (150 mg total) by mouth 2 (two) times daily. 60 capsule 0   traZODone  (DESYREL ) 100 MG tablet Take 1 tablet (100 mg total) by mouth at bedtime. FOR SLEEP 90 tablet 1   valACYclovir  (VALTREX ) 1000 MG tablet Take 1 tablet (1,000 mg total) by mouth daily. 90 tablet 3   No current facility-administered medications for this visit.    Allergies as of 10/28/2023 - Review Complete 10/14/2023  Allergen Reaction Noted   Pork-derived products  11/11/2019    Family History  Problem Relation Age of Onset   Stroke Mother    Stroke Father    Colon cancer Neg Hx    Stomach cancer Neg Hx    Esophageal cancer Neg Hx     Social History   Socioeconomic History   Marital status:  Married    Spouse name: Not on file   Number of children: 0   Years of education: Not on file   Highest education level: Not on file  Occupational History   Not on file  Tobacco Use   Smoking status: Former    Current packs/day: 3.00    Average packs/day: 3.0 packs/day for 22.0 years (66.0 ttl pk-yrs)    Types: Cigarettes   Smokeless tobacco: Never   Tobacco comments:    3 to 4 cigs a day  Vaping Use  Vaping status: Never Used  Substance and Sexual Activity   Alcohol use: No    Alcohol/week: 0.0 standard drinks of alcohol   Drug use: No   Sexual activity: Yes  Other Topics Concern   Not on file  Social History Narrative   ** Merged History Encounter **       Social Drivers of Health   Financial Resource Strain: Patient Declined (04/15/2023)   Overall Financial Resource Strain (CARDIA)    Difficulty of Paying Living Expenses: Patient declined  Food Insecurity: No Food Insecurity (12/05/2022)   Hunger Vital Sign    Worried About Running Out of Food in the Last Year: Never true    Ran Out of Food in the Last Year: Never true  Transportation Needs: No Transportation Needs (12/05/2022)   PRAPARE - Administrator, Civil Service (Medical): No    Lack of Transportation (Non-Medical): No  Physical Activity: Patient Declined (04/15/2023)   Exercise Vital Sign    Days of Exercise per Week: Patient declined    Minutes of Exercise per Session: Patient declined  Stress: No Stress Concern Present (04/15/2023)   [employer] of Occupational Health - Occupational Stress Questionnaire    Feeling of Stress : Not at all  Social Connections: Socially Integrated (04/15/2023)   Social Connection and Isolation Panel    Frequency of Communication with Friends and Family: More than three times a week    Frequency of Social Gatherings with Friends and Family: More than three times a week    Attends Religious Services: 1 to 4 times per year    Active Member of [employer] or  Organizations: No    Attends Banker Meetings: 1 to 4 times per year    Marital Status: Married  Catering manager Violence: Not At Risk (12/05/2022)   Humiliation, Afraid, Rape, and Kick questionnaire    Fear of Current or Ex-Partner: No    Emotionally Abused: No    Physically Abused: No    Sexually Abused: No    Review of Systems:  All other review of systems negative except as mentioned in the HPI.  Physical Exam: Vital signs There were no vitals taken for this visit.  General:   Alert,  Well-developed, well-nourished, pleasant and cooperative in NAD Airway:  Mallampati  Lungs:  Clear throughout to auscultation.   Heart:  Regular rate and rhythm; no murmurs, clicks, rubs,  or gallops. Abdomen:  Soft, nontender and nondistended. Normal bowel sounds.   Neuro/Psych:  Normal mood and affect. A and O x 3  Inocente Hausen, MD [facility]

## 2023-10-28 ENCOUNTER — Encounter: Payer: Self-pay | Admitting: Pediatrics

## 2023-10-28 ENCOUNTER — Ambulatory Visit: Admitting: Pediatrics

## 2023-10-28 VITALS — BP 110/70 | HR 64 | Temp 98.0°F | Resp 12 | Ht 72.0 in | Wt 199.0 lb

## 2023-10-28 DIAGNOSIS — K648 Other hemorrhoids: Secondary | ICD-10-CM

## 2023-10-28 DIAGNOSIS — D125 Benign neoplasm of sigmoid colon: Secondary | ICD-10-CM

## 2023-10-28 DIAGNOSIS — D649 Anemia, unspecified: Secondary | ICD-10-CM | POA: Diagnosis not present

## 2023-10-28 DIAGNOSIS — E7800 Pure hypercholesterolemia, unspecified: Secondary | ICD-10-CM | POA: Diagnosis not present

## 2023-10-28 DIAGNOSIS — I2510 Atherosclerotic heart disease of native coronary artery without angina pectoris: Secondary | ICD-10-CM | POA: Diagnosis not present

## 2023-10-28 DIAGNOSIS — I252 Old myocardial infarction: Secondary | ICD-10-CM | POA: Diagnosis not present

## 2023-10-28 DIAGNOSIS — I10 Essential (primary) hypertension: Secondary | ICD-10-CM | POA: Diagnosis not present

## 2023-10-28 DIAGNOSIS — K603 Anal fistula, unspecified: Secondary | ICD-10-CM

## 2023-10-28 MED ORDER — SODIUM CHLORIDE 0.9 % IV SOLN: 500.0000 mL | Freq: Once | INTRAVENOUS | Status: DC

## 2023-10-28 NOTE — Patient Instructions (Addendum)
 Resume previous diet Continue present medications, resume plavix  at usual dose TOMORROW, TUESDAY,  10/29/23 Await pathology results  Handouts/information given for polyps and hemorrhoids  YOU HAD AN ENDOSCOPIC PROCEDURE TODAY AT THE [facility]:   Refer to the procedure report that was given to you for any specific questions about what was found during the examination.  If the procedure report does not answer your questions, please call your gastroenterologist to clarify.  If you requested that your care partner not be given the details of your procedure findings, then the procedure report has been included in a sealed envelope for you to review at your convenience later.  YOU SHOULD EXPECT: Some feelings of bloating in the abdomen. Passage of more gas than usual.  Walking can help get rid of the air that was put into your GI tract during the procedure and reduce the bloating. If you had a lower endoscopy (such as a colonoscopy or flexible sigmoidoscopy) you may notice spotting of blood in your stool or on the toilet paper. If you underwent a bowel prep for your procedure, you may not have a normal bowel movement for a few days.  Please Note:  You might notice some irritation and congestion in your nose or some drainage.  This is from the oxygen used during your procedure.  There is no need for concern and it should clear up in a day or so.  SYMPTOMS TO REPORT IMMEDIATELY:  Following lower endoscopy (colonoscopy):  Excessive amounts of blood in the stool  Significant tenderness or worsening of abdominal pains  Swelling of the abdomen that is new, acute  Fever of 100F or higher  For urgent or emergent issues, a gastroenterologist can be reached at any hour by calling [phone]. Do not use MyChart messaging for urgent concerns.   DIET:  We do recommend a small meal at first, but then you may proceed to your regular diet.  Drink plenty of fluids but you should avoid alcoholic  beverages for 24 hours.  ACTIVITY:  You should plan to take it easy for the rest of today and you should NOT DRIVE or use heavy machinery until tomorrow (because of the sedation medicines used during the test).    FOLLOW UP: Our staff will call the number listed on your records the next business day following your procedure.  We will call around 7:15- 8:00 am to check on you and address any questions or concerns that you may have regarding the information given to you following your procedure. If we do not reach you, we will leave a message.     If any biopsies were taken you will be contacted by phone or by letter within the next 1-3 weeks.  Please call us  at [phone] if you have not heard about the biopsies in 3 weeks.   SIGNATURES/CONFIDENTIALITY: You and/or your care partner have signed paperwork which will be entered into your electronic medical record.  These signatures attest to the fact that that the information above on your After Visit Summary has been reviewed and is understood.  Full responsibility of the confidentiality of this discharge information lies with you and/or your care-partner.

## 2023-10-28 NOTE — Progress Notes (Signed)
 Called to room to assist during endoscopic procedure.  Patient ID and intended procedure confirmed with present staff. Received instructions for my participation in the procedure from the performing physician.

## 2023-10-28 NOTE — Progress Notes (Signed)
 Report given to PACU, vss

## 2023-10-28 NOTE — Progress Notes (Signed)
 1420 Robinul 0.1 mg IV given due large amount of secretions upon assessment.  Patient experiencing nausea.  MD updated and Zofran  4 mg IV given, vss

## 2023-10-28 NOTE — Op Note (Addendum)
 [facility] Patient Name: Corey Hale Procedure Date: 10/28/2023 2:15 PM MRN: [id number] Endoscopist: Inocente Hausen , MD, [id number] Age: 58 Referring MD:  Date of Birth: [birth year] Gender: Male Account #: [id number] Procedure:                Colonoscopy Indications:              Anorectal fistula with persistent drainage,                            Evaluate for possible Crohn's disease, No previous                            colonoscopy Medicines:                Monitored Anesthesia Care Procedure:                Pre-Anesthesia Assessment:                           - Prior to the procedure, a History and Physical                            was performed, and patient medications and                            allergies were reviewed. The patient's tolerance of                            previous anesthesia was also reviewed. The risks                            and benefits of the procedure and the sedation                            options and risks were discussed with the patient.                            All questions were answered, and informed consent                            was obtained. Prior Anticoagulants: The patient has                            taken Plavix  (clopidogrel ), last dose was 5 days                            prior to procedure. After reviewing the risks and                            benefits, the patient was deemed in satisfactory                            condition to undergo the procedure.  After obtaining informed consent, the colonoscope                            was passed under direct vision. Throughout the                            procedure, the patient's blood pressure, pulse, and                            oxygen saturations were monitored continuously. The                            Olympus Scope SN: [id number] was introduced through                            the anus and advanced to the terminal ileum. The                             colonoscopy was performed without difficulty. The                            patient tolerated the procedure well. The quality                            of the bowel preparation was fair. The terminal                            ileum, ileocecal valve, appendiceal orifice, and                            rectum were photographed. Scope In: 2:50:57 PM Scope Out: 3:15:47 PM Scope Withdrawal Time: 0 hours 20 minutes 28 seconds  Total Procedure Duration: 0 hours 24 minutes 50 seconds  Findings:                 The perianal exam findings include: Scarring and                            induration with 3 fistulas present over the left                            buttock without fluctuance or abscess.                           The digital rectal exam was normal. Pertinent                            negatives include normal sphincter tone and no                            palpable rectal lesions.                           A moderate amount of semi-liquid stool was found in  the entire colon. Lavage of the area was performed                            using a large amount of sterile water, resulting in                            clearance with adequate visualization.                           Normal mucosa was found in the entire colon.                            Biopsies were taken with a cold forceps for                            histology.                           A 4 mm polyp was found in the sigmoid colon. The                            polyp was sessile. The polyp was removed with a                            cold biopsy forceps. Resection and retrieval were                            complete.                           The terminal ileum appeared normal. Biopsies were                            taken with a cold forceps for histology.                           Internal hemorrhoids were found during retroflexion. Complications:             No immediate complications. Estimated blood loss:                            Minimal. Estimated Blood Loss:     Estimated blood loss was minimal. Impression:               - Preparation of the colon was fair.                           - Scarring and induration with 2 fistulas present                            over the left buttock without fluctuance or abscess                            found on perianal exam.                           -  Stool in the entire examined colon.                           - Normal mucosa in the entire examined colon.                            Biopsied.                           - One 4 mm polyp in the sigmoid colon, removed with                            a cold biopsy forceps. Resected and retrieved.                           - The examined portion of the ileum was normal.                            Biopsied.                           - Internal hemorrhoids.                           - There was no endoscopic evidence of luminal                            Crohn's disease. Recommendation:           - Discharge patient to home (ambulatory).                           - Await pathology results. Pending pathology                            results to make a determination if there is a need                            for patient to be evaluated by colorectal surgery.                           - Repeat colonoscopy for surveillance purposes                            based upon pathology. Bowel prep today was fair for                            the purposes of colon polyp screening. If pathology                            is benign consider repeat colonoscopy in 5 years                            with 2-day bowel prep.                           -  The findings and recommendations were discussed                            with the patient.                           - Patient has a contact number available for                            emergencies. The signs and  symptoms of potential                            delayed complications were discussed with the                            patient. Return to normal activities tomorrow.                            Written discharge instructions were provided to the                            patient. Inocente Hausen, MD 10/28/2023 3:26:08 PM This report has been signed electronically. 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

## 2023-10-29 ENCOUNTER — Telehealth: Payer: Self-pay

## 2023-10-29 NOTE — Telephone Encounter (Signed)
 No answer, left message to call if having any issues or concerns, B.Vester Titsworth RN

## 2023-10-31 ENCOUNTER — Ambulatory Visit: Payer: Self-pay | Admitting: Pediatrics

## 2023-10-31 LAB — SURGICAL PATHOLOGY

## 2023-11-05 NOTE — Telephone Encounter (Signed)
 Patient returning phone call. Patient has read Mychart message. Patient was unsure what he was being referred for but was clarified. Please advise, thank you

## 2023-11-22 ENCOUNTER — Other Ambulatory Visit (HOSPITAL_COMMUNITY): Payer: Self-pay

## 2023-11-22 DIAGNOSIS — Z79899 Other long term (current) drug therapy: Secondary | ICD-10-CM | POA: Diagnosis not present

## 2023-11-22 DIAGNOSIS — I10 Essential (primary) hypertension: Secondary | ICD-10-CM | POA: Diagnosis not present

## 2023-11-22 DIAGNOSIS — M5030 Other cervical disc degeneration, unspecified cervical region: Secondary | ICD-10-CM | POA: Diagnosis not present

## 2023-11-22 MED ORDER — OXYCODONE HCL 20 MG PO TABS
20.0000 mg | ORAL_TABLET | ORAL | 0 refills | Status: DC | PRN
  Filled 2023-11-22: qty 180, 30d supply, fill #0

## 2023-11-22 MED ORDER — PREGABALIN 150 MG PO CAPS
150.0000 mg | ORAL_CAPSULE | Freq: Two times a day (BID) | ORAL | 0 refills | Status: DC
  Filled 2023-11-22: qty 60, 30d supply, fill #0

## 2023-11-27 ENCOUNTER — Other Ambulatory Visit: Payer: Self-pay

## 2023-12-02 ENCOUNTER — Telehealth: Payer: Self-pay | Admitting: Nurse Practitioner

## 2023-12-02 NOTE — Telephone Encounter (Signed)
 First attempt contacted pt to resch appt 10/29 pcp not in the office

## 2023-12-03 ENCOUNTER — Other Ambulatory Visit: Payer: Self-pay

## 2023-12-04 ENCOUNTER — Other Ambulatory Visit (HOSPITAL_COMMUNITY): Payer: Self-pay

## 2023-12-04 ENCOUNTER — Other Ambulatory Visit: Payer: Self-pay

## 2023-12-05 ENCOUNTER — Other Ambulatory Visit: Payer: Self-pay

## 2023-12-14 ENCOUNTER — Other Ambulatory Visit (HOSPITAL_COMMUNITY): Payer: Self-pay

## 2023-12-20 ENCOUNTER — Other Ambulatory Visit (HOSPITAL_COMMUNITY): Payer: Self-pay

## 2023-12-20 DIAGNOSIS — Z79899 Other long term (current) drug therapy: Secondary | ICD-10-CM | POA: Diagnosis not present

## 2023-12-20 DIAGNOSIS — M5030 Other cervical disc degeneration, unspecified cervical region: Secondary | ICD-10-CM | POA: Diagnosis not present

## 2023-12-20 DIAGNOSIS — Z6825 Body mass index (BMI) 25.0-25.9, adult: Secondary | ICD-10-CM | POA: Diagnosis not present

## 2023-12-20 MED ORDER — PREGABALIN 150 MG PO CAPS
150.0000 mg | ORAL_CAPSULE | Freq: Two times a day (BID) | ORAL | 0 refills | Status: DC
  Filled 2023-12-20: qty 60, 30d supply, fill #0

## 2023-12-20 MED ORDER — OXYCODONE HCL 20 MG PO TABS
20.0000 mg | ORAL_TABLET | ORAL | 0 refills | Status: DC | PRN
  Filled 2023-12-20: qty 180, 30d supply, fill #0

## 2023-12-24 ENCOUNTER — Telehealth: Payer: Self-pay | Admitting: Pediatrics

## 2023-12-24 NOTE — Telephone Encounter (Signed)
 Called [facility] to follow up on referral that was placed on 11/05/23. I was informed that referral was not received. I called and informed patient. I let patient know that we will be placing a new referral today and [facility] should contact him within a couple of weeks to schedule his appt. Patient verbalized understanding and had no concerns at the end of the call.   Ambulatory referral to General Surgery - [facility] entered in [facility].

## 2023-12-24 NOTE — Telephone Encounter (Signed)
 Inbound call from patient stating that we sent over a referral for him to [facility] and he has not heard anything from them. Patient is requesting a call back to discuss. Please advise.

## 2023-12-30 ENCOUNTER — Other Ambulatory Visit: Payer: Self-pay

## 2024-01-01 ENCOUNTER — Other Ambulatory Visit (HOSPITAL_COMMUNITY): Payer: Self-pay

## 2024-01-06 ENCOUNTER — Telehealth: Payer: Self-pay | Admitting: Cardiovascular Disease

## 2024-01-06 NOTE — Telephone Encounter (Signed)
 Appt scheduled on 01/21/2024 @ 9:10 AM with Dr. Lonni Pizza

## 2024-01-06 NOTE — Telephone Encounter (Signed)
 Pt c/o medication issue:  1. Name of Medication:   clopidogrel  (PLAVIX ) 75 MG tablet    2. How are you currently taking this medication (dosage and times per day)? As written  3. Are you having a reaction (difficulty breathing--STAT)? no  4. What is your medication issue? Pt wants to know when he can discontinue this medication

## 2024-01-07 NOTE — Telephone Encounter (Signed)
 Verlin Lonni BIRCH, MD to Me     01/07/24 12:04 PM He is over a year out from his NSTEMI. Can stop Plavix  now and continue ASA 81 mg daily. Corey Hale  Patient notified.

## 2024-01-09 ENCOUNTER — Other Ambulatory Visit: Payer: Self-pay

## 2024-01-15 ENCOUNTER — Ambulatory Visit: Admitting: Nurse Practitioner

## 2024-01-15 ENCOUNTER — Other Ambulatory Visit (HOSPITAL_COMMUNITY): Payer: Self-pay

## 2024-01-15 DIAGNOSIS — Z79899 Other long term (current) drug therapy: Secondary | ICD-10-CM | POA: Diagnosis not present

## 2024-01-15 MED ORDER — PREGABALIN 150 MG PO CAPS
150.0000 mg | ORAL_CAPSULE | Freq: Two times a day (BID) | ORAL | 2 refills | Status: AC
  Filled 2024-01-15: qty 60, 30d supply, fill #0

## 2024-01-15 MED ORDER — OXYCODONE HCL 20 MG PO TABS
20.0000 mg | ORAL_TABLET | ORAL | 0 refills | Status: DC | PRN
  Filled 2024-01-15: qty 180, 30d supply, fill #0

## 2024-01-16 ENCOUNTER — Other Ambulatory Visit (HOSPITAL_COMMUNITY): Payer: Self-pay

## 2024-01-17 ENCOUNTER — Other Ambulatory Visit (HOSPITAL_COMMUNITY): Payer: Self-pay

## 2024-01-20 DIAGNOSIS — Z79899 Other long term (current) drug therapy: Secondary | ICD-10-CM | POA: Diagnosis not present

## 2024-01-21 ENCOUNTER — Encounter: Payer: Self-pay | Admitting: Nurse Practitioner

## 2024-01-21 ENCOUNTER — Other Ambulatory Visit: Payer: Self-pay

## 2024-01-21 ENCOUNTER — Ambulatory Visit: Attending: Nurse Practitioner | Admitting: Nurse Practitioner

## 2024-01-21 VITALS — BP 110/76 | HR 67 | Resp 18 | Ht 72.0 in | Wt 193.6 lb

## 2024-01-21 DIAGNOSIS — K60311 Anal fistula, simple, initial: Secondary | ICD-10-CM | POA: Diagnosis not present

## 2024-01-21 DIAGNOSIS — K219 Gastro-esophageal reflux disease without esophagitis: Secondary | ICD-10-CM | POA: Diagnosis not present

## 2024-01-21 DIAGNOSIS — H6121 Impacted cerumen, right ear: Secondary | ICD-10-CM | POA: Diagnosis not present

## 2024-01-21 DIAGNOSIS — R7303 Prediabetes: Secondary | ICD-10-CM | POA: Diagnosis not present

## 2024-01-21 MED ORDER — FAMOTIDINE 40 MG PO TABS
40.0000 mg | ORAL_TABLET | Freq: Every day | ORAL | 1 refills | Status: AC
  Filled 2024-01-21: qty 90, 90d supply, fill #0

## 2024-01-21 MED ORDER — DEBROX 6.5 % OT SOLN
5.0000 [drp] | Freq: Two times a day (BID) | OTIC | 3 refills | Status: AC
  Filled 2024-01-21: qty 15, 30d supply, fill #0

## 2024-01-21 NOTE — Progress Notes (Signed)
 Patient identified by name and date of birth.  Patient aware of provider response and voiced understanding.

## 2024-01-21 NOTE — Progress Notes (Signed)
 Assessment & Plan:  Corey Hale was seen today for hearing loss.  Diagnoses and all orders for this visit:  Impacted cerumen of right ear -     carbamide peroxide (DEBROX) 6.5 % OTIC solution; Place 5 drops into the right ear 2 (two) times daily. Hearing loss in both ears with pending approval for a hearing aid. - Contact audiology services to inquire about hearing aid approval. - If unable to contact, place another referral for audiology services to call him.  Prediabetes -     Hemoglobin A1c -     CMP14+EGFR  GERD without esophagitis Symptoms controlled -     famotidine  (PEPCID ) 40 MG tablet; Take 1 tablet (40 mg total) by mouth daily. For acid reflux INSTRUCTIONS: Avoid GERD Triggers: acidic, spicy or fried foods, caffeine, coffee, sodas,  alcohol and chocolate.    Foot pain Intermittent plantar foot pain likely due to tight tendons and ligaments. - Provide stretching exercises for plantar fasciitis.  Patient has been counseled on age-appropriate routine health concerns for screening and prevention. These are reviewed and up-to-date. Referrals have been placed accordingly. Immunizations are up-to-date or declined.    Subjective:   Chief Complaint  Patient presents with   Hearing Loss    Corey Hale 58 y.o. 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   Although he has hearing loss of both ears, the right ear is impacted with cerumen. We were able to completely irrigate the ear today and remove the cerumen.   He experiences intermittent pain at the bottom of both feet, which he describes as occurring 'sometimes here, sometimes there.' The pain is particularly noticeable when he gets up from bed, especially when going to the bathroom in the mornings or at night after sitting or lying down for a while. The pain is currently  not present but has been an issue in the past. He has not mentioned any specific treatments he has tried for this issue.    Review of Systems  Constitutional:  Negative for fever, malaise/fatigue and weight loss.  HENT:  Positive for hearing loss. Negative for nosebleeds.   Respiratory: Negative.  Negative for cough and shortness of breath.   Cardiovascular: Negative.  Negative for chest pain, palpitations and leg swelling.  Gastrointestinal:  Negative for heartburn, nausea and vomiting.  Musculoskeletal:  Negative for myalgias.       SEE HPI  Neurological: Negative.  Negative for dizziness, focal weakness, seizures and headaches.  Psychiatric/Behavioral: Negative.  Negative for suicidal ideas.     Past Medical History:  Diagnosis Date   CAD (coronary artery disease)    Chronic back pain    Chronic sinusitis    nasal polyps, turbinate hypertrophy, deviated septum   Erectile dysfunction    Goiter    Herpes simplex    Hypercholesteremia    Hypertension    MI (myocardial infarction) (HCC)    Perirectal abscess    Skin abscess    Thyroid  disease    Tobacco dependence     Past Surgical History:  Procedure Laterality Date   ARM HARDWARE REMOVAL Right    INCISION AND DRAINAGE PERIRECTAL ABSCESS N/A 07/22/2017   Procedure: IRRIGATION, DEBRIDEMENT AND DRAINAGE PERIRECTAL ABSCESS;  Surgeon: Tanda Locus, MD;  Location: [facility] OR;  Service: General;  Laterality: N/A;   LEFT HEART CATH AND  CORONARY ANGIOGRAPHY N/A 12/05/2022   Procedure: LEFT HEART CATH AND CORONARY ANGIOGRAPHY;  Surgeon: Darron Deatrice LABOR, MD;  Location: [facility] INVASIVE CV LAB;  Service: Cardiovascular;  Laterality: N/A;   NASAL SEPTOPLASTY W/ TURBINOPLASTY Bilateral 11/13/2019   Procedure: NASAL SEPTOPLASTY WITH TURBINATE REDUCTION;  Surgeon: Carlie Clark, MD;  Location: [facility] OR;  Service: ENT;  Laterality: Bilateral;   NECK SURGERY     PAROTIDECTOMY     Dr Carlie   SINUS ENDO WITH FUSION Bilateral 11/13/2019   Procedure: SINUS  ENDO WITH FUSION;  Surgeon: Carlie Clark, MD;  Location: [facility] OR;  Service: ENT;  Laterality: Bilateral;   THYROIDECTOMY, PARTIAL  2008   Dr Curvin    Family History  Problem Relation Age of Onset   Stroke Mother    Stroke Father    Colon cancer Neg Hx    Stomach cancer Neg Hx    Esophageal cancer Neg Hx     Social History Reviewed with no changes to be made today.   Outpatient Medications Prior to Visit  Medication Sig Dispense Refill   aspirin  81 MG tablet Take 1 tablet (81 mg total) by mouth daily. 90 tablet 3   atorvastatin  (LIPITOR) 40 MG tablet Take 1 tablet (40 mg total) by mouth daily. 90 tablet 1   metoprolol  succinate (TOPROL  XL) 25 MG 24 hr tablet Take 1 tablet (25 mg total) by mouth daily. 90 tablet 3   nitroGLYCERIN  (NITROSTAT ) 0.4 MG SL tablet Place 1 tablet (0.4 mg total) under the tongue every 5 (five) minutes as needed. 25 tablet 1   Oxycodone  HCl 20 MG TABS Take 1 tablet (20 mg total) by mouth 4 (four) times daily as needed 120 tablet 0   Oxycodone  HCl 20 MG TABS Take 1 tablet (20 mg total) by mouth every 4 (four) hours as needed. 180 tablet 0   pregabalin  (LYRICA ) 150 MG capsule Take 1 capsule (150 mg total) by mouth 2 (two) times daily. 60 capsule 2   traZODone  (DESYREL ) 100 MG tablet Take 1 tablet (100 mg total) by mouth at bedtime. FOR SLEEP 90 tablet 1   valACYclovir  (VALTREX ) 1000 MG tablet Take 1 tablet (1,000 mg total) by mouth daily. 90 tablet 3   famotidine  (PEPCID ) 40 MG tablet Take 1 tablet (40 mg total) by mouth daily. For acid reflux 90 tablet 1   No facility-administered medications prior to visit.    Allergies  Allergen Reactions   Porcine (Pork) Protein-Containing Drug Products Other (See Comments)    No pork products; pt is a Muslim       Objective:    BP 110/76 (BP Location: Left Arm, Patient Position: Sitting, Cuff Size: Normal)   Pulse 67   Resp 18   Ht 6' (1.829 m)   Wt 193 lb 9.6 oz (87.8 kg)   SpO2 98%   BMI 26.26 kg/m  Wt  Readings from Last 3 Encounters:  01/21/24 193 lb 9.6 oz (87.8 kg)  10/28/23 199 lb (90.3 kg)  08/13/23 199 lb (90.3 kg)    Physical Exam Vitals and nursing note reviewed.  Constitutional:      Appearance: He is well-developed.  HENT:     Head: Normocephalic and atraumatic.     Right Ear: There is impacted cerumen.  Cardiovascular:     Rate and Rhythm: Normal rate and regular rhythm.     Heart sounds: Normal heart sounds. No murmur heard.    No friction rub. No gallop.  Pulmonary:  Effort: Pulmonary effort is normal. No tachypnea or respiratory distress.     Breath sounds: Normal breath sounds. No decreased breath sounds, wheezing, rhonchi or rales.  Chest:     Chest wall: No tenderness.  Musculoskeletal:        General: Normal range of motion.     Cervical back: Normal range of motion.  Skin:    General: Skin is warm and dry.  Neurological:     Mental Status: He is alert and oriented to person, place, and time.     Coordination: Coordination normal.  Psychiatric:        Behavior: Behavior normal. Behavior is cooperative.        Thought Content: Thought content normal.        Judgment: Judgment normal.          Patient has been counseled extensively about nutrition and exercise as well as the importance of adherence with medications and regular follow-up. The patient was given clear instructions to go to ER or return to medical center if symptoms don't improve, worsen or new problems develop. The patient verbalized understanding.   Follow-up: Return in about 4 months (around 05/20/2024).   Haze LELON Servant, FNP-BC [facility] [place], [place] [phone]   01/21/2024, 3:38 PM

## 2024-01-22 ENCOUNTER — Telehealth (HOSPITAL_BASED_OUTPATIENT_CLINIC_OR_DEPARTMENT_OTHER): Payer: Self-pay

## 2024-01-22 ENCOUNTER — Ambulatory Visit: Payer: Self-pay | Admitting: Nurse Practitioner

## 2024-01-22 LAB — CMP14+EGFR
ALT: 11 IU/L (ref 0–44)
AST: 12 IU/L (ref 0–40)
Albumin: 4.2 g/dL (ref 3.8–4.9)
Alkaline Phosphatase: 69 IU/L (ref 47–123)
BUN/Creatinine Ratio: 12 (ref 9–20)
BUN: 10 mg/dL (ref 6–24)
Bilirubin Total: 0.3 mg/dL (ref 0.0–1.2)
CO2: 26 mmol/L (ref 20–29)
Calcium: 9.2 mg/dL (ref 8.7–10.2)
Chloride: 99 mmol/L (ref 96–106)
Creatinine, Ser: 0.81 mg/dL (ref 0.76–1.27)
Globulin, Total: 2.3 g/dL (ref 1.5–4.5)
Glucose: 108 mg/dL — ABNORMAL HIGH (ref 70–99)
Potassium: 4.6 mmol/L (ref 3.5–5.2)
Sodium: 139 mmol/L (ref 134–144)
Total Protein: 6.5 g/dL (ref 6.0–8.5)
eGFR: 102 mL/min/1.73 (ref >59)

## 2024-01-22 LAB — HEMOGLOBIN A1C
Est. average glucose Bld gHb Est-mCnc: 108 mg/dL
Hgb A1c MFr Bld: 5.4 % (ref 4.8–5.6)

## 2024-01-22 NOTE — Telephone Encounter (Signed)
   Name: Corey Hale  DOB: 1965/09/03  MRN: [id number]  Primary Cardiologist: Lonni Cash, MD  Chart reviewed as part of pre-operative protocol coverage. Because of Rihaan Barrack Blaustein's past medical history and time since last visit, he will require a follow-up in-office visit in order to better assess preoperative cardiovascular risk. (Sent it back earlier as a VV but should be changed). Last office noted states that he has language and communication issues due to hearing deficiencies.  Pre-op covering staff: - Please schedule appointment and call patient to inform them. If patient already had an upcoming appointment within acceptable timeframe, please add pre-op clearance to the appointment notes so provider is aware. - Please contact requesting surgeon's office via preferred method (i.e, phone, fax) to inform them of need for appointment prior to surgery.  Lamarr Satterfield, NP  01/22/2024, 8:06 AM

## 2024-01-22 NOTE — Telephone Encounter (Signed)
   Pre-operative Risk Assessment    Patient Name: Corey Hale  DOB: 03/31/1965 MRN: [id number]   Date of last office visit: 10/14/23 with Lucien Date of next office visit: 04/17/2024 with Dr. 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 Lonni Pizza Surgeon's Group or Practice Name:  [facility] Phone number:  [phone] Fax number:  [phone]   Type of Clearance Requested:   - Medical  - Pharmacy:  Hold Aspirin  not indicated   Type of Anesthesia:  General    Additional requests/questions:    SignedAugustin JONETTA Daring   01/22/2024, 7:36 AM

## 2024-01-22 NOTE — Telephone Encounter (Signed)
 I s/w the pt and he has agreed to appt in office for preop clearance 01/30/24 @ 10:55 with Hao Meng, PAC.   Pt asked if I would send new address to [facility] CHART for him.

## 2024-01-22 NOTE — Telephone Encounter (Signed)
   Name: Corey Hale  DOB: 1965-09-18  MRN: [id number]  Primary Cardiologist: Lonni Cash, MD   Preoperative team, please contact this patient and set up a phone call appointment for further preoperative risk assessment. Please obtain consent and complete medication review. Thank you for your help.  I confirm that guidance regarding antiplatelet and oral anticoagulation therapy has been completed and, if necessary, noted below.  Per office protocol, if patient is without any new symptoms or concerns at the time of their virtual visit, he may hold ASA for 7 days prior to procedure. Please resume ASA as soon as possible postprocedure, at the discretion of the surgeon.    I also confirmed the patient resides in the state of  . As per [facility] Medical Board telemedicine laws, the patient must reside in the state in which the provider is licensed.   Lamarr Satterfield, NP 01/22/2024, 7:57 AM [facility]

## 2024-01-30 ENCOUNTER — Ambulatory Visit: Attending: Physician Assistant | Admitting: Physician Assistant

## 2024-01-30 ENCOUNTER — Encounter: Payer: Self-pay | Admitting: Physician Assistant

## 2024-01-30 VITALS — BP 124/72 | HR 72 | Resp 16 | Ht 72.0 in | Wt 194.4 lb

## 2024-01-30 DIAGNOSIS — Z01818 Encounter for other preprocedural examination: Secondary | ICD-10-CM | POA: Insufficient documentation

## 2024-01-30 DIAGNOSIS — I10 Essential (primary) hypertension: Secondary | ICD-10-CM | POA: Insufficient documentation

## 2024-01-30 DIAGNOSIS — E785 Hyperlipidemia, unspecified: Secondary | ICD-10-CM | POA: Insufficient documentation

## 2024-01-30 DIAGNOSIS — I2510 Atherosclerotic heart disease of native coronary artery without angina pectoris: Secondary | ICD-10-CM | POA: Insufficient documentation

## 2024-01-30 NOTE — Progress Notes (Signed)
 Cardiology Office Note   Date:  01/30/2024  ID:  Corey Hale, DOB [birth year], MRN [id number] PCP: Theotis Haze ORN, NP  [facility] Providers Cardiologist:  Lonni Cash, MD     History of Present Illness Corey Hale is a 58 y.o. male with past medical history of hypertension, hyperlipidemia, prediabetes, tobacco dependence, GERD and CAD.  Patient was previously admitted in September 2024 with late presenting inferior STEMI, troponin up to 8900.  Cardiac catheterization performed on 12/05/2022 showed 30% proximal RCA, 100% distal RCA occlusion, 30% mid LAD and 20% distal LAD lesion.  Occluded mid to distal RCA had large thrombus burden and faint left-to-right collaterals.  Due to large thrombus, ectatic and tortuous vessels, risk of PCI outweighs the benefit especially in the setting of left-to-right collaterals.  Medical therapy was recommended.  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  Patient was placed on Plavix  and colchicine .  Previous plan was to continue aspirin  and Plavix  for 1 year then Plavix  indefinitely.  Echocardiogram obtained on 12/05/2023 showed EF 60 to 65%, grade 3 DD, mild hypokinesis of the LV, basal inferoseptal, inferior wall and inferolateral wall.  Since his MRI, he has quit smoking.  Patient presents today for preop clearance prior to anal fistula fistulotomy versus draining seton under general anesthesia by Dr. Lonni Pizza of [facility] surgery.  He denies any recent chest pain or shortness of breath.  He does have occasional  swelling sensation in the chest that is relieved by burping, this is consistent with his GI symptoms.  He can walk 2 blocks away from his home and back without any issue.  He has been able to climb up several flight of stairs without any exertional symptom either.  His Plavix  was discontinued last month.  He remains on aspirin  and atorvastatin .  Blood pressure is very  well-controlled.  He is at acceptable risk to proceed with upcoming surgery.  If needed, he may hold aspirin  for 7 days prior to the surgery and resume as soon as possible afterward at the surgeon's discretion.  He is due for fasting lipid panel, this can be obtained prior to the next office visit.  ROS:   He denies chest pain, palpitations, dyspnea, pnd, orthopnea, n, v, dizziness, syncope, edema, weight gain, or early satiety. All other systems reviewed and are otherwise negative except as noted above.    Studies Reviewed      Cardiac Studies & Procedures   ______________________________________________________________________________________________ CARDIAC CATHETERIZATION  CARDIAC CATHETERIZATION 12/05/2022  Conclusion   Prox RCA lesion is 30% stenosed.   Dist RCA lesion is 100% stenosed.   Mid LAD lesion is 30% stenosed.   Dist LAD lesion is 20% stenosed.   There is mild left ventricular systolic dysfunction.   LV end diastolic pressure is normal.   The left ventricular ejection fraction is 55-65% by visual estimate.  1.  The coronary arteries are ectatic with severe one-vessel coronary artery disease.  Occluded mid to distal right coronary artery with large thrombus burden and faint left-to-right collaterals. 2.  Mildly reduced LV systolic function with an EF of 40 to 45% with inferior wall hypokinesis.  Normal left ventricular end-diastolic pressure.  Recommendations: This is a late presenting inferior STEMI and onset of symptoms is 4 days ago.  Due to large thrombus, ectatic and tortuous vessel, risks of PCI outweighed the benefit especially in the setting of left-to-right collaterals.  Recommend medical therapy.  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 I added clopidogrel  to be used for 1 year. I also added colchicine  to be used for few weeks. Wean off nitroglycerin  drip.  Findings Coronary Findings Diagnostic   Dominance: Co-dominant  Left Main Vessel is large. Vessel is angiographically normal.  Left Anterior Descending Mid LAD lesion is 30% stenosed. Dist LAD lesion is 20% stenosed.  Left Circumflex Vessel is large. The vessel exhibits minimal luminal irregularities.  First Obtuse Marginal Branch Vessel is small in size.  Second Obtuse Marginal Branch Vessel is angiographically normal.  Third Obtuse Marginal Branch Vessel is angiographically normal.  Right Coronary Artery Vessel is large. Prox RCA lesion is 30% stenosed. Dist RCA lesion is 100% stenosed. The lesion is heavily thrombotic with left-to-right collateral flow.  Right Posterior Descending Artery Collaterals RPDA filled by collaterals from Dist LAD.  Intervention  No interventions have been documented.     ECHOCARDIOGRAM  ECHOCARDIOGRAM COMPLETE 12/05/2022  Narrative ECHOCARDIOGRAM REPORT    Patient Name:   Corey Hale Date of Exam: 12/05/2022 Medical Rec #:  [id number]     Height:       72.0 in Accession #:    [id number]    Weight:       184.5 lb Date of Birth:  Apr 03, 1965     BSA:          2.059 m Patient Age:    57 years      BP:           102/71 mmHg Patient Gender: M             HR:           77 bpm. Exam Location:  Inpatient  Procedure: 2D Echo, Color Doppler, Cardiac Doppler and Intracardiac Opacification Agent  Indications:    NSTEMI I21.4  History:        Patient has no prior history of Echocardiogram examinations. NSTEMI; Risk Factors:Current Smoker, Hypertension and Dyslipidemia.  Sonographer:    Madeline Finder Referring Phys: [id number] NATHAN P GOODWIN   Sonographer Comments: Image acquisition challenging due to patient body habitus. IMPRESSIONS   1. Left ventricular ejection fraction, by estimation, is 60 to 65%. The left ventricle has normal function. The left ventricle has no regional wall motion abnormalities. Left ventricular diastolic parameters are consistent with Grade II  diastolic dysfunction (pseudonormalization). There is mild hypokinesis of the left ventricular, basal inferoseptal wall, inferior wall and inferolateral wall. 2. Right ventricular systolic function is normal. The right ventricular size is normal. There is normal pulmonary artery systolic pressure. 3. Left atrial size was mildly dilated. 4. The mitral valve is normal in structure. No evidence of mitral valve regurgitation. No evidence of mitral stenosis. 5. The aortic valve is normal in structure. Aortic valve regurgitation is not visualized. No aortic stenosis is present. 6. The inferior vena cava is normal in size with greater than 50% respiratory variability, suggesting right atrial pressure of 3 mmHg.  FINDINGS Left Ventricle: Left ventricular ejection fraction, by estimation, is 60 to 65%. The left ventricle has normal function. The left ventricle has no regional wall motion abnormalities. Mild hypokinesis of the left ventricular, basal inferoseptal wall, inferior wall and inferolateral wall. Definity  contrast agent was given IV to delineate the left ventricular endocardial borders. The left ventricular internal cavity size was normal in size. There is no left ventricular hypertrophy. Left ventricular diastolic parameters are consistent with Grade II diastolic dysfunction (  pseudonormalization).  Right Ventricle: The right ventricular size is normal. No increase in right ventricular wall thickness. Right ventricular systolic function is normal. There is normal pulmonary artery systolic pressure. The tricuspid regurgitant velocity is 1.16 m/s, and with an assumed right atrial pressure of 8 mmHg, the estimated right ventricular systolic pressure is 13.4 mmHg.  Left Atrium: Left atrial size was mildly dilated.  Right Atrium: Right atrial size was normal in size.  Pericardium: There is no evidence of pericardial effusion.  Mitral Valve: The mitral valve is normal in structure. No evidence of  mitral valve regurgitation. No evidence of mitral valve stenosis.  Tricuspid Valve: The tricuspid valve is normal in structure. Tricuspid valve regurgitation is not demonstrated. No evidence of tricuspid stenosis.  Aortic Valve: The aortic valve is normal in structure. Aortic valve regurgitation is not visualized. No aortic stenosis is present.  Pulmonic Valve: The pulmonic valve was normal in structure. Pulmonic valve regurgitation is not visualized. No evidence of pulmonic stenosis.  Aorta: The aortic root is normal in size and structure.  Venous: The inferior vena cava is normal in size with greater than 50% respiratory variability, suggesting right atrial pressure of 3 mmHg.  IAS/Shunts: No atrial level shunt detected by color flow Doppler.   LEFT VENTRICLE PLAX 2D LVIDd:         5.00 cm   Diastology LVIDs:         3.20 cm   LV e' medial:    4.13 cm/s LV PW:         1.00 cm   LV E/e' medial:  18.9 LV IVS:        1.00 cm   LV e' lateral:   6.53 cm/s LVOT diam:     2.00 cm   LV E/e' lateral: 11.9 LV SV:         54 LV SV Index:   26 LVOT Area:     3.14 cm   RIGHT VENTRICLE RV S prime:     16.60 cm/s TAPSE (M-mode): 2.6 cm  LEFT ATRIUM             Index        RIGHT ATRIUM           Index LA diam:        4.50 cm 2.19 cm/m   RA Area:     11.50 cm LA Vol (A2C):   37.7 ml 18.31 ml/m  RA Volume:   27.80 ml  13.50 ml/m LA Vol (A4C):   41.1 ml 19.96 ml/m LA Biplane Vol: 40.9 ml 19.87 ml/m AORTIC VALVE LVOT Vmax:   102.00 cm/s LVOT Vmean:  66.100 cm/s LVOT VTI:    0.173 m  AORTA Ao Root diam: 3.20 cm Ao Asc diam:  4.10 cm  MITRAL VALVE               TRICUSPID VALVE MV Area (PHT): 3.12 cm    TR Peak grad:   5.4 mmHg MV Decel Time: 243 msec    TR Vmax:        116.00 cm/s MR Peak grad: 2.2 mmHg MR Vmax:      75.00 cm/s   SHUNTS MV E velocity: 78.00 cm/s  Systemic VTI:  0.17 m MV A velocity: 81.80 cm/s  Systemic Diam: 2.00 cm MV E/A ratio:  0.95  Mihai Croitoru  MD Electronically signed by Jerel Balding MD Signature Date/Time: 12/05/2022/3:22:10 PM    Final  ______________________________________________________________________________________________      Risk Assessment/Calculations           Physical Exam VS:  BP 124/72 (BP Location: Left Arm, Patient Position: Sitting, Cuff Size: Large)   Pulse 72   Resp 16   Ht 6' (1.829 m)   Wt 196 lb (88.9 kg)   SpO2 97%   BMI 26.58 kg/m        Wt Readings from Last 3 Encounters:  01/30/24 196 lb (88.9 kg)  01/21/24 193 lb 9.6 oz (87.8 kg)  10/28/23 199 lb (90.3 kg)    GEN: Well nourished, well developed in no acute distress NECK: No JVD; No carotid bruits CARDIAC: RRR, no murmurs, rubs, gallops RESPIRATORY:  Clear to auscultation without rales, wheezing or rhonchi  ABDOMEN: Soft, non-tender, non-distended EXTREMITIES:  No edema; No deformity   ASSESSMENT AND PLAN  Preoperative clearance: Upcoming anal fistula fistulotomy versus draining seton under general anesthesia by Dr. Lonni Pizza.  Patient denies any recent exertional chest pain.  He can clearly accomplish more than 4 metabolic level of activity.  He is at acceptable risk to proceed from the cardiac perspective.  If needed, he may hold aspirin  for 7 days prior to the procedure and restart as soon as possible afterward at the surgeon's discretion.  CAD: Patient had prior inferior STEMI in September 2024, he had 100% occluded distal RCA with distal collateral, due to large thrombus, risk of the PCI outweighed the potential benefit.  He has since completed 1 year of dual antiplatelet therapy, he may stop Plavix  and continue aspirin  monotherapy.  Hypertension: Blood pressure well-controlled  Hyperlipidemia: On atorvastatin .       Dispo: Follow-up in 6 months  Signed, Jamin Humphries, [place]

## 2024-01-30 NOTE — Patient Instructions (Addendum)
 Medication Instructions:  IF NEEDED : HOLD Aspirin  81mg  for 7 days prior to surgery  *If you need a refill on your cardiac medications before your next appointment, please call your pharmacy*  Lab Work: LIPID panel (Fasting) ; ok to have water or black coffee, no cream, no sugar Can be done anytime before your January appointment with Dr. Barbette Lanius may go to any of these [facility] locations:   [facility] - [address] ([facility]) - [phone] [address]1st floor, next to pharmacy)   [place] - [address]    [place]  - [address]    [place] - [address] - [address] [employer]  - [address] ([facility])  [place]   - [address], [address]  Testing/Procedures: None  Follow-Up: At [facility], you and your health needs are our priority.  As part of our continuing mission to provide you with exceptional heart care, our providers are all part of one team.  This team includes your primary Cardiologist (physician) and Advanced Practice Providers or APPs (Physician Assistants and Nurse Practitioners) who all work together to provide you with the care you need, when you need it.  Your next appointment:   Keep follow up appointment as scheduled on April 17, 2024 at 11:00am  Provider:   Lonni Cash, MD

## 2024-02-01 NOTE — Telephone Encounter (Signed)
 Patient has been cleared for the upcoming surgery.  Please see office note from 01/30/2024.  I have forwarded my office note to the surgeon's office.

## 2024-02-11 ENCOUNTER — Other Ambulatory Visit: Payer: Self-pay

## 2024-02-11 ENCOUNTER — Ambulatory Visit: Payer: Self-pay

## 2024-02-11 DIAGNOSIS — M79671 Pain in right foot: Secondary | ICD-10-CM

## 2024-02-11 NOTE — Telephone Encounter (Signed)
 Patient identified by name and date of birth.  Provider aware of patient foot pain and is aware that patient is also taking Lyrica . Podiatry referral sent. Patient aware of response from provider and voiced understanding.

## 2024-02-11 NOTE — Telephone Encounter (Signed)
 Copied from CRM [id number]. Topic: Clinical - Red Word Triage >> Feb 11, 2024 10:51 AM Larissa RAMAN wrote: [facility] that prompted transfer to Nurse Triage: rt foot pain  System froze: unable to hear patient: therefore had to reboot

## 2024-02-14 ENCOUNTER — Other Ambulatory Visit (HOSPITAL_COMMUNITY): Payer: Self-pay

## 2024-02-14 DIAGNOSIS — Z79899 Other long term (current) drug therapy: Secondary | ICD-10-CM | POA: Diagnosis not present

## 2024-02-14 DIAGNOSIS — I10 Essential (primary) hypertension: Secondary | ICD-10-CM | POA: Diagnosis not present

## 2024-02-14 DIAGNOSIS — M51369 Other intervertebral disc degeneration, lumbar region without mention of lumbar back pain or lower extremity pain: Secondary | ICD-10-CM | POA: Diagnosis not present

## 2024-02-14 DIAGNOSIS — S22000G Wedge compression fracture of unspecified thoracic vertebra, subsequent encounter for fracture with delayed healing: Secondary | ICD-10-CM | POA: Diagnosis not present

## 2024-02-14 MED ORDER — OXYCODONE HCL 20 MG PO TABS
20.0000 mg | ORAL_TABLET | ORAL | 0 refills | Status: DC | PRN
  Filled 2024-02-14: qty 180, 30d supply, fill #0

## 2024-02-14 MED ORDER — PREGABALIN 150 MG PO CAPS
150.0000 mg | ORAL_CAPSULE | Freq: Two times a day (BID) | ORAL | 2 refills | Status: AC
  Filled 2024-02-14: qty 60, 30d supply, fill #0

## 2024-02-14 MED ORDER — NALOXONE HCL 4 MG/0.1ML NA LIQD
NASAL | 1 refills | Status: AC
  Filled 2024-02-14: qty 2, 1d supply, fill #0

## 2024-02-28 ENCOUNTER — Ambulatory Visit: Admitting: Podiatry

## 2024-02-28 ENCOUNTER — Ambulatory Visit (INDEPENDENT_AMBULATORY_CARE_PROVIDER_SITE_OTHER): Admitting: Podiatry

## 2024-02-28 ENCOUNTER — Other Ambulatory Visit: Payer: Self-pay

## 2024-02-28 ENCOUNTER — Ambulatory Visit (INDEPENDENT_AMBULATORY_CARE_PROVIDER_SITE_OTHER)

## 2024-02-28 ENCOUNTER — Encounter: Payer: Self-pay | Admitting: Podiatry

## 2024-02-28 ENCOUNTER — Other Ambulatory Visit (HOSPITAL_COMMUNITY): Payer: Self-pay

## 2024-02-28 DIAGNOSIS — M65972 Unspecified synovitis and tenosynovitis, left ankle and foot: Secondary | ICD-10-CM

## 2024-02-28 DIAGNOSIS — M2012 Hallux valgus (acquired), left foot: Secondary | ICD-10-CM

## 2024-02-28 DIAGNOSIS — M65971 Unspecified synovitis and tenosynovitis, right ankle and foot: Secondary | ICD-10-CM

## 2024-02-28 DIAGNOSIS — M722 Plantar fascial fibromatosis: Secondary | ICD-10-CM

## 2024-02-28 DIAGNOSIS — M2011 Hallux valgus (acquired), right foot: Secondary | ICD-10-CM

## 2024-02-28 MED ORDER — PREDNISONE 5 MG PO TABS
ORAL_TABLET | ORAL | 1 refills | Status: AC
  Filled 2024-02-28 (×2): qty 42, 12d supply, fill #0
  Filled 2024-04-20: qty 42, 12d supply, fill #1

## 2024-02-28 NOTE — Progress Notes (Signed)
 Patient presents with complaint of pain along the arch of the foot along the medial ankle and foot.  Hurts when mostly first stands up in the morning.  Does not recall any injury to the foot.  Has not noticed any redness swelling or ecchymosis.  Some tenderness around the bunions bilaterally.  This bothers him with a lot of shoes now.  Has had problems with over several years with problems getting worse.   Physical exam: general appearance: Pleasant, and in no acute distress. AOx3.  Vascular: Pedal pulses: DP 2/4 bilaterally, PT 2/4 bilaterally. Mild edema lower legs bilaterally. Capillary fill time needed bilaterally.  Neurological: Light touch intact feet bilaterally.  Normal Achilles reflex bilaterally.  No clonus or spasticity noted.  Tinel sign tarsal tunnel and porta pedis bilaterally  Dermatologic:   Skin normal temperature bilaterally.  Skin normal color, tone, and texture bilaterally.   Musculoskeletal: Moderate to severe hallux valgus deformity bilaterally.  Tenderness along the distal one half of the plantar fascia with no fibromas palpable.  Tenderness on the posterior tibial tenosynovitis from the nail medial malleolus to the navicular tuberosity bilaterally.  Normal muscle strength lower extremity bilaterally.  Radiographs: 3 views feet bilaterally: Moderate to severe hallux abductovalgus deformity bilaterally.  No osteophytic changes around calcaneus.  Normal bone density.  No evidence of fractures or dislocations.  Pes planus bilaterally   Diagnosis: 1.  Plantar fasciitis bilaterally. 2.  Posterior tibial tenosynovitis bilaterally. 3.  Hallux valgus bilaterally.  Plan: -New patient office visit for evaluation and management level 3. - Discussed with him the plantar fasciitis and posterior tibial tenosynovitis.  Recommend wearing good supportive shoes avoid flat soled shoes or going.  Will give stretching exercises.  Recommend icing. -Gave written and exercises to do at  home. - Discussed the HAV deformity.  Discussed etiology and treatment.  If they do continue to bother him surgical options could be considered. - Dispensed pads for the bunions bilaterally. -Dispensed OTC foot orthoses bilaterally   Return does not feel any injury of the feet.  Some tender weeks follow-up plantar fasciitis and PTTD tenosynovitis bilaterally

## 2024-03-03 ENCOUNTER — Other Ambulatory Visit: Payer: Self-pay

## 2024-03-08 DIAGNOSIS — R030 Elevated blood-pressure reading, without diagnosis of hypertension: Secondary | ICD-10-CM | POA: Diagnosis not present

## 2024-03-08 DIAGNOSIS — M51369 Other intervertebral disc degeneration, lumbar region without mention of lumbar back pain or lower extremity pain: Secondary | ICD-10-CM | POA: Diagnosis not present

## 2024-03-08 DIAGNOSIS — I10 Essential (primary) hypertension: Secondary | ICD-10-CM | POA: Diagnosis not present

## 2024-03-08 DIAGNOSIS — S22000G Wedge compression fracture of unspecified thoracic vertebra, subsequent encounter for fracture with delayed healing: Secondary | ICD-10-CM | POA: Diagnosis not present

## 2024-03-08 DIAGNOSIS — Z79899 Other long term (current) drug therapy: Secondary | ICD-10-CM | POA: Diagnosis not present

## 2024-03-09 ENCOUNTER — Other Ambulatory Visit: Payer: Self-pay

## 2024-03-09 ENCOUNTER — Other Ambulatory Visit (HOSPITAL_COMMUNITY): Payer: Self-pay

## 2024-03-09 MED ORDER — OXYCODONE HCL 20 MG PO TABS
20.0000 mg | ORAL_TABLET | ORAL | 0 refills | Status: DC | PRN
  Filled 2024-03-12 – 2024-03-13 (×2): qty 180, 30d supply, fill #0

## 2024-03-09 MED ORDER — NALOXONE HCL 4 MG/0.1ML NA LIQD
1.0000 | NASAL | 1 refills | Status: AC | PRN
  Filled 2024-03-09 (×2): qty 2, 1d supply, fill #0

## 2024-03-09 MED ORDER — PREGABALIN 150 MG PO CAPS
150.0000 mg | ORAL_CAPSULE | Freq: Two times a day (BID) | ORAL | 2 refills | Status: AC
  Filled 2024-03-09 – 2024-03-13 (×3): qty 60, 30d supply, fill #0

## 2024-03-13 ENCOUNTER — Other Ambulatory Visit (HOSPITAL_COMMUNITY): Payer: Self-pay

## 2024-03-13 ENCOUNTER — Other Ambulatory Visit: Payer: Self-pay

## 2024-03-24 ENCOUNTER — Ambulatory Visit (INDEPENDENT_AMBULATORY_CARE_PROVIDER_SITE_OTHER): Admitting: Podiatry

## 2024-03-24 DIAGNOSIS — M65972 Unspecified synovitis and tenosynovitis, left ankle and foot: Secondary | ICD-10-CM | POA: Diagnosis not present

## 2024-03-24 MED ORDER — TRIAMCINOLONE ACETONIDE 40 MG/ML IJ SUSP
40.0000 mg | Freq: Once | INTRAMUSCULAR | Status: AC
  Administered 2024-03-24: 40 mg

## 2024-03-24 NOTE — Progress Notes (Signed)
 Density complaining pain mostly around the medial aspect of the foot at the navicular tuberosity and along the posterior tibial tendon on the left.  Says you are pain along the arches and heels is pretty much dissipated.   Physical exam:  General appearance: Pleasant, and in no acute distress. AOx3.  Vascular: Pedal pulses: DP 2/4 bilaterally, PT 2/4 bilaterally.  Minimal edema lower legs bilaterally. Capillary fill time and needed bilaterally.  Neurological: Negative Tinel's sign tarsal tunnel and porta pedis bilaterally.  Grossly intact bilaterally  Dermatologic:   Skin normal temperature bilaterally.  Skin normal color, tone, and texture bilaterally.   Musculoskeletal: Tenderness along the posterior tibial tendon left near the navicular tuberosity and proximally about 2 to 3 cm.  No tenderness along plantar fascia today.  Normal muscle strength lower extremity bilaterally    Diagnosis: 1.  Posterior tibial tenosynovitis left  Plan: -Recommend icing 20 minutes an hour 3-4 times a day.  Wear good stable supportive shoes.  -injected 3cc 2:1 mixture 0.5 cc Marcaine : Triamcinolone  40mg /32ml along posterior tibial tendon sheath left distally.     Return 2 weeks follow-up injection posterior tibial tendon left

## 2024-04-02 ENCOUNTER — Other Ambulatory Visit: Payer: Self-pay

## 2024-04-05 ENCOUNTER — Other Ambulatory Visit: Payer: Self-pay | Admitting: Family Medicine

## 2024-04-05 DIAGNOSIS — F5101 Primary insomnia: Secondary | ICD-10-CM

## 2024-04-07 ENCOUNTER — Ambulatory Visit: Admitting: Podiatry

## 2024-04-07 ENCOUNTER — Other Ambulatory Visit: Payer: Self-pay

## 2024-04-07 DIAGNOSIS — G5762 Lesion of plantar nerve, left lower limb: Secondary | ICD-10-CM

## 2024-04-07 MED ORDER — TRAZODONE HCL 100 MG PO TABS
100.0000 mg | ORAL_TABLET | Freq: Every day | ORAL | 1 refills | Status: AC
  Filled 2024-04-07: qty 90, 90d supply, fill #0

## 2024-04-07 NOTE — Progress Notes (Signed)
 Patient presents for complaint of some lingering pain in the forefoot left much greater than right.  Has a pain around the rear foot on the left around the posterior tibial tendon and heel is resolved.  Has not noticed any redness or swelling in the forefoot on the left or the right.   Physical exam:  General appearance: Pleasant, and in no acute distress. AOx3.  Vascular: Pedal pulses: DP 2/4 bilaterally, PT 2 to/4 bilaterally.  Minimal edema lower legs bilaterally. Capillary fill time immediate bilaterally.  Neurological: Light touch intact feet bilaterally.  Normal Achilles reflex bilaterally.  No clonus or spasticity noted.  Positive Mulder sign second IMS left  Dermatologic:   Skin normal temperature bilaterally.  Skin normal color, tone, and texture bilaterally.   Musculoskeletal: Some soreness on the plantar lateral aspect of the second MTP left.  No tenderness with range of motion of the lesser MTPs.  No tenderness to palpation along the posterior tibial tendon or the plantar fascia left.  Diagnosis: 1.  Neuroma second IMS left  Plan: -Stemetil's visit for evaluation and management level 3. - Scaling might have developed a neuroma from probably from compensating for the posterior tibial tendon tenosynovitis and plantar fasciitis.  Told that should gradually go away.  Might of aggravated neuroma in the second IMS on the left foot he has a unused prednisone  taper at home for 12 days.  Told to go ahead and take this as woefully relieves the most the remaining inflammation in the foot.  Send good comfortable supportive shoes.  If pain of the forefoot continues he can call for an appointment and we can consider an injection.   Return as needed

## 2024-04-09 ENCOUNTER — Other Ambulatory Visit (HOSPITAL_COMMUNITY): Payer: Self-pay

## 2024-04-09 ENCOUNTER — Other Ambulatory Visit: Payer: Self-pay

## 2024-04-09 MED ORDER — NALOXONE HCL 4 MG/0.1ML NA LIQD
1.0000 | NASAL | 0 refills | Status: AC | PRN
  Filled 2024-04-09: qty 2, 2d supply, fill #0

## 2024-04-09 MED ORDER — PREGABALIN 150 MG PO CAPS
150.0000 mg | ORAL_CAPSULE | Freq: Two times a day (BID) | ORAL | 2 refills | Status: AC
  Filled 2024-04-09: qty 60, 30d supply, fill #0

## 2024-04-09 MED ORDER — OXYCODONE HCL 20 MG PO TABS
20.0000 mg | ORAL_TABLET | ORAL | 0 refills | Status: AC | PRN
  Filled 2024-04-09: qty 180, 30d supply, fill #0

## 2024-04-09 MED ORDER — PREGABALIN 150 MG PO CAPS
150.0000 mg | ORAL_CAPSULE | Freq: Two times a day (BID) | ORAL | 2 refills | Status: AC
  Filled 2024-04-09 – 2024-04-10 (×3): qty 60, 30d supply, fill #0

## 2024-04-09 MED ORDER — NALOXONE HCL 4 MG/0.1ML NA LIQD
1.0000 | NASAL | 0 refills | Status: AC | PRN
  Filled 2024-04-09: qty 2, 1d supply, fill #0

## 2024-04-09 MED ORDER — OXYCODONE HCL 20 MG PO TABS
20.0000 mg | ORAL_TABLET | ORAL | 0 refills | Status: AC | PRN
  Filled 2024-04-09 – 2024-04-10 (×2): qty 180, 30d supply, fill #0

## 2024-04-10 ENCOUNTER — Other Ambulatory Visit: Payer: Self-pay

## 2024-04-10 ENCOUNTER — Other Ambulatory Visit (HOSPITAL_COMMUNITY): Payer: Self-pay

## 2024-04-15 ENCOUNTER — Other Ambulatory Visit: Payer: Self-pay

## 2024-04-16 ENCOUNTER — Other Ambulatory Visit (HOSPITAL_COMMUNITY): Payer: Self-pay

## 2024-04-17 ENCOUNTER — Other Ambulatory Visit (HOSPITAL_COMMUNITY): Payer: Self-pay

## 2024-04-17 ENCOUNTER — Other Ambulatory Visit: Payer: Self-pay | Admitting: *Deleted

## 2024-04-17 ENCOUNTER — Ambulatory Visit: Attending: Cardiovascular Disease | Admitting: Cardiovascular Disease

## 2024-04-17 ENCOUNTER — Encounter: Payer: Self-pay | Admitting: Cardiovascular Disease

## 2024-04-17 VITALS — BP 120/64 | HR 80 | Ht 72.0 in | Wt 197.0 lb

## 2024-04-17 DIAGNOSIS — E785 Hyperlipidemia, unspecified: Secondary | ICD-10-CM | POA: Insufficient documentation

## 2024-04-17 DIAGNOSIS — I2510 Atherosclerotic heart disease of native coronary artery without angina pectoris: Secondary | ICD-10-CM | POA: Insufficient documentation

## 2024-04-17 DIAGNOSIS — I10 Essential (primary) hypertension: Secondary | ICD-10-CM | POA: Insufficient documentation

## 2024-04-17 DIAGNOSIS — Z720 Tobacco use: Secondary | ICD-10-CM | POA: Diagnosis present

## 2024-04-17 MED ORDER — METOPROLOL SUCCINATE ER 25 MG PO TB24
25.0000 mg | ORAL_TABLET | Freq: Two times a day (BID) | ORAL | 3 refills | Status: AC
  Filled 2024-04-17 – 2024-04-20 (×2): qty 180, 90d supply, fill #0

## 2024-04-17 NOTE — Patient Instructions (Signed)
 Medication Instructions:  Increase Metoprolol  Succinate to 25 mg by mouth twice daily  *If you need a refill on your cardiac medications before your next appointment, please call your pharmacy*  Lab Work: Have fasting lab work done in the next week or so..  Lipid and liver profiles.  Can be done at the [facility] on the first floor in our building our any [facility] location If you have labs (blood work) drawn today and your tests are completely normal, you will receive your results only by: MyChart Message (if you have MyChart) OR A paper copy in the mail If you have any lab test that is abnormal or we need to change your treatment, we will call you to review the results.  Testing/Procedures: none  Follow-Up: At [facility], you and your health needs are our priority.  As part of our continuing mission to provide you with exceptional heart care, our providers are all part of one team.  This team includes your primary Cardiologist (physician) and Advanced Practice Providers or APPs (Physician Assistants and Nurse Practitioners) who all work together to provide you with the care you need, when you need it.  Your next appointment:   12 month(s)  Provider:   Lonni Cash, MD    We recommend signing up for the patient portal called MyChart.  Sign up information is provided on this After Visit Summary.  MyChart is used to connect with patients for Virtual Visits (Telemedicine).  Patients are able to view lab/test results, encounter notes, upcoming appointments, etc.  Non-urgent messages can be sent to your provider as well.   To learn more about what you can do with MyChart, go to [email or web address].   Other Instructions

## 2024-04-17 NOTE — Progress Notes (Signed)
 "   Chief Complaint  Patient presents with   Follow-up    CAD   History of Present Illness: 59 yo male with history of CAD, HTN, HLD, GERD and tobacco abuse who is here today for follow up. He was admitted with a late presenting STEMI in September 2024. Cardiac cath with occlusion of the distal RCA which was not opened due to late presentation. There were left to right collaterals. Echo September 2024 with LVEF=60-65%.   He is here today for follow up. The patient denies any chest pain, dyspnea, palpitations, lower extremity edema, orthopnea, PND, dizziness, near syncope or syncope. His blood pressure is high some evenings.   Primary Care Physician: Theotis Haze ORN, NP   Past Medical History:  Diagnosis Date   CAD (coronary artery disease)    Chronic back pain    Chronic sinusitis    nasal polyps, turbinate hypertrophy, deviated septum   Erectile dysfunction    Goiter    Herpes simplex    Hypercholesteremia    Hypertension    MI (myocardial infarction) (HCC)    Perirectal abscess    Skin abscess    Thyroid  disease    Tobacco dependence     Past Surgical History:  Procedure Laterality Date   ARM HARDWARE REMOVAL Right    INCISION AND DRAINAGE PERIRECTAL ABSCESS N/A 07/22/2017   Procedure: IRRIGATION, DEBRIDEMENT AND DRAINAGE PERIRECTAL ABSCESS;  Surgeon: Tanda Locus, MD;  Location: [facility] OR;  Service: General;  Laterality: N/A;   LEFT HEART CATH AND CORONARY ANGIOGRAPHY N/A 12/05/2022   Procedure: LEFT HEART CATH AND CORONARY ANGIOGRAPHY;  Surgeon: Darron Deatrice LABOR, MD;  Location: [facility] INVASIVE CV LAB;  Service: Cardiovascular;  Laterality: N/A;   NASAL SEPTOPLASTY W/ TURBINOPLASTY Bilateral 11/13/2019   Procedure: NASAL SEPTOPLASTY WITH TURBINATE REDUCTION;  Surgeon: Carlie Clark, MD;  Location: [facility] OR;  Service: ENT;  Laterality: Bilateral;   NECK SURGERY     PAROTIDECTOMY     Dr Carlie   SINUS ENDO WITH FUSION Bilateral 11/13/2019   Procedure: SINUS ENDO WITH FUSION;  Surgeon:  Carlie Clark, MD;  Location: [facility] OR;  Service: ENT;  Laterality: Bilateral;   THYROIDECTOMY, PARTIAL  2008   Dr Curvin    Current Outpatient Medications  Medication Sig Dispense Refill   aspirin  81 MG tablet Take 1 tablet (81 mg total) by mouth daily. 90 tablet 3   atorvastatin  (LIPITOR) 40 MG tablet Take 1 tablet (40 mg total) by mouth daily. 90 tablet 1   carbamide peroxide (DEBROX) 6.5 % OTIC solution Place 5 drops into the right ear 2 (two) times daily. 15 mL 3   famotidine  (PEPCID ) 40 MG tablet Take 1 tablet (40 mg total) by mouth daily. For acid reflux 90 tablet 1   metoprolol  succinate (TOPROL  XL) 25 MG 24 hr tablet Take 1 tablet (25 mg total) by mouth 2 (two) times daily. 180 tablet 3   naloxone  (NARCAN ) nasal spray 4 mg/0.1 mL Place 1 spray into the nose as needed. 2 each 0   nitroGLYCERIN  (NITROSTAT ) 0.4 MG SL tablet Place 1 tablet (0.4 mg total) under the tongue every 5 (five) minutes as needed. 25 tablet 1   Oxycodone  HCl 20 MG TABS Take 1 tablet (20 mg total) by mouth every 4 (four) hours as needed. 180 tablet 0   pregabalin  (LYRICA ) 150 MG capsule Take 1 capsule (150 mg total) by mouth 2 (two) times daily. 60 capsule 2   traZODone  (DESYREL ) 100 MG tablet Take 1  tablet (100 mg total) by mouth at bedtime. FOR SLEEP 90 tablet 1   valACYclovir  (VALTREX ) 1000 MG tablet Take 1 tablet (1,000 mg total) by mouth daily. 90 tablet 3   naloxone  (NARCAN ) nasal spray 4 mg/0.1 mL Instill 1 spray in the nostril as needed for emergency overdose. 2 each 1   naloxone  (NARCAN ) nasal spray 4 mg/0.1 mL Place 1 spray into the nose as needed. 2 each 1   naloxone  (NARCAN ) nasal spray 4 mg/0.1 mL Place 1 spray into the nose as needed. 2 each 0   Oxycodone  HCl 20 MG TABS Take 1 tablet (20 mg total) by mouth every 4 (four) hours as needed. 180 tablet 0   pregabalin  (LYRICA ) 150 MG capsule Take 1 capsule (150 mg total) by mouth 2 (two) times daily. 60 capsule 2   pregabalin  (LYRICA ) 150 MG capsule Take 1  capsule (150 mg total) by mouth 2 (two) times daily. 60 capsule 2   pregabalin  (LYRICA ) 150 MG capsule Take 1 capsule (150 mg total) by mouth 2 (two) times daily. 60 capsule 2   pregabalin  (LYRICA ) 150 MG capsule Take 1 capsule (150 mg total) by mouth 2 (two) times daily. 60 capsule 2   No current facility-administered medications for this visit.    Allergies[1]  Social History   Socioeconomic History   Marital status: Married    Spouse name: Not on file   Number of children: 0   Years of education: Not on file   Highest education level: 12th grade  Occupational History   Not on file  Tobacco Use   Smoking status: Every Day    Current packs/day: 3.00    Average packs/day: 3.0 packs/day for 22.0 years (66.0 ttl pk-yrs)    Types: Cigarettes   Smokeless tobacco: Never   Tobacco comments:    3 to 4 cigs a day  Vaping Use   Vaping status: Never Used  Substance and Sexual Activity   Alcohol use: No    Alcohol/week: 0.0 standard drinks of alcohol   Drug use: No   Sexual activity: Yes  Other Topics Concern   Not on file  Social History Narrative   ** Merged History Encounter **       Social Drivers of Health   Tobacco Use: High Risk (04/17/2024)   Patient History    Smoking Tobacco Use: Every Day    Smokeless Tobacco Use: Never    Passive Exposure: Not on file  Financial Resource Strain: Low Risk (04/16/2024)   Overall Financial Resource Strain (CARDIA)    Difficulty of Paying Living Expenses: Not hard at all  Food Insecurity: No Food Insecurity (04/16/2024)   Epic    Worried About Radiation Protection Practitioner of Food in the Last Year: Never true    Ran Out of Food in the Last Year: Never true  Transportation Needs: No Transportation Needs (04/16/2024)   Epic    Lack of Transportation (Medical): No    Lack of Transportation (Non-Medical): No  Physical Activity: Patient Declined (04/15/2023)   Exercise Vital Sign    Days of Exercise per Week: Patient declined    Minutes of Exercise per  Session: Patient declined  Stress: No Stress Concern Present (04/15/2023)   [employer] of Occupational Health - Occupational Stress Questionnaire    Feeling of Stress : Not at all  Social Connections: Moderately Integrated (04/16/2024)   Social Connection and Isolation Panel    Frequency of Communication with Friends and Family: More than three times  a week    Frequency of Social Gatherings with Friends and Family: More than three times a week    Attends Religious Services: More than 4 times per year    Active Member of [employer] or Organizations: No    Attends Banker Meetings: Not on file    Marital Status: Married  Catering Manager Violence: Not At Risk (12/05/2022)   Humiliation, Afraid, Rape, and Kick questionnaire    Fear of Current or Ex-Partner: No    Emotionally Abused: No    Physically Abused: No    Sexually Abused: No  Depression (PHQ2-9): Low Risk (01/21/2024)   Depression (PHQ2-9)    PHQ-2 Score: 0  Alcohol Screen: Low Risk (04/16/2024)   Alcohol Screen    Last Alcohol Screening Score (AUDIT): 0  Housing: Low Risk (04/16/2024)   Epic    Unable to Pay for Housing in the Last Year: No    Number of Times Moved in the Last Year: 0    Homeless in the Last Year: No  Utilities: Not At Risk (12/05/2022)   AHC Utilities    Threatened with loss of utilities: No  Health Literacy: Adequate Health Literacy (04/15/2023)   B1300 Health Literacy    Frequency of need for help with medical instructions: Never    Family History  Problem Relation Age of Onset   Stroke Mother    Stroke Father    Colon cancer Neg Hx    Stomach cancer Neg Hx    Esophageal cancer Neg Hx     Review of Systems:  As stated in the HPI and otherwise negative.   BP 120/64   Pulse 80   Ht 6' (1.829 m)   Wt 197 lb (89.4 kg)   SpO2 97%   BMI 26.72 kg/m   Physical Examination: General: Well developed, well nourished, NAD  HEENT: OP clear, mucus membranes moist  SKIN: warm, dry. No  rashes. Neuro: No focal deficits  Musculoskeletal: Muscle strength 5/5 all ext  Psychiatric: Mood and affect normal  Neck: No JVD, no carotid bruits, no thyromegaly, no lymphadenopathy.  Lungs:Clear bilaterally, no wheezes, rhonci, crackles Cardiovascular: Regular rate and rhythm. No murmurs, gallops or rubs. Abdomen:Soft. Bowel sounds present. Non-tender.  Extremities: No lower extremity edema. Pulses are 2 + in the bilateral DP/PT.  EKG:  EKG is not ordered today. The ekg ordered today demonstrates   Recent Labs: 07/16/2023: Hemoglobin 14.1; Platelets 204 01/21/2024: ALT 11; BUN 10; Creatinine, Ser 0.81; Potassium 4.6; Sodium 139   Lipid Panel    Component Value Date/Time   CHOL 119 12/05/2022 0600   CHOL 178 09/26/2021 1113   TRIG 69 12/05/2022 0600   HDL 47 12/05/2022 0600   HDL 35 (L) 09/26/2021 1113   CHOLHDL 2.5 12/05/2022 0600   VLDL 14 12/05/2022 0600   LDLCALC 58 12/05/2022 0600   LDLCALC 101 (H) 09/26/2021 1113    Wt Readings from Last 3 Encounters:  04/17/24 197 lb (89.4 kg)  01/30/24 194 lb 6.4 oz (88.2 kg)  01/21/24 193 lb 9.6 oz (87.8 kg)    Assessment and Plan:   1. CAD without angina: No chest pain.  -Continue ASA, Lipitor and Toprol   2. HTN: BP is well controlled today but is high some nights.  -Increase Toprol  to 25 mg po BID  3. HLD: LDL 58 in September 2024.  -Continue Lipitor -Repeat lipids and LFTs now  4. Tobacco abuse: Smoking cessation is advised.   Labs/ tests ordered today include:  Orders Placed This Encounter  Procedures   Lipid Profile   Hepatic function panel   Disposition:   F/U with me in 12 months   Signed, Lonni Cash, MD, [facility] 04/17/2024 11:41 AM    [facility] [address] [place], [place], [place]  [address] Phone: [phone]; Fax: [phone]       [1]  Allergies Allergen Reactions   Porcine (Pork) Protein-Containing Drug Products Other (See Comments)    No pork products; pt is  a Muslim   "

## 2024-04-20 ENCOUNTER — Other Ambulatory Visit: Payer: Self-pay

## 2024-04-20 ENCOUNTER — Other Ambulatory Visit (HOSPITAL_COMMUNITY): Payer: Self-pay

## 2024-06-08 ENCOUNTER — Ambulatory Visit: Admitting: Nurse Practitioner
